# Patient Record
Sex: Male | Born: 1935 | Race: White | Hispanic: No | Smoking: Former smoker
Health system: Southern US, Community
[De-identification: ages and names within clinical notes are randomized; demographics above are authoritative.]

## PROBLEM LIST (undated history)

## (undated) DIAGNOSIS — I2510 Atherosclerotic heart disease of native coronary artery without angina pectoris: Secondary | ICD-10-CM

## (undated) DIAGNOSIS — M1712 Unilateral primary osteoarthritis, left knee: Secondary | ICD-10-CM

## (undated) DIAGNOSIS — R279 Unspecified lack of coordination: Secondary | ICD-10-CM

## (undated) DIAGNOSIS — I739 Peripheral vascular disease, unspecified: Secondary | ICD-10-CM

## (undated) DIAGNOSIS — Z87442 Personal history of urinary calculi: Secondary | ICD-10-CM

## (undated) DIAGNOSIS — K5900 Constipation, unspecified: Secondary | ICD-10-CM

## (undated) DIAGNOSIS — C44211 Basal cell carcinoma of skin of unspecified ear and external auricular canal: Secondary | ICD-10-CM

## (undated) DIAGNOSIS — Z96659 Presence of unspecified artificial knee joint: Principal | ICD-10-CM

## (undated) DIAGNOSIS — R200 Anesthesia of skin: Secondary | ICD-10-CM

## (undated) DIAGNOSIS — E039 Hypothyroidism, unspecified: Secondary | ICD-10-CM

## (undated) DIAGNOSIS — F4323 Adjustment disorder with mixed anxiety and depressed mood: Secondary | ICD-10-CM

## (undated) DIAGNOSIS — R413 Other amnesia: Secondary | ICD-10-CM

## (undated) DIAGNOSIS — R269 Unspecified abnormalities of gait and mobility: Secondary | ICD-10-CM

## (undated) DIAGNOSIS — F4321 Adjustment disorder with depressed mood: Secondary | ICD-10-CM

## (undated) DIAGNOSIS — D62 Acute posthemorrhagic anemia: Secondary | ICD-10-CM

## (undated) DIAGNOSIS — E7800 Pure hypercholesterolemia, unspecified: Secondary | ICD-10-CM

## (undated) DIAGNOSIS — R531 Weakness: Secondary | ICD-10-CM

## (undated) DIAGNOSIS — R42 Dizziness and giddiness: Secondary | ICD-10-CM

## (undated) DIAGNOSIS — I69319 Unspecified symptoms and signs involving cognitive functions following cerebral infarction: Secondary | ICD-10-CM

## (undated) DIAGNOSIS — E785 Hyperlipidemia, unspecified: Secondary | ICD-10-CM

## (undated) DIAGNOSIS — R5383 Other fatigue: Secondary | ICD-10-CM

## (undated) DIAGNOSIS — I10 Essential (primary) hypertension: Secondary | ICD-10-CM

## (undated) HISTORY — DX: Peripheral vascular disease, unspecified: I73.9

## (undated) HISTORY — DX: Essential (primary) hypertension: I10

## (undated) HISTORY — DX: Dizziness and giddiness: R42

## (undated) HISTORY — DX: Weakness: R53.1

## (undated) HISTORY — DX: Atherosclerotic heart disease of native coronary artery without angina pectoris: I25.10

## (undated) HISTORY — DX: Other amnesia: R41.3

## (undated) HISTORY — DX: Unspecified abnormalities of gait and mobility: R26.9

## (undated) HISTORY — DX: Unspecified symptoms and signs involving cognitive functions following cerebral infarction: I69.319

## (undated) HISTORY — DX: Hypothyroidism, unspecified: E03.9

## (undated) HISTORY — DX: Adjustment disorder with depressed mood: F43.21

## (undated) HISTORY — PX: CARPAL TUNNEL RELEASE: SHX101

## (undated) HISTORY — PX: CYSTOSCOPY: SHX5120

## (undated) HISTORY — DX: Pure hypercholesterolemia, unspecified: E78.00

## (undated) HISTORY — DX: Acute posthemorrhagic anemia: D62

## (undated) HISTORY — DX: Other fatigue: R53.83

## (undated) HISTORY — DX: Unilateral primary osteoarthritis, left knee: M17.12

## (undated) HISTORY — DX: Hyperlipidemia, unspecified: E78.5

## (undated) HISTORY — PX: TONSILLECTOMY: SHX5217

## (undated) HISTORY — DX: Basal cell carcinoma of skin of unspecified ear and external auricular canal: C44.211

## (undated) HISTORY — PX: TONSILLECTOMY: SUR1361

## (undated) HISTORY — DX: Anesthesia of skin: R20.0

## (undated) HISTORY — DX: Constipation, unspecified: K59.00

## (undated) HISTORY — DX: Unspecified lack of coordination: R27.9

## (undated) HISTORY — DX: Adjustment disorder with mixed anxiety and depressed mood: F43.23

## (undated) HISTORY — DX: Presence of unspecified artificial knee joint: Z96.659

---

## 1998-09-07 ENCOUNTER — Encounter: Payer: Self-pay | Admitting: Urology

## 1998-09-08 ENCOUNTER — Observation Stay (HOSPITAL_COMMUNITY): Admission: RE | Admit: 1998-09-08 | Discharge: 1998-09-09 | Payer: Self-pay | Admitting: Urology

## 1999-12-07 ENCOUNTER — Encounter: Admission: RE | Admit: 1999-12-07 | Discharge: 2000-03-06 | Payer: Self-pay | Admitting: Radiation Oncology

## 2000-02-28 ENCOUNTER — Ambulatory Visit (HOSPITAL_COMMUNITY): Admission: RE | Admit: 2000-02-28 | Discharge: 2000-02-28 | Payer: Self-pay | Admitting: Radiation Oncology

## 2000-03-07 ENCOUNTER — Encounter: Admission: RE | Admit: 2000-03-07 | Discharge: 2000-06-05 | Payer: Self-pay | Admitting: Radiation Oncology

## 2000-10-02 HISTORY — PX: CHOLECYSTECTOMY: SHX55

## 2001-05-14 ENCOUNTER — Emergency Department (HOSPITAL_COMMUNITY): Admission: EM | Admit: 2001-05-14 | Discharge: 2001-05-14 | Payer: Self-pay | Admitting: Emergency Medicine

## 2001-05-14 ENCOUNTER — Encounter: Payer: Self-pay | Admitting: Emergency Medicine

## 2001-05-17 ENCOUNTER — Encounter (INDEPENDENT_AMBULATORY_CARE_PROVIDER_SITE_OTHER): Payer: Self-pay | Admitting: *Deleted

## 2001-05-18 ENCOUNTER — Inpatient Hospital Stay (HOSPITAL_COMMUNITY): Admission: RE | Admit: 2001-05-18 | Discharge: 2001-05-19 | Payer: Self-pay | Admitting: *Deleted

## 2001-10-02 HISTORY — PX: CORONARY ARTERY BYPASS GRAFT: SHX141

## 2002-06-09 ENCOUNTER — Encounter (HOSPITAL_COMMUNITY): Admission: RE | Admit: 2002-06-09 | Discharge: 2002-09-07 | Payer: Self-pay | Admitting: Cardiovascular Disease

## 2007-09-19 ENCOUNTER — Ambulatory Visit (HOSPITAL_COMMUNITY): Admission: RE | Admit: 2007-09-19 | Discharge: 2007-09-19 | Payer: Self-pay | Admitting: General Surgery

## 2007-09-19 ENCOUNTER — Encounter (INDEPENDENT_AMBULATORY_CARE_PROVIDER_SITE_OTHER): Payer: Self-pay | Admitting: General Surgery

## 2007-09-19 HISTORY — PX: INGUINAL HERNIA REPAIR: SUR1180

## 2009-12-13 ENCOUNTER — Ambulatory Visit (HOSPITAL_BASED_OUTPATIENT_CLINIC_OR_DEPARTMENT_OTHER): Admission: RE | Admit: 2009-12-13 | Discharge: 2009-12-13 | Payer: Self-pay | Admitting: Otolaryngology

## 2009-12-14 HISTORY — PX: EAR CYST EXCISION: SHX22

## 2010-01-14 ENCOUNTER — Ambulatory Visit (HOSPITAL_COMMUNITY): Admission: RE | Admit: 2010-01-14 | Discharge: 2010-01-14 | Payer: Self-pay | Admitting: Otolaryngology

## 2010-01-14 HISTORY — PX: BASAL CELL CARCINOMA EXCISION: SHX1214

## 2010-06-08 ENCOUNTER — Ambulatory Visit: Payer: Self-pay | Admitting: Cardiovascular Disease

## 2010-12-13 ENCOUNTER — Ambulatory Visit (INDEPENDENT_AMBULATORY_CARE_PROVIDER_SITE_OTHER): Payer: Medicare Other | Admitting: Cardiovascular Disease

## 2010-12-13 DIAGNOSIS — I10 Essential (primary) hypertension: Secondary | ICD-10-CM

## 2010-12-13 DIAGNOSIS — Z951 Presence of aortocoronary bypass graft: Secondary | ICD-10-CM

## 2010-12-13 DIAGNOSIS — E7800 Pure hypercholesterolemia, unspecified: Secondary | ICD-10-CM

## 2010-12-13 DIAGNOSIS — I2510 Atherosclerotic heart disease of native coronary artery without angina pectoris: Secondary | ICD-10-CM

## 2010-12-21 LAB — BASIC METABOLIC PANEL
BUN: 21 mg/dL (ref 6–23)
CO2: 26 mEq/L (ref 19–32)
Calcium: 9.2 mg/dL (ref 8.4–10.5)
Chloride: 107 mEq/L (ref 96–112)
Creatinine, Ser: 0.89 mg/dL (ref 0.4–1.5)
GFR calc Af Amer: >60 mL/min (ref >60)
GFR calc non Af Amer: >60 mL/min (ref >60)
Glucose, Bld: 114 mg/dL — ABNORMAL HIGH (ref 70–99)
Potassium: 4.4 mEq/L (ref 3.5–5.1)
Sodium: 139 mEq/L (ref 135–145)

## 2010-12-21 LAB — CBC
HCT: 40.7 % (ref 39.0–52.0)
Hemoglobin: 14.1 g/dL (ref 13.0–17.0)
MCHC: 34.7 g/dL (ref 30.0–36.0)
MCV: 95.8 fL (ref 78.0–100.0)
RBC: 4.25 MIL/uL (ref 4.22–5.81)
RDW: 13.1 % (ref 11.5–15.5)

## 2010-12-22 ENCOUNTER — Telehealth: Payer: Self-pay | Admitting: Cardiovascular Disease

## 2010-12-22 NOTE — Telephone Encounter (Signed)
Returned pts call regarding his last glucose and cholesterol results.  RN left this info on his answering machine per his request.

## 2010-12-22 NOTE — Telephone Encounter (Signed)
Patient called and requested last glucose result and last cholesterol result.  OK to leave message on answering machine, patient may be out.

## 2011-01-17 ENCOUNTER — Other Ambulatory Visit: Payer: Self-pay | Admitting: Cardiovascular Disease

## 2011-01-17 DIAGNOSIS — E785 Hyperlipidemia, unspecified: Secondary | ICD-10-CM

## 2011-01-17 MED ORDER — PRAVASTATIN SODIUM 40 MG PO TABS: 40.0000 mg | ORAL_TABLET | Freq: Every day | ORAL | Status: DC

## 2011-01-17 NOTE — Telephone Encounter (Signed)
Spoke with pt, new strength ordered.Patient request refill. Completed. Alfonso Ramus RN

## 2011-01-17 NOTE — Telephone Encounter (Signed)
PT NEEDING TO REFILL A MED BUT HAS QUESTIONS ABOUT THE DOSAGE AND HAVING ENOUGH. CHART PLACED IN BOX.

## 2011-02-14 NOTE — Op Note (Signed)
NAMEJAHRON, HUNSINGER              ACCOUNT NO.:  [id number]   MEDICAL RECORD NO.:  [id number]          PATIENT TYPE:  AMB   LOCATION:  SDS                          FACILITY:  [facility]   PHYSICIAN:  Ollen Gross. Vernell Morgans, M.D. DATE OF BIRTH:  Apr 09, 1921   DATE OF PROCEDURE:  09/19/2007  DATE OF DISCHARGE:                               OPERATIVE REPORT   PREOPERATIVE DIAGNOSES:  Right inguinal hernia.   POSTOPERATIVE DIAGNOSIS:  Right indirect inguinal hernia.   PROCEDURE:  Right inguinal hernia repair with mesh.   SURGEON:  Ollen Gross. Vernell Morgans, M.D.   ANESTHESIA:  General via LMA.   PROCEDURE:  After informed consent was obtained, the patient brought to  the operating room, placed in supine position on operating room table.  After adequate induction of general anesthesia, the patient's abdomen  and right groin were prepped with Betadine and draped in usual sterile  fashion.  The right groin was then infiltrated with 0.25% Marcaine with  epinephrine.  A small incision was made from the edge of pubic tubercle  on the right towards the anterior superior iliac spine.  This incision  was carried down through the skin and subcutaneous tissue sharply with  electrocautery until the fascia of the external oblique was encountered.  Fascia of the external oblique was opened along its fibers towards the  apex of the external ring with a 10 blade knife and Metzenbaum scissors.  A Weitlaner retractor was then deployed.  Blunt dissection was then  carried out of the cord structures until they could be surrounded  between two fingers.  A 1/2-inch Penrose drain was placed around the  cord structures for retraction purposes.  There was did not appear to be  a direct defect in the floor of the canal.  The cord structures were  then gently skeletonized, until a hernia sac was identified.  It was  gently separated from the rest of the cord structures.  The sac was then  opened.  There were no visceral contents  within the sac.  The sac was  ligated at its base with a 2-0 silk suture ligature and the stump of the  sac was allowed to retract back beneath the transversalis.  The patient  also a moderate sized lipoma that was separated from the rest of cord  structures sharply with electrocautery.  Next the ilioinguinal nerve was  also identified and was clamped proximally and distally with hemostats,  divided and ligated with 3-0 silk ties.  Next a 3 x 6 piece of Ultrapro  mesh was chosen and cut to fit.  The mesh was sewed inferiorly to the  shelving edge of inguinal ligament with a running 2-0 Prolene stitch.  The mesh was sewed superiorly to the muscular aponeurotic strength layer  of the transversalis.  Tails were cut in the mesh laterally and tails of  mesh were wrapped around the cord structures and anchored lateral to the  cord, to the shelving edge of inguinal ligament interrupted 2-0 Prolene  stitch.  Once this was accomplished, the mesh was in good position  without any  tension.  The wound was irrigated copious amounts of saline.  The external oblique was then reapproximated a running 2-0 Vicryl  stitch.  The wound was infiltrated with the rest of the 0.25% Marcaine.  The subcutaneous fascia was closed with a running 3-0 Vicryl stitch and  the skin was closed with a running 4-0 Monocryl subcuticular stitch.  A  Dermabond dressing was then applied.  The patient tolerated procedure  well.  At the end the case all needle, sponge, instrument counts  correct.  The patient was awakened and taken recovery in stable  condition.  The patient's testicle was in the scrotum at the end the  case.      Ollen Gross. Vernell Morgans, M.D.  Electronically Signed     PST/MEDQ  D:  09/19/2007  T:  09/19/2007  Job:  [id number]

## 2011-02-17 NOTE — Discharge Summary (Signed)
[place]  Patient:    William Roach, William Roach Visit Number: [id number] MRN: [id number]          Service Type: SUR Location: [id number] Attending Physician:  Vikki Ports. Adm. Date:  [id number] Disc. Date: [id number]                             Discharge Summary  ADMISSION DIAGNOSES:  Acute cholecystitis.  DISCHARGE DIAGNOSIS:  Acute gangrenous cholecystitis.  ADMITTING PHYSICIAN:  Vikki Ports, M.D.  CONDITION ON DISCHARGE:  Good and improved.  DISPOSITION:  Discharge to home.  FOLLOWUP:  Follow up with me in 5 days.  BRIEF HISTORY OF PRESENT ILLNESS: The patient is a 77 year old white male with a 5 day history of worsening right upper quadrant abdominal pain and low-grade fever.  Ultrasound showed a thick walled gallbladder.  He was seen in the office 2 days prior to admission.  The patient was quite tender and admitted for laparoscopic cholecystectomy.  For the remainder of the H&P please see the chart.  HOSPITAL COURSE:  The patient was admitted, taken to the operating room where he underwent laparoscopic cholecystectomy for acute gangrenous cholecystitis and placement of a JP drain.  Postoperatively he spiked a temperature to 102 and was maintained on IV antibiotics for 48 hours.  On postoperative day #2, he had remained afebrile for 36 hours, was tolerating a regular diet, feeling well and anxious to go home.  DISCHARGE MEDICATIONs: 1. Mepergan Fortis 1-2 p.o. q.4-6h. p.r.n. 2. Cipro 500 mg p.o. b.i.d. for 3 days. DD:  05/19/01 TD:  05/20/01 Job: [id number]

## 2011-02-17 NOTE — Op Note (Signed)
[place]. [facility]  Patient:    SUMMER, PARTHASARATHY                               Visit Number: [id number] MRN: [id number]          Service Type: DSU Location: 5700 [id number] Attending:  Vikki Ports Proc. Date: 05/17/01 Adm. Date:  [id number]                             Operative Report  PREOPERATIVE DIAGNOSIS:  Acute cholecystitis.  POSTOPERATIVE DIAGNOSIS:  Acute gangrenous cholecystitis.  PROCEDURE:  Laparoscopic cholecystectomy.  SURGEON:  Vikki Ports, M.D.  ASSISTANT:  Zigmund Daniel, M.D.  ANESTHESIA:  General anesthesia, endotracheal tube.  DESCRIPTION OF PROCEDURE:  Patient was taken to the operating room and placed in a supine position.  After adequate anesthesia was induced using endotracheal tube, the abdomen was prepped and draped in the normal sterile fashion.  Using a transverse infraumbilical incision, I dissected down to the fascia, which was opened vertically.  An 0 Vicryl pursestring suture was placed around the fascial defect, and the Hasson trocar was placed in the abdomen.  Abdomen was insufflated with carbon dioxide to a pressure of 15 mmHg. There was a large number of omental adhesions to the anterior abdominal wall. There was what appeared to be bilious fluid in the right upper quadrant.  The gallbladder was identified, was grossly necrotic at the fundus and gangrenous throughout with marked inflammatory changes.  Under direct visualization, a 10 mm port was placed in the subxiphoid region, two 5 mm ports were placed in the right abdomen.  The gallbladder was retracted cephalad.  The gallbladder, particularly the fundus, shredded while being grasped and maneuvered.  Tedious dissection was taken down near the fatty planes of the neck of the gallbladder, duodenum was freed up.  The cystic duct, which was very thin and friable, was doubly clipped; however, I do not feel the clips were very securely placed.   However, on further attempts to grasp the cystic duct, it continued to be quite friable and I felt it prudent not to continue manipulation.  Cystic artery was identified, dissected free, triply clipped and divided.  It was difficult dissection remove the gallbladder off the gallbladder bed, but it was accomplished with Bovie electrocautery.  There was significant bleeding from the liver bed, which was coagulated, and Surgicel was placed.  The gallbladder was placed in an Endocatch bag and removed to the umbilical port.  A 19 Blake drain was placed down near the cystic duct and brought out through a lateral side port.  Abdomen was allowed to deflate after the right upper quadrant was copiously irrigated.  Fascial defects were closed with 0 Vicryl pursestring suture.  Skin incisions were closed with subcuticular 4-0 Monocryl.  Steri-Strips, sterile dressings were applied.  The patient tolerated the procedure well and went to PACU in good condition. DD:  05/17/01 TD:  05/17/01 Job: [id number]

## 2011-04-12 ENCOUNTER — Other Ambulatory Visit: Payer: Self-pay | Admitting: Family Medicine

## 2011-06-22 ENCOUNTER — Encounter: Payer: Self-pay | Admitting: *Deleted

## 2011-06-28 ENCOUNTER — Ambulatory Visit (INDEPENDENT_AMBULATORY_CARE_PROVIDER_SITE_OTHER): Payer: Medicare Other | Admitting: *Deleted

## 2011-06-28 DIAGNOSIS — I2510 Atherosclerotic heart disease of native coronary artery without angina pectoris: Secondary | ICD-10-CM

## 2011-06-28 DIAGNOSIS — I10 Essential (primary) hypertension: Secondary | ICD-10-CM

## 2011-06-28 DIAGNOSIS — R5383 Other fatigue: Secondary | ICD-10-CM

## 2011-06-28 DIAGNOSIS — E7800 Pure hypercholesterolemia, unspecified: Secondary | ICD-10-CM

## 2011-06-28 LAB — BASIC METABOLIC PANEL
BUN: 23 mg/dL (ref 6–23)
CO2: 29 mEq/L (ref 19–32)
Calcium: 9.2 mg/dL (ref 8.4–10.5)
Creatinine, Ser: 0.9 mg/dL (ref 0.4–1.5)
Glucose, Bld: 98 mg/dL (ref 70–99)
Sodium: 142 mEq/L (ref 135–145)

## 2011-06-28 LAB — HEPATIC FUNCTION PANEL
AST: 27 U/L (ref 0–37)
Bilirubin, Direct: 0.1 mg/dL (ref 0.0–0.3)
Total Bilirubin: 0.9 mg/dL (ref 0.3–1.2)

## 2011-06-28 LAB — LIPID PANEL
HDL: 43.1 mg/dL (ref >39.00)
Total CHOL/HDL Ratio: 3

## 2011-07-04 ENCOUNTER — Encounter: Payer: Self-pay | Admitting: Cardiovascular Disease

## 2011-07-04 ENCOUNTER — Ambulatory Visit (INDEPENDENT_AMBULATORY_CARE_PROVIDER_SITE_OTHER): Payer: Medicare Other | Admitting: Cardiovascular Disease

## 2011-07-04 DIAGNOSIS — E785 Hyperlipidemia, unspecified: Secondary | ICD-10-CM | POA: Insufficient documentation

## 2011-07-04 DIAGNOSIS — I2510 Atherosclerotic heart disease of native coronary artery without angina pectoris: Secondary | ICD-10-CM

## 2011-07-04 DIAGNOSIS — I10 Essential (primary) hypertension: Secondary | ICD-10-CM

## 2011-07-04 HISTORY — DX: Essential (primary) hypertension: I10

## 2011-07-04 HISTORY — DX: Atherosclerotic heart disease of native coronary artery without angina pectoris: I25.10

## 2011-07-04 HISTORY — DX: Hyperlipidemia, unspecified: E78.5

## 2011-07-04 NOTE — Assessment & Plan Note (Signed)
His blood pressure remains very  well controlled. He'll continue to watch it we'll check each week.

## 2011-07-04 NOTE — Progress Notes (Signed)
Karen Kitchens Date of Birth  Aug 13, 1921 [facility] [address]    [address] [place], [place]  [address] [phone]  Fax  [phone]  History of Present Illness:  77 year old gentleman with history history of coronary artery disease-status post coronary artery bypass grafting. He also has a history of hypertension, hyperlipidemia.  Current Outpatient Prescriptions on File Prior to Visit  Medication Sig Dispense Refill  . Calcium Carbonate-Vit D-Min (CALTRATE PLUS PO) Take 1 tablet by mouth daily.        . Glucosamine-Chondroitin (OSTEO BI-FLEX REGULAR STRENGTH PO) Take 1 tablet by mouth 2 (two) times daily.        Marland Kitchen ibuprofen (ADVIL) 200 MG tablet Take 200 mg by mouth as needed.        . multivitamin (THERAGRAN) per tablet Take 1 tablet by mouth daily.        . pravastatin (PRAVACHOL) 40 MG tablet Take 1 tablet (40 mg total) by mouth daily.  90 tablet  3  . vitamin C (ASCORBIC ACID) 500 MG tablet Take 1,000 mg by mouth daily.          No Known Allergies  Past Medical History  Diagnosis Date  . Coronary artery disease     post CABG x4   . Hypertension   . Hypercholesterolemia   . Fatigue   . Dizziness   . Basal cell carcinoma, ear     post -- partial pinnectomy left side with primary closure      Past Surgical History  Procedure Date  . Coronary artery bypass graft 2003    x4 --   . Cholecystectomy 2002  . Inguinal hernia repair 09/19/2007  . Ear cyst excision 12/14/2009    Excision of left auricular cyst with primary closure  . Basal cell carcinoma excision 01/14/2010    Wedge resection, partial pinnectomy left side with primary closure      . Tonsillectomy   . Carpal tunnel release     History  Smoking status  . Former Smoker  . Quit date: 06/21/1981  Smokeless tobacco  . Not on file    History  Alcohol Use No    Family History  Problem Relation Age of Onset  . Parkinsonism Father   . Angina Mother   . Coronary artery disease Brother       with CABG    Reviw of Systems:  Reviewed in the HPI.  All other systems are negative.  Physical Exam: BP 130/80  Pulse 68  Ht 5\' 7"  (1.702 m)  Wt 155 lb 1.9 oz (70.362 kg)  BMI 24.30 kg/m2 The patient is alert and oriented x 3.  The mood and affect are normal.   Skin: warm and dry.  Color is normal.    HEENT:   the sclera are nonicteric.  The mucous membranes are moist.  The carotids are 2+ without bruits.  There is no thyromegaly.  There is no JVD.    Lungs: clear.  The chest wall is non tender.    Heart: regular rate with a normal S1 and S2.  There are no murmurs, gallops, or rubs. The PMI is not displaced.     Abdomen: good bowel sounds.  There is no guarding or rebound.  There is no hepatosplenomegaly or tenderness.  There are no masses.   Extremities:  no clubbing, cyanosis, or edema.  The legs are without rashes.  The distal pulses are intact.   Neuro:  Cranial nerves II - XII  are intact.  Motor and sensory functions are intact.    The gait is normal.  ECG:  Assessment / Plan:

## 2011-07-04 NOTE — Patient Instructions (Signed)
Your physician wants you to follow-up in:6 months You will receive a reminder letter in the mail two months in advance. If you don't receive a letter, please call our office to schedule the follow-up appointment.  Your physician recommends that you continue on your current medications as directed. Please refer to the Current Medication list given to you today.   Your physician recommends that you return for lab work in: 6 months/ fasting labs

## 2011-07-04 NOTE — Assessment & Plan Note (Signed)
He has done very well. He's not had any episodes of chest pain or shortness of breath.

## 2011-07-04 NOTE — Assessment & Plan Note (Signed)
We'll see him again in 6 months for an office visit and fasting lipid profile, H&P, and basic metabolic profile.

## 2011-07-07 LAB — BASIC METABOLIC PANEL
CO2: 31
Chloride: 102
Creatinine, Ser: 1
GFR calc Af Amer: >60
Glucose, Bld: 73
Sodium: 138

## 2011-07-07 LAB — CBC
Hemoglobin: 14.3
MCHC: 34
MCV: 93.2
RBC: 4.53

## 2011-12-09 ENCOUNTER — Other Ambulatory Visit: Payer: Self-pay | Admitting: Cardiovascular Disease

## 2012-01-02 ENCOUNTER — Encounter: Payer: Self-pay | Admitting: Cardiovascular Disease

## 2012-01-02 ENCOUNTER — Ambulatory Visit (INDEPENDENT_AMBULATORY_CARE_PROVIDER_SITE_OTHER): Payer: Medicare Other | Admitting: Cardiovascular Disease

## 2012-01-02 VITALS — BP 125/65 | HR 68 | Ht 68.0 in | Wt 151.8 lb

## 2012-01-02 DIAGNOSIS — I2510 Atherosclerotic heart disease of native coronary artery without angina pectoris: Secondary | ICD-10-CM

## 2012-01-02 DIAGNOSIS — E785 Hyperlipidemia, unspecified: Secondary | ICD-10-CM

## 2012-01-02 DIAGNOSIS — R9431 Abnormal electrocardiogram [ECG] [EKG]: Secondary | ICD-10-CM

## 2012-01-02 DIAGNOSIS — I10 Essential (primary) hypertension: Secondary | ICD-10-CM

## 2012-01-02 NOTE — Assessment & Plan Note (Signed)
William Roach feels fairly well. His EKG today shows T-wave inversions in the inferior and lateral leads. These changes were not present during his previous EKG. Has a history of coronary artery bypass grafting in 2001.  We'll schedule him for a [employer] study. We have not done a Myoview study since his bypass surgery.  I'll see him again in 6 months for a followup visit.

## 2012-01-02 NOTE — Progress Notes (Signed)
William Roach Date of Birth  1921/09/04  [facility] [address]    [address] [place], [place]  [address] [phone]  Fax  [phone]  Problems:  1. Coronary artery disease-status post CABG 2. Hypertension  3. Hypercholesterolemia 4. Fatigue  History of  Present Illness:  78 year old gentleman with history history of coronary artery disease-status post coronary artery bypass grafting. He also has a history of hypertension, hyperlipidemia.  William Roach has not had any significant problems. He notes that his blood pressure is elevated today. He typically is in the normal range.  Current Outpatient Prescriptions on File Prior to Visit  Medication Sig Dispense Refill  . aspirin 81 MG tablet Take 81 mg by mouth daily.        . Calcium Carbonate-Vit D-Min (CALTRATE PLUS PO) Take 1 tablet by mouth daily.        . Glucosamine-Chondroitin (OSTEO BI-FLEX REGULAR STRENGTH PO) Take 1 tablet by mouth 2 (two) times daily.        Marland Kitchen ibuprofen (ADVIL) 200 MG tablet Take 200 mg by mouth as needed.        Marland Kitchen levothyroxine (SYNTHROID, LEVOTHROID) 50 MCG tablet Take 50 mcg by mouth daily.       . multivitamin (THERAGRAN) per tablet Take 1 tablet by mouth daily.        . pravastatin (PRAVACHOL) 40 MG tablet TAKE 1 TABLET DAILY (DOSE INCREASE)  90 tablet  2  . vitamin C (ASCORBIC ACID) 500 MG tablet Take 1,000 mg by mouth daily.          No Known Allergies  Past Medical History  Diagnosis Date  . Coronary artery disease     post CABG x4   . Hypertension   . Hypercholesterolemia   . Fatigue   . Dizziness   . Basal cell carcinoma, ear     post -- partial pinnectomy left side with primary closure      Past Surgical History  Procedure Date  . Coronary artery bypass graft 2003    x4 --   . Cholecystectomy 2002  . Inguinal hernia repair 09/19/2007  . Ear cyst excision 12/14/2009    Excision of left auricular cyst with primary closure  . Basal cell carcinoma excision 01/14/2010    Wedge  resection, partial pinnectomy left side with primary closure      . Tonsillectomy   . Carpal tunnel release     History  Smoking status  . Former Smoker  . Quit date: 06/21/1981  Smokeless tobacco  . Not on file    History  Alcohol Use No    Family History  Problem Relation Age of Onset  . Parkinsonism Father   . Angina Mother   . Coronary artery disease Brother     with CABG    Reviw of Systems:  Reviewed in the HPI.  All other systems are negative.  Physical Exam: BP 149/95  Pulse 68  Ht 5\' 8"  (1.727 m)  Wt 151 lb 12.8 oz (68.856 kg)  BMI 23.08 kg/m2 The patient is alert and oriented x 3.  The mood and affect are normal.   Skin: warm and dry.  Color is normal.    HEENT:   the sclera are nonicteric.  The mucous membranes are moist.  The carotids are 2+ without bruits.  There is no thyromegaly.  There is no JVD.    Lungs: clear.  The chest wall is non tender.    Heart: regular rate with a  normal S1 and S2.  There are no murmurs, gallops, or rubs. The PMI is not displaced.     Abdomen: good bowel sounds.  There is no guarding or rebound.  There is no hepatosplenomegaly or tenderness.  There are no masses.   Extremities:  no clubbing, cyanosis, or edema.  The legs are without rashes.  The distal pulses are intact.   Neuro:  Cranial nerves II - XII are intact.  Motor and sensory functions are intact.    The gait is normal.  ECG: 01/02/2012. Normal sinus rhythm. He has T-wave inversions in the inferior and lateral leads.  The TWI is new from his previous tracing  Assessment / Plan:

## 2012-01-02 NOTE — Patient Instructions (Signed)
Your physician wants you to follow-up in: 6 MONTHS You will receive a reminder letter in the mail two months in advance. If you don't receive a letter, please call our office to schedule the follow-up appointment.   Your physician recommends that you return for a FASTING lipid profile: PLEASE SCHEDULE FOR THE DAY HE COMES FOR LEXISCAN SINCE HE WILL BE FASTING. LAB ORDERS IN SYSTEM FROM PRIOR.   Your physician has requested that you have a lexiscan myoview.  Please follow instruction sheet, as given.

## 2012-01-09 ENCOUNTER — Other Ambulatory Visit (INDEPENDENT_AMBULATORY_CARE_PROVIDER_SITE_OTHER): Payer: Medicare Other

## 2012-01-09 ENCOUNTER — Ambulatory Visit (HOSPITAL_COMMUNITY): Payer: Medicare Other | Attending: Cardiovascular Disease | Admitting: Radiology

## 2012-01-09 VITALS — BP 145/80 | Ht 68.0 in | Wt 152.0 lb

## 2012-01-09 DIAGNOSIS — E785 Hyperlipidemia, unspecified: Secondary | ICD-10-CM | POA: Insufficient documentation

## 2012-01-09 DIAGNOSIS — Z8249 Family history of ischemic heart disease and other diseases of the circulatory system: Secondary | ICD-10-CM | POA: Insufficient documentation

## 2012-01-09 DIAGNOSIS — R9431 Abnormal electrocardiogram [ECG] [EKG]: Secondary | ICD-10-CM | POA: Insufficient documentation

## 2012-01-09 DIAGNOSIS — I4949 Other premature depolarization: Secondary | ICD-10-CM

## 2012-01-09 DIAGNOSIS — Z951 Presence of aortocoronary bypass graft: Secondary | ICD-10-CM | POA: Insufficient documentation

## 2012-01-09 DIAGNOSIS — I2510 Atherosclerotic heart disease of native coronary artery without angina pectoris: Secondary | ICD-10-CM

## 2012-01-09 DIAGNOSIS — Z87891 Personal history of nicotine dependence: Secondary | ICD-10-CM | POA: Insufficient documentation

## 2012-01-09 DIAGNOSIS — I10 Essential (primary) hypertension: Secondary | ICD-10-CM

## 2012-01-09 DIAGNOSIS — R42 Dizziness and giddiness: Secondary | ICD-10-CM | POA: Insufficient documentation

## 2012-01-09 DIAGNOSIS — R0602 Shortness of breath: Secondary | ICD-10-CM

## 2012-01-09 DIAGNOSIS — R5381 Other malaise: Secondary | ICD-10-CM | POA: Insufficient documentation

## 2012-01-09 LAB — HEPATIC FUNCTION PANEL
ALT: 20 U/L (ref 0–53)
AST: 24 U/L (ref 0–37)
Alkaline Phosphatase: 90 U/L (ref 39–117)
Bilirubin, Direct: 0.2 mg/dL (ref 0.0–0.3)
Total Bilirubin: 0.7 mg/dL (ref 0.3–1.2)

## 2012-01-09 LAB — BASIC METABOLIC PANEL
BUN: 26 mg/dL — ABNORMAL HIGH (ref 6–23)
Calcium: 9.4 mg/dL (ref 8.4–10.5)
GFR: 83.43 mL/min (ref >60.00)
Glucose, Bld: 94 mg/dL (ref 70–99)

## 2012-01-09 MED ORDER — TECHNETIUM TC 99M TETROFOSMIN IV KIT
11.0000 | PACK | Freq: Once | INTRAVENOUS | Status: AC | PRN
  Administered 2012-01-09: 11 via INTRAVENOUS

## 2012-01-09 MED ORDER — REGADENOSON 0.4 MG/5ML IV SOLN
0.4000 mg | Freq: Once | INTRAVENOUS | Status: AC
  Administered 2012-01-09: 0.4 mg via INTRAVENOUS

## 2012-01-09 MED ORDER — TECHNETIUM TC 99M TETROFOSMIN IV KIT
33.0000 | PACK | Freq: Once | INTRAVENOUS | Status: AC | PRN
  Administered 2012-01-09: 33 via INTRAVENOUS

## 2012-01-09 NOTE — Progress Notes (Signed)
[facility] SITE 3 NUCLEAR MED [address] [place] [address] [phone]  Cardiology Nuclear Med Study  William Roach is a 78 y.o. male     MRN : [id number]     DOB: 07-22-1921  Procedure Date: 01/09/2012  Nuclear Med Background Indication for Stress Test:  Evaluation for Ischemia, Graft Patency and Abnormal EKG:new T wave inversions inferior/lateral leads History:  '01 CABG x 4([place]) and Heart Catherization(Patient stated sometime after cath in [place]) Cardiac Risk Factors: Family History - CAD, History of Smoking, Hypertension and Lipids  Symptoms:  Dizziness and Fatigue   Nuclear Pre-Procedure Caffeine/Decaff Intake:  None NPO After: 7:00pm   Lungs:  clear O2 Sat: 95% on RA IV 0.9% NS with Angio Cath:  20g  IV Site: R Wrist  IV Started by:  Cathlyn Parsons, RN  Chest Size (in):  36 Cup Size: n/a  Height: 5\' 8"  (1.727 m)  Weight:  152 lb (68.947 kg)  BMI:  Body mass index is 23.11 kg/(m^2). Tech Comments:  n/a    Nuclear Med Study 1 or 2 day study: 1 day  Stress Test Type:  Treadmill/Lexiscan  Reading MD: Charlton Haws, MD  Order Authorizing Provider:  Jannette Spanner  Resting Radionuclide: Technetium 62m Tetrofosmin  Resting Radionuclide Dose: 11.0 mCi   Stress Radionuclide:  Technetium 13m Tetrofosmin  Stress Radionuclide Dose: 33.0 mCi           Stress Protocol Rest HR: 74 Stress HR: 93  Rest BP: 147/75 Stress BP: 132/50  Exercise Time (min): 2:00 METS: 1.6   Predicted Max HR: 132 bpm % Max HR: 70.45 bpm Rate Pressure Product: 78469   Dose of Adenosine (mg):  n/a Dose of Lexiscan: 0.4 mg  Dose of Atropine (mg): n/a Dose of Dobutamine: n/a mcg/kg/min (at max HR)  Stress Test Technologist: Cathlyn Parsons, RN  Nuclear Technologist:  Domenic Polite, CNMT     Rest Procedure:  Myocardial perfusion imaging was performed at rest 45 minutes following the intravenous administration of Technetium 36m Tetrofosmin. Rest ECG: NSR  with frequent PVC's and T wave abnormality  Stress Procedure:  The patient received IV Lexiscan 0.4 mg over 15-seconds with concurrent low level exercise and then Technetium 58m Tetrofosmin was injected at 30-seconds while the patient continued walking one more minute. There were no significant changes with Lexiscan. Patient had frequent PVC's with couplets and bigeminy.Quantitative spect images were obtained after a 45-minute delay. Stress ECG: No significant change from baseline ECG  QPS Raw Data Images:  Patient motion noted. Stress Images:  Decreased inferobasal uptake Rest Images:  Decreased inferobasal uptake Subtraction (SDS):  There is a fixed defect that is most consistent with a previous infarction. Transient Ischemic Dilatation (Normal <1.22):  1.09 Lung/Heart Ratio (Normal <0.45):  0.34  Quantitative Gated Spect Images QGS EDV: NA QGS ESV:  NA  Impression Exercise Capacity:  Lexiscan with low level exercise. BP Response:  Normal blood pressure response. Clinical Symptoms:  No chest pain. ECG Impression:  PVC's Comparison with Prior Nuclear Study: No images to compare  Overall Impression:  Low risk stress nuclear study. Small inferobasal wall infarct with no ischemia  LV Ejection Fraction: Study not gated.  LV Wall Motion:  Not gated due to PVC;s   Charlton Haws

## 2012-03-05 ENCOUNTER — Other Ambulatory Visit (HOSPITAL_COMMUNITY): Payer: Self-pay | Admitting: Chiropractic Medicine

## 2012-03-05 ENCOUNTER — Ambulatory Visit (HOSPITAL_COMMUNITY)
Admission: RE | Admit: 2012-03-05 | Discharge: 2012-03-05 | Disposition: A | Discharge status: Home / Self Care | Payer: Medicare Other | Source: Ambulatory Visit | Attending: Chiropractic Medicine | Admitting: Chiropractic Medicine

## 2012-03-05 DIAGNOSIS — R52 Pain, unspecified: Secondary | ICD-10-CM | POA: Insufficient documentation

## 2012-03-05 DIAGNOSIS — M545 Low back pain, unspecified: Secondary | ICD-10-CM | POA: Insufficient documentation

## 2012-03-05 DIAGNOSIS — M47817 Spondylosis without myelopathy or radiculopathy, lumbosacral region: Secondary | ICD-10-CM | POA: Insufficient documentation

## 2012-08-14 ENCOUNTER — Other Ambulatory Visit: Payer: Self-pay | Admitting: *Deleted

## 2012-08-14 MED ORDER — PRAVASTATIN SODIUM 40 MG PO TABS: 40.0000 mg | ORAL_TABLET | Freq: Every day | ORAL | Status: DC

## 2012-08-14 NOTE — Telephone Encounter (Signed)
Pt needs appointment then refill can be made Fax Received. Refill Completed. Teyton Pattillo Chowoe (R.M.A)   

## 2012-09-02 ENCOUNTER — Telehealth: Payer: Self-pay | Admitting: Cardiovascular Disease

## 2012-09-02 NOTE — Telephone Encounter (Signed)
Pt was made an office visit, refill was completed prior. Pt agreeable to plan.

## 2012-09-02 NOTE — Telephone Encounter (Signed)
Pt calling re pravastatin

## 2012-10-08 ENCOUNTER — Encounter: Payer: Self-pay | Admitting: Cardiovascular Disease

## 2012-10-08 ENCOUNTER — Ambulatory Visit (INDEPENDENT_AMBULATORY_CARE_PROVIDER_SITE_OTHER): Payer: Medicare Other | Admitting: Cardiovascular Disease

## 2012-10-08 VITALS — BP 128/76 | HR 65 | Ht 68.0 in | Wt 157.0 lb

## 2012-10-08 DIAGNOSIS — E785 Hyperlipidemia, unspecified: Secondary | ICD-10-CM

## 2012-10-08 DIAGNOSIS — I10 Essential (primary) hypertension: Secondary | ICD-10-CM

## 2012-10-08 DIAGNOSIS — I2510 Atherosclerotic heart disease of native coronary artery without angina pectoris: Secondary | ICD-10-CM

## 2012-10-08 NOTE — Assessment & Plan Note (Signed)
His blood pressure seems to be well-controlled.

## 2012-10-08 NOTE — Assessment & Plan Note (Signed)
Will check lipids at his next visit.   

## 2012-10-08 NOTE — Progress Notes (Signed)
William Roach Date of Birth  09-24-1921 [facility] [address]    [address] [place], [place]  [address] [phone]  Fax  [phone]  Problems:  1. Coronary artery disease-status post CABG 2. Hypertension  3. Hypercholesterolemia 4. Fatigue  History of  Present Illness:  79 year old gentleman with history history of coronary artery disease-status post coronary artery bypass grafting. He also has a history of hypertension, hyperlipidemia.  William Roach has not had any significant problems. He notes that his blood pressure is elevated today. He typically is in the normal range.  October 08, 2012: He has been having some problems with balance. He denies any chest pain or dyspnea.   He has some orthostasis.    Current Outpatient Prescriptions on File Prior to Visit  Medication Sig Dispense Refill  . aspirin 81 MG tablet Take 81 mg by mouth daily.        . Calcium Carbonate-Vit D-Min (CALTRATE PLUS PO) Take 1 tablet by mouth daily.        . Glucosamine-Chondroitin (OSTEO BI-FLEX REGULAR STRENGTH PO) Take 1 tablet by mouth 2 (two) times daily.        Marland Kitchen ibuprofen (ADVIL) 200 MG tablet Take 200 mg by mouth as needed.        Marland Kitchen levothyroxine (SYNTHROID, LEVOTHROID) 50 MCG tablet Take 50 mcg by mouth daily.       . multivitamin (THERAGRAN) per tablet Take 1 tablet by mouth daily.        . pravastatin (PRAVACHOL) 40 MG tablet Take 1 tablet (40 mg total) by mouth daily.  90 tablet  1  . vitamin C (ASCORBIC ACID) 500 MG tablet Take 1,000 mg by mouth daily.          No Known Allergies  Past Medical History  Diagnosis Date  . Coronary artery disease     post CABG x4   . Hypertension   . Hypercholesterolemia   . Fatigue   . Dizziness   . Basal cell carcinoma, ear     post -- partial pinnectomy left side with primary closure      Past Surgical History  Procedure Date  . Coronary artery bypass graft 2003    x4 --   . Cholecystectomy 2002  . Inguinal hernia repair 09/19/2007    . Ear cyst excision 12/14/2009    Excision of left auricular cyst with primary closure  . Basal cell carcinoma excision 01/14/2010    Wedge resection, partial pinnectomy left side with primary closure      . Tonsillectomy   . Carpal tunnel release     History  Smoking status  . Former Smoker  . Quit date: 06/21/1981  Smokeless tobacco  . Not on file    History  Alcohol Use No    Family History  Problem Relation Age of Onset  . Parkinsonism Father   . Angina Mother   . Coronary artery disease Brother     with CABG    Reviw of Systems:  Reviewed in the HPI.  All other systems are negative.  Physical Exam: BP 128/76  Pulse 65  Ht 5\' 8"  (1.727 m)  Wt 157 lb (71.215 kg)  BMI 23.87 kg/m2 The patient is alert and oriented x 3.  The mood and affect are normal.   Skin: warm and dry.  Color is normal.    HEENT:   the sclera are nonicteric.  The mucous membranes are moist.  The carotids are 2+ without bruits.  There is no thyromegaly.  There is no JVD.    Lungs: clear.  The chest wall is non tender.    Heart: regular rate with a normal S1 and S2.  There are no murmurs, gallops, or rubs. The PMI is not displaced.     Abdomen: good bowel sounds.  There is no guarding or rebound.  There is no hepatosplenomegaly or tenderness.  There are no masses.   Extremities:  no clubbing, cyanosis, or edema.  The legs are without rashes.  The distal pulses are intact.   Neuro:  Cranial nerves II - XII are intact.  Motor and sensory functions are intact.    The gait is normal.  ECG: January 7 ,2014: Normal sinus rhythm at 65 beats a minute. He has T-wave inversions in the inferior and lateral leads. This is not significantly changed from his previous tracing.  Assessment / Plan:

## 2012-10-08 NOTE — Patient Instructions (Addendum)
Try Antivert ( Meclizine 25 mg 3 times as needed) for vertigo symptoms.   Your physician wants you to follow-up in: 6 months  You will receive a reminder letter in the mail two months in advance. If you don't receive a letter, please call our office to schedule the follow-up appointment.   Your physician recommends that you continue on your current medications as directed. Please refer to the Current Medication list given to you today.  Your physician recommends that you return for a FASTING lipid profile: 6 months

## 2012-10-08 NOTE — Assessment & Plan Note (Signed)
Mr. William Roach is doing well. Having episodes of chest pain or shortness of breath. We'll continue with his same medications.

## 2012-11-26 ENCOUNTER — Telehealth: Payer: Self-pay | Admitting: Cardiovascular Disease

## 2012-11-26 NOTE — Telephone Encounter (Signed)
Walk in pt Form " Pt Dropped Off paperwork for RX" sent  To [facility]  11/26/12/KM

## 2012-12-27 ENCOUNTER — Telehealth: Payer: Self-pay | Admitting: Cardiovascular Disease

## 2012-12-27 MED ORDER — PRAVASTATIN SODIUM 40 MG PO TABS: 40.0000 mg | ORAL_TABLET | Freq: Every day | ORAL | Status: DC

## 2012-12-27 NOTE — Telephone Encounter (Signed)
Calling to refill Pravastatin 40mg  to [facility], 3 RF  Micki Riley, CMA

## 2013-04-09 ENCOUNTER — Ambulatory Visit (INDEPENDENT_AMBULATORY_CARE_PROVIDER_SITE_OTHER): Payer: Medicare Other | Admitting: Cardiovascular Disease

## 2013-04-09 ENCOUNTER — Encounter: Payer: Self-pay | Admitting: Cardiovascular Disease

## 2013-04-09 VITALS — BP 151/89 | HR 74 | Ht 68.0 in | Wt 154.8 lb

## 2013-04-09 DIAGNOSIS — E785 Hyperlipidemia, unspecified: Secondary | ICD-10-CM

## 2013-04-09 DIAGNOSIS — I2510 Atherosclerotic heart disease of native coronary artery without angina pectoris: Secondary | ICD-10-CM

## 2013-04-09 DIAGNOSIS — I10 Essential (primary) hypertension: Secondary | ICD-10-CM

## 2013-04-09 LAB — HEPATIC FUNCTION PANEL
ALT: 137 U/L — ABNORMAL HIGH (ref 0–53)
AST: 83 U/L — ABNORMAL HIGH (ref 0–37)
Bilirubin, Direct: 0.1 mg/dL (ref 0.0–0.3)
Total Bilirubin: 1 mg/dL (ref 0.3–1.2)

## 2013-04-09 LAB — BASIC METABOLIC PANEL
BUN: 25 mg/dL — ABNORMAL HIGH (ref 6–23)
Calcium: 9.4 mg/dL (ref 8.4–10.5)
GFR: 77.28 mL/min (ref >60.00)
Glucose, Bld: 123 mg/dL — ABNORMAL HIGH (ref 70–99)

## 2013-04-09 LAB — LIPID PANEL: VLDL: 28.8 mg/dL (ref 0.0–40.0)

## 2013-04-09 NOTE — Patient Instructions (Addendum)
Your physician recommends that you return for lab work in: TODAY, BMET LIPID, LIVER  Your physician wants you to follow-up in: 6 MONTHS  You will receive a reminder letter in the mail two months in advance. If you don't receive a letter, please call our office to schedule the follow-up appointment.   HAPPY BIRTHDAY SOON!! YOUR DOING GREAT

## 2013-04-09 NOTE — Progress Notes (Signed)
William Roach Date of Birth  [birth year] [facility] [address]    [address] [place], [place]  [address] [phone]  Fax  [phone]  Problems:  1. Coronary artery disease-status post CABG 2. Hypertension  3. Hypercholesterolemia 4. Fatigue  History of  Present Illness:  79 year old gentleman with history history of coronary artery disease-status post coronary artery bypass grafting. He also has a history of hypertension, hyperlipidemia.  William Roach has not had any significant problems. He notes that his blood pressure is elevated today. He typically is in the normal range.  October 08, 2012: He has been having some problems with balance. He denies any chest pain or dyspnea.   He has some orthostasis.    April 09, 2013:  William Roach is doing well from a cardiac standpoint.  He has fallen on occasion - has worsening balance problems . Also has bad arthritis in left knee and right hip.  His BP is elevated here but he has his BP checked regularly at [facility] and his readings are ok.   Current Outpatient Prescriptions on File Prior to Visit  Medication Sig Dispense Refill  . aspirin 81 MG tablet Take 81 mg by mouth daily.        . Calcium Carbonate-Vit D-Min (CALTRATE PLUS PO) Take 1 tablet by mouth daily.        . Glucosamine-Chondroitin (OSTEO BI-FLEX REGULAR STRENGTH PO) Take 1 tablet by mouth 2 (two) times daily.        Marland Kitchen ibuprofen (ADVIL) 200 MG tablet Take 200 mg by mouth as needed.        Marland Kitchen levothyroxine (SYNTHROID, LEVOTHROID) 50 MCG tablet Take 50 mcg by mouth daily.       . multivitamin (THERAGRAN) per tablet Take 1 tablet by mouth daily.        . pravastatin (PRAVACHOL) 40 MG tablet Take 1 tablet (40 mg total) by mouth daily.  90 tablet  3  . vitamin C (ASCORBIC ACID) 500 MG tablet Take 1,000 mg by mouth daily.         No current facility-administered medications on file prior to visit.    No Known Allergies  Past Medical History  Diagnosis Date  . Coronary  artery disease     post CABG x4   . Hypertension   . Hypercholesterolemia   . Fatigue   . Dizziness   . Basal cell carcinoma, ear     post -- partial pinnectomy left side with primary closure      Past Surgical History  Procedure Laterality Date  . Coronary artery bypass graft  2003    x4 --   . Cholecystectomy  2002  . Inguinal hernia repair  09/19/2007  . Ear cyst excision  12/14/2009    Excision of left auricular cyst with primary closure  . Basal cell carcinoma excision  01/14/2010    Wedge resection, partial pinnectomy left side with primary closure      . Tonsillectomy    . Carpal tunnel release      History  Smoking status  . Former Smoker  . Quit date: 06/21/1981  Smokeless tobacco  . Not on file    History  Alcohol Use No    Family History  Problem Relation Age of Onset  . Parkinsonism Father   . Angina Mother   . Coronary artery disease Brother     with CABG    Reviw of Systems:  Reviewed in the HPI.  All other  systems are negative.  Physical Exam: BP 151/89  Pulse 74  Ht 5\' 8"  (1.727 m)  Wt 154 lb 12.8 oz (70.217 kg)  BMI 23.54 kg/m2 The patient is alert and oriented x 3.  The mood and affect are normal.   Skin: warm and dry.  Color is normal.    HEENT:   the sclera are nonicteric.  The mucous membranes are moist.  The carotids are 2+ without bruits.  There is no thyromegaly.  There is no JVD.    Lungs: clear.  The chest wall is non tender.    Heart: regular rate with a normal S1 and S2.  There are no murmurs, gallops, or rubs. The PMI is not displaced.     Abdomen: good bowel sounds.  There is no guarding or rebound.  There is no hepatosplenomegaly or tenderness.  There are no masses.   Extremities:  no clubbing, cyanosis, or edema.  The legs are without rashes.  The distal pulses are intact.   Neuro:  Cranial nerves II - XII are intact.  Motor and sensory functions are intact.    The gait is normal.  ECG: January 7 ,2014: Normal  sinus rhythm at 65 beats a minute. He has T-wave inversions in the inferior and lateral leads. This is not significantly changed from his previous tracing.  Assessment / Plan:

## 2013-04-09 NOTE — Assessment & Plan Note (Signed)
Will check lipids, liver enzymes, and bmp today.  i will see him again in 6 months for OV and repeat blood work.

## 2013-04-09 NOTE — Assessment & Plan Note (Signed)
His BP is a bit elevated today but he has it checked regularly at  [facility]

## 2013-04-10 ENCOUNTER — Telehealth: Payer: Self-pay | Admitting: Cardiovascular Disease

## 2013-04-10 NOTE — Telephone Encounter (Signed)
Pt was called and understands to hold med and will call pcp Monday for an app.

## 2013-04-10 NOTE — Telephone Encounter (Signed)
Follow up  Pt is returning your call, he said he did not understand the message.

## 2013-04-10 NOTE — Telephone Encounter (Signed)
Message copied by Antony Odea on Thu Apr 10, 2013  5:28 PM ------      Message from: Vesta Mixer      Created: Thu Apr 10, 2013  2:13 PM       His LFT s are elevated.  He has been on the same dose of Pravachol ( I think).  He should hold his pravachol for 1 month.  He needs to be referred to his medical doctor. - I think these liver elevations are due to something other than his pravachol. ------

## 2013-04-11 NOTE — Telephone Encounter (Signed)
**Note De-identified April Carlyon Obfuscation** Pt advised, he verbalized understanding. 

## 2013-04-11 NOTE — Telephone Encounter (Signed)
New Prob      Pt needs some clarification on his medications. Please call.

## 2013-04-17 ENCOUNTER — Other Ambulatory Visit: Payer: Self-pay | Admitting: *Deleted

## 2013-04-28 ENCOUNTER — Telehealth: Payer: Self-pay | Admitting: Cardiovascular Disease

## 2013-04-28 DIAGNOSIS — R748 Abnormal levels of other serum enzymes: Secondary | ICD-10-CM

## 2013-04-28 NOTE — Telephone Encounter (Signed)
Liver enzymes were elevated last lab draw. Re evaluate next month, date given, pt verbalized understanding.

## 2013-04-28 NOTE — Telephone Encounter (Signed)
Follow Up     Pt following up on test results. Please call.

## 2013-05-14 ENCOUNTER — Other Ambulatory Visit (INDEPENDENT_AMBULATORY_CARE_PROVIDER_SITE_OTHER): Payer: Medicare Other

## 2013-05-14 DIAGNOSIS — R748 Abnormal levels of other serum enzymes: Secondary | ICD-10-CM

## 2013-05-14 LAB — HEPATIC FUNCTION PANEL
Alkaline Phosphatase: 71 U/L (ref 39–117)
Bilirubin, Direct: 0.2 mg/dL (ref 0.0–0.3)
Total Bilirubin: 1.1 mg/dL (ref 0.3–1.2)
Total Protein: 6.3 g/dL (ref 6.0–8.3)

## 2013-05-16 ENCOUNTER — Telehealth: Payer: Self-pay | Admitting: Cardiovascular Disease

## 2013-05-16 NOTE — Telephone Encounter (Signed)
Reviewed labs/ no statins to be taken, will have repeat lab in 4 months

## 2013-05-16 NOTE — Telephone Encounter (Signed)
Follow up  Pt calling regarding his lab work.

## 2013-05-29 ENCOUNTER — Ambulatory Visit: Payer: Medicare Other

## 2013-05-29 ENCOUNTER — Ambulatory Visit (INDEPENDENT_AMBULATORY_CARE_PROVIDER_SITE_OTHER): Payer: Medicare Other | Admitting: Family Medicine

## 2013-05-29 VITALS — BP 120/82 | HR 70 | Temp 98.0°F | Resp 16

## 2013-05-29 DIAGNOSIS — M25569 Pain in unspecified knee: Secondary | ICD-10-CM

## 2013-05-29 DIAGNOSIS — M25562 Pain in left knee: Secondary | ICD-10-CM

## 2013-05-29 DIAGNOSIS — M1710 Unilateral primary osteoarthritis, unspecified knee: Secondary | ICD-10-CM

## 2013-05-29 DIAGNOSIS — M1712 Unilateral primary osteoarthritis, left knee: Secondary | ICD-10-CM

## 2013-05-29 NOTE — Patient Instructions (Signed)
Wear brace, ice on area for up to 20 minutes at a time. Walker if able to put some weight on knee, or wheelchair if needed. We are referring you to [facility] in next few days. Return to the clinic or go to the nearest emergency room if any of your symptoms worsen or new symptoms occur.

## 2013-05-29 NOTE — Progress Notes (Signed)
Subjective:    Patient ID: [name], male    DOB: May 09, 1921, 79 y.o.   MRN: [id number]  HPI William Roach is a 79 y.o. male  Woke up this morning, turning at home, felt pop in outside of L knee.  Unable to walk, difficulty with WB- short steps.  No hip/ankle/foot pain. Hx of L Knee arthritis - appt Sept 9th - for L knee with Dr. Darrelyn Hillock -   Has walker at home that uses at times, but some difficulty with this today.   Tx: heating pad. No other tx. Has pain medicine at home if needed.     Past Medical History  Diagnosis Date  . Coronary artery disease     post CABG x4   . Hypertension   . Hypercholesterolemia   . Fatigue   . Dizziness   . Basal cell carcinoma, ear     post -- partial pinnectomy left side with primary closure     Past Surgical History  Procedure Laterality Date  . Coronary artery bypass graft  2003    x4 --   . Cholecystectomy  2002  . Inguinal hernia repair  09/19/2007  . Ear cyst excision  12/14/2009    Excision of left auricular cyst with primary closure  . Basal cell carcinoma excision  01/14/2010    Wedge resection, partial pinnectomy left side with primary closure      . Tonsillectomy    . Carpal tunnel release     No Known Allergies Prior to Admission medications   Medication Sig Start Date End Date Taking? Authorizing Provider  aspirin 81 MG tablet Take 81 mg by mouth daily.     Yes Historical Provider, MD  Calcium Carbonate-Vit D-Min (CALTRATE PLUS PO) Take 1 tablet by mouth daily.     Yes Historical Provider, MD  diclofenac (VOLTAREN) 75 MG EC tablet Take 75 mg by mouth 2 (two) times daily.   Yes Historical Provider, MD  Glucosamine-Chondroitin (OSTEO BI-FLEX REGULAR STRENGTH PO) Take 1 tablet by mouth 2 (two) times daily.     Yes Historical Provider, MD  ibuprofen (ADVIL) 200 MG tablet Take 200 mg by mouth as needed.     Yes Historical Provider, MD  levothyroxine (SYNTHROID, LEVOTHROID) 50 MCG tablet Take 50 mcg by mouth daily.   12/01/11  Yes Historical Provider, MD  multivitamin [facility]) per tablet Take 1 tablet by mouth daily.     Yes Historical Provider, MD  traMADol (ULTRAM) 50 MG tablet Take 50 mg by mouth every 6 (six) hours as needed for pain.   Yes Historical Provider, MD  vitamin C (ASCORBIC ACID) 500 MG tablet Take 1,000 mg by mouth daily.     Yes Historical Provider, MD   History   Social History  . Marital Status: Widowed    Spouse Name: N/A    Number of Children: N/A  . Years of Education: N/A   Occupational History  . Not on file.   Social History Main Topics  . Smoking status: Former Smoker    Quit date: 06/21/1981  . Smokeless tobacco: Not on file  . Alcohol Use: No  . Drug Use: No  . Sexual Activity:    Other Topics Concern  . Not on file   Social History Narrative  . No narrative on file   SH: [facility] Seen by Nurse today.  Review of Systems  Musculoskeletal: Positive for arthralgias (L knee. not hip/ankle/foot. ).  Skin: Negative for  rash and wound.       Objective:   Physical Exam  Vitals reviewed. Constitutional: He is oriented to person, place, and time. He appears well-developed and well-nourished.  Pulmonary/Chest: Effort normal.  Musculoskeletal:       Left hip: He exhibits no bony tenderness. Decreased range of motion: guarded/ difficulty with terminal rom from pain in knee, but no hip pain with rom, no hip ttp.        Left knee: He exhibits decreased range of motion (flex 90, ext lacks about 10 degrees. ), swelling (lateral knee. ), LCL laxity (guarded exam. ) and bony tenderness (latearl jt line but to fibular head. ). He exhibits no ecchymosis, no erythema and normal patellar mobility. Tenderness found. Lateral joint line and LCL (ttp over LCL, lateral joint line. skin intact. pain with varus testing. ) tenderness noted. No medial joint line, no MCL and no patellar tendon tenderness noted.  Neurological: He is alert and oriented to person, place, and  time.  nvi distally.   Skin: Skin is warm and dry. No rash noted.  Psychiatric: He has a normal mood and affect.    [facility] reading (PRIMARY) by  Dr. Neva Seat: L knee: marked medial greater than lateral DJD, vascular calcifications noted, no apparent fracture.      Assessment & Plan:  [name] is a 79 y.o. male Left knee pain - Plan: DG Knee Complete 4 Views Left, Ambulatory referral to Orthopedic Surgery  Osteoarthritis of left knee - Plan: Ambulatory referral to Orthopedic Surgery    Underlying DJD, MOI and location of pain - possible degenerative meniscal tear vs LCL tear. Hinged brace applied, has walker at home - discussed wbat ok with wlaker or wheelchair until seen by ortho - will refer to be seen in next 4-5 days - ideally tomorrow if still difficulty with WB. Ice to area, and has pain med at home if needed. Discussed having nurse at [facility] call me if needed tomorrow.  Rtc/er precautions.    Diagnosis, plan of care discussed, verified understanding with repeat back of plan and AVS printed.   Patient Instructions  Wear brace, ice on area for up to 20 minutes at a time. Walker if able to put some weight on knee, or wheelchair if needed. We are referring you to [facility] in next few days. Return to the clinic or go to the nearest emergency room if any of your symptoms worsen or new symptoms occur.

## 2013-06-03 ENCOUNTER — Telehealth: Payer: Self-pay

## 2013-06-03 NOTE — Telephone Encounter (Signed)
Pt called states he was seen on 05/29/13 by dr Neva Seat. Pt states he is not feeling better, the knee is hurting badly and he can't get around. Pt is scared to go outside because of fear of falling. Pt would like to talk with dr Neva Seat about what to do

## 2013-06-04 NOTE — Telephone Encounter (Signed)
Noted.  I had initially intended him to be seen 05/30/13, but looks like he was not able to be scheduled until the 9th with DR. Gioffre.  Is he using the walker? Can we try to get him in next 2 days with someone else at [facility]? If worsening - I can arrange an MRI, but as already established at [facility], was trying to have him seen there for next step. Thanks.

## 2013-06-05 NOTE — Telephone Encounter (Signed)
Called him his appt with Dr Darrelyn Hillock is next Tuesday. They can work him in at 3:45 today.

## 2013-06-05 NOTE — Telephone Encounter (Signed)
Called patient to advise of appt time with Dr Darrelyn Hillock, he will go today. To you FYI

## 2013-06-20 ENCOUNTER — Encounter (HOSPITAL_COMMUNITY): Payer: Self-pay | Admitting: Pharmacy Technician

## 2013-06-24 ENCOUNTER — Encounter (HOSPITAL_COMMUNITY): Payer: Self-pay

## 2013-06-24 ENCOUNTER — Ambulatory Visit (HOSPITAL_COMMUNITY)
Admission: RE | Admit: 2013-06-24 | Discharge: 2013-06-24 | Disposition: A | Discharge status: Home / Self Care | Payer: Medicare Other | Source: Ambulatory Visit | Attending: Surgical | Admitting: Surgical

## 2013-06-24 ENCOUNTER — Encounter (HOSPITAL_COMMUNITY)
Admission: RE | Admit: 2013-06-24 | Discharge: 2013-06-24 | Disposition: A | Discharge status: Home / Self Care | Payer: Medicare Other | Source: Ambulatory Visit | Attending: Orthopedic Surgery | Admitting: Orthopedic Surgery

## 2013-06-24 DIAGNOSIS — Z01812 Encounter for preprocedural laboratory examination: Secondary | ICD-10-CM | POA: Insufficient documentation

## 2013-06-24 DIAGNOSIS — M1710 Unilateral primary osteoarthritis, unspecified knee: Secondary | ICD-10-CM | POA: Insufficient documentation

## 2013-06-24 DIAGNOSIS — Z01818 Encounter for other preprocedural examination: Secondary | ICD-10-CM | POA: Insufficient documentation

## 2013-06-24 DIAGNOSIS — Z951 Presence of aortocoronary bypass graft: Secondary | ICD-10-CM | POA: Insufficient documentation

## 2013-06-24 DIAGNOSIS — I10 Essential (primary) hypertension: Secondary | ICD-10-CM | POA: Insufficient documentation

## 2013-06-24 HISTORY — PX: OTHER SURGICAL HISTORY: SHX169

## 2013-06-24 HISTORY — DX: Personal history of urinary calculi: Z87.442

## 2013-06-24 LAB — COMPREHENSIVE METABOLIC PANEL WITH GFR
ALT: 22 U/L (ref 0–53)
AST: 30 U/L (ref 0–37)
Albumin: 3.9 g/dL (ref 3.5–5.2)
Alkaline Phosphatase: 87 U/L (ref 39–117)
BUN: 31 mg/dL — ABNORMAL HIGH (ref 6–23)
CO2: 26 meq/L (ref 19–32)
Calcium: 9.7 mg/dL (ref 8.4–10.5)
Chloride: 101 meq/L (ref 96–112)
Creatinine, Ser: 0.98 mg/dL (ref 0.50–1.35)
GFR calc Af Amer: 81 mL/min — ABNORMAL LOW
GFR calc non Af Amer: 70 mL/min — ABNORMAL LOW
Glucose, Bld: 97 mg/dL (ref 70–99)
Potassium: 4.9 meq/L (ref 3.5–5.1)
Sodium: 136 meq/L (ref 135–145)
Total Bilirubin: 0.6 mg/dL (ref 0.3–1.2)
Total Protein: 6.7 g/dL (ref 6.0–8.3)

## 2013-06-24 LAB — CBC
HCT: 42.2 % (ref 39.0–52.0)
Hemoglobin: 14.3 g/dL (ref 13.0–17.0)
MCH: 31.8 pg (ref 26.0–34.0)
MCHC: 33.9 g/dL (ref 30.0–36.0)
MCV: 93.8 fL (ref 78.0–100.0)
Platelets: 177 K/uL (ref 150–400)
RBC: 4.5 MIL/uL (ref 4.22–5.81)
RDW: 13.4 % (ref 11.5–15.5)
WBC: 7.4 K/uL (ref 4.0–10.5)

## 2013-06-24 LAB — ABO/RH: ABO/RH(D): O POS

## 2013-06-24 LAB — SURGICAL PCR SCREEN
MRSA, PCR: NEGATIVE
Staphylococcus aureus: NEGATIVE

## 2013-06-24 LAB — URINALYSIS, ROUTINE W REFLEX MICROSCOPIC
Bilirubin Urine: NEGATIVE
Glucose, UA: NEGATIVE mg/dL
Hgb urine dipstick: NEGATIVE
Ketones, ur: NEGATIVE mg/dL
Leukocytes, UA: NEGATIVE
Nitrite: NEGATIVE
Protein, ur: NEGATIVE mg/dL
Specific Gravity, Urine: 1.02 (ref 1.005–1.030)
Urobilinogen, UA: 0.2 mg/dL (ref 0.0–1.0)
pH: 6.5 (ref 5.0–8.0)

## 2013-06-24 LAB — APTT: aPTT: 27 s (ref 24–37)

## 2013-06-24 LAB — PROTIME-INR
INR: 0.96 (ref 0.00–1.49)
Prothrombin Time: 12.6 s (ref 11.6–15.2)

## 2013-06-24 NOTE — Patient Instructions (Addendum)
20 ZAC TORTI  06/24/2013   Your procedure is scheduled on:   06-27-2013  Report to [facility] at    1000    AM .  Call this number if you have problems the morning of surgery: [phone]  Or Presurgical Testing [phone](Leshay Desaulniers)   Do not eat food:After Midnight.  Clear liquids:up to 6 Hours before arrival. Nothing after : 0700 AM  Clear liquids include soda, tea, black coffee, apple or grape juice, broth.  Take these medicines the morning of surgery with A SIP OF WATER: Levothyroxine.   Do not wear jewelry, make-up or nail polish.  Do not wear lotions, powders, or perfumes. You may wear deodorant.  Do not shave 12 hours prior to first CHG shower(legs and under arms).(face and neck okay.)  Do not bring valuables to the hospital.  Contacts, dentures or bridgework,body piercing,  may not be worn into surgery.  Leave suitcase in the car. After surgery it may be brought to your room.  For patients admitted to the hospital, checkout time is 11:00 AM the day of discharge.   Patients discharged the day of surgery will not be allowed to drive home. Must have responsible person with you x 24 hours once discharged.  Name and phone number of your driver: son or [facility] to provide.  Special Instructions: CHG(Chlorhedine 4%-"Hibiclens","Betasept","Aplicare") Shower Use Special Wash: see special instructions.(avoid face and genitals)   Please read over the following fact sheets that you were given: MRSA Information, Blood Transfusion fact sheet, Incentive Spirometry Instruction.    Failure to follow these instructions may result in Cancellation of your surgery.   Patient signature_______________________________________________________

## 2013-06-24 NOTE — H&P (Signed)
TOTAL KNEE ADMISSION H&P  Patient is being admitted for left total knee arthroplasty.  Subjective:  Chief Complaint:left knee pain.  HPI: William Roach, 79 y.o. male, has a history of pain and functional disability in the left knee due to arthritis and has failed non-surgical conservative treatments for greater than 12 weeks to includeNSAID's and/or analgesics, corticosteriod injections, use of assistive devices and activity modification.  Onset of symptoms was gradual, starting >10 years ago with gradually worsening course since that time. The patient noted no past surgery on the left knee(s).  Patient currently rates pain in the left knee(s) at 7 out of 10 with activity. Patient has night pain, worsening of pain with activity and weight bearing, pain that interferes with activities of daily living, pain with passive range of motion, crepitus and joint swelling.  Patient has evidence of periarticular osteophytes and joint space narrowing by imaging studies. There is no active infection.  Patient Active Problem List   Diagnosis Date Noted  . CAD (coronary artery disease) 07/04/2011  . HTN (hypertension) 07/04/2011  . Hyperlipidemia 07/04/2011   Past Medical History  Diagnosis Date  . Coronary artery disease     post CABG x4   . Hypertension   . Hypercholesterolemia   . Fatigue   . Dizziness   . Basal cell carcinoma, ear     post -- partial pinnectomy left side with primary closure      Past Surgical History  Procedure Laterality Date  . Coronary artery bypass graft  2003    x4 --   . Cholecystectomy  2002  . Inguinal hernia repair  09/19/2007  . Ear cyst excision  12/14/2009    Excision of left auricular cyst with primary closure  . Basal cell carcinoma excision  01/14/2010    Wedge resection, partial pinnectomy left side with primary closure      . Tonsillectomy    . Carpal tunnel release       Current outpatient prescriptions: aspirin 81 MG tablet, Take 81 mg by mouth  daily.  , Disp: , Rfl: ;   Calcium Carbonate-Vit D-Min (CALTRATE PLUS PO), Take 1 tablet by mouth daily. , Disp: , Rfl: ;   diclofenac (VOLTAREN) 75 MG EC tablet, Take 75 mg by mouth 2 (two) times daily., Disp: , Rfl: ;   levothyroxine (SYNTHROID, LEVOTHROID) 50 MCG tablet, Take 50 mcg by mouth daily before breakfast. , Disp: , Rfl:  Multiple Vitamin (MULTIVITAMIN WITH MINERALS) TABS tablet, Take 1 tablet by mouth daily., Disp: , Rfl: ;   vitamin C (ASCORBIC ACID) 500 MG tablet, Take 500 mg by mouth daily. , Disp: , Rfl:   No Known Allergies  History  Substance Use Topics  . Smoking status: Former Smoker    Quit date: 06/21/1981  . Smokeless tobacco: Not on file  . Alcohol Use: No    Family History  Problem Relation Age of Onset  . Parkinsonism Father   . Angina Mother   . Coronary artery disease Brother     with CABG     Review of Systems  Constitutional: Negative.   HENT: Positive for hearing loss. Negative for ear pain, nosebleeds, congestion, sore throat, neck pain, tinnitus and ear discharge.   Eyes: Negative.   Respiratory: Positive for shortness of breath. Negative for cough, hemoptysis, sputum production, wheezing and stridor.        SOB with exertion  Cardiovascular: Negative.   Gastrointestinal: Negative.   Genitourinary: Positive for frequency. Negative for  dysuria, urgency, hematuria and flank pain.  Musculoskeletal: Positive for back pain and joint pain. Negative for myalgias and falls.  Skin: Negative.   Neurological: Negative.  Negative for headaches.  Endo/Heme/Allergies: Negative.   Psychiatric/Behavioral: Negative.     Objective:  Physical Exam  Constitutional: He is oriented to person, place, and time. He appears well-developed and well-nourished. No distress.  HENT:  Head: Normocephalic and atraumatic.  Right Ear: External ear normal.  Left Ear: External ear normal.  Nose: Nose normal.  Mouth/Throat: Oropharynx is clear and moist.  Eyes:  Conjunctivae and EOM are normal.  Neck: Normal range of motion. Neck supple.  Cardiovascular: Normal rate, normal heart sounds and intact distal pulses.   No murmur heard. Respiratory: Effort normal and breath sounds normal. No respiratory distress. He has no wheezes.  GI: Soft. Bowel sounds are normal. He exhibits no distension. There is no tenderness.  Musculoskeletal:       Right hip: Normal.       Left hip: Normal.       Right knee: He exhibits decreased range of motion. He exhibits no swelling and no effusion. Tenderness found. Medial joint line tenderness noted. No lateral joint line tenderness noted.       Left knee: He exhibits decreased range of motion and swelling. He exhibits no effusion and no erythema. Tenderness found. Medial joint line and lateral joint line tenderness noted.       Right lower leg: He exhibits no tenderness and no swelling.       Left lower leg: He exhibits no tenderness and no swelling.  10-100 degrees motion in left knee  Neurological: He is alert and oriented to person, place, and time. He has normal strength and normal reflexes. No sensory deficit.  Skin: No rash noted. He is not diaphoretic. No erythema.  Psychiatric: He has a normal mood and affect. His behavior is normal.  Vitals Weight: 152 lb Height: 68 in Body Surface Area: 1.82 m Body Mass Index: 23.11 kg/m Pulse: 84 (Regular) BP: 136/86 (Sitting, Left Arm, Standard)    Imaging Review Plain radiographs demonstrate severe degenerative joint disease of the left knee(s). The overall alignment issignificant varus. The bone quality appears to be fair for age and reported activity level.  Assessment/Plan:  End stage arthritis, left knee   The patient history, physical examination, clinical judgment of the provider and imaging studies are consistent with end stage degenerative joint disease of the left knee(s) and total knee arthroplasty is deemed medically necessary. The treatment  options including medical management, injection therapy arthroscopy and arthroplasty were discussed at length. The risks and benefits of total knee arthroplasty were presented and reviewed. The risks due to aseptic loosening, infection, stiffness, patella tracking problems, thromboembolic complications and other imponderables were discussed. The patient acknowledged the explanation, agreed to proceed with the plan and consent was signed. Patient is being admitted for inpatient treatment for surgery, pain control, PT, OT, prophylactic antibiotics, VTE prophylaxis, progressive ambulation and ADL's and discharge planning. The patient is planning to be discharged to skilled nursing facility (back to [facility])   Dimitri Ped, [place]

## 2013-06-24 NOTE — Pre-Procedure Instructions (Addendum)
06-24-13 1415 EKG 1'14 , Stress 4'14 -Epic. 06-25-13 Labs viewable in Epic-note faxed to Dr. Jeannetta Ellis office.W. Kennon Portela

## 2013-06-25 NOTE — Progress Notes (Signed)
06-25-13 Labs viewable in Epic.W. Kennon Portela

## 2013-06-27 ENCOUNTER — Inpatient Hospital Stay (HOSPITAL_COMMUNITY)
Admission: RE | Admit: 2013-06-27 | Discharge: 2013-07-01 | DRG: 470 | Disposition: A | Discharge status: Skilled Nursing Facility | Payer: Medicare Other | Source: Ambulatory Visit | Attending: Orthopedic Surgery | Admitting: Orthopedic Surgery

## 2013-06-27 ENCOUNTER — Encounter (HOSPITAL_COMMUNITY): Payer: Self-pay

## 2013-06-27 ENCOUNTER — Inpatient Hospital Stay (HOSPITAL_COMMUNITY): Payer: Medicare Other | Admitting: Anesthesiology

## 2013-06-27 ENCOUNTER — Inpatient Hospital Stay (HOSPITAL_COMMUNITY): Payer: Medicare Other

## 2013-06-27 ENCOUNTER — Encounter (HOSPITAL_COMMUNITY)
Admission: RE | Disposition: A | Discharge status: Skilled Nursing Facility | Payer: Self-pay | Source: Ambulatory Visit | Attending: Orthopedic Surgery

## 2013-06-27 ENCOUNTER — Encounter (HOSPITAL_COMMUNITY): Payer: Self-pay | Admitting: Anesthesiology

## 2013-06-27 DIAGNOSIS — I10 Essential (primary) hypertension: Secondary | ICD-10-CM | POA: Diagnosis present

## 2013-06-27 DIAGNOSIS — D62 Acute posthemorrhagic anemia: Secondary | ICD-10-CM | POA: Diagnosis not present

## 2013-06-27 DIAGNOSIS — E785 Hyperlipidemia, unspecified: Secondary | ICD-10-CM | POA: Diagnosis present

## 2013-06-27 DIAGNOSIS — Y842 Radiological procedure and radiotherapy as the cause of abnormal reaction of the patient, or of later complication, without mention of misadventure at the time of the procedure: Secondary | ICD-10-CM | POA: Diagnosis present

## 2013-06-27 DIAGNOSIS — M1712 Unilateral primary osteoarthritis, left knee: Secondary | ICD-10-CM

## 2013-06-27 DIAGNOSIS — Z96652 Presence of left artificial knee joint: Secondary | ICD-10-CM

## 2013-06-27 DIAGNOSIS — D500 Iron deficiency anemia secondary to blood loss (chronic): Secondary | ICD-10-CM | POA: Diagnosis not present

## 2013-06-27 DIAGNOSIS — R339 Retention of urine, unspecified: Secondary | ICD-10-CM | POA: Diagnosis not present

## 2013-06-27 DIAGNOSIS — E7800 Pure hypercholesterolemia, unspecified: Secondary | ICD-10-CM | POA: Diagnosis present

## 2013-06-27 DIAGNOSIS — N9989 Other postprocedural complications and disorders of genitourinary system: Secondary | ICD-10-CM | POA: Diagnosis not present

## 2013-06-27 DIAGNOSIS — E039 Hypothyroidism, unspecified: Secondary | ICD-10-CM | POA: Diagnosis present

## 2013-06-27 DIAGNOSIS — IMO0002 Reserved for concepts with insufficient information to code with codable children: Secondary | ICD-10-CM | POA: Diagnosis not present

## 2013-06-27 DIAGNOSIS — Z7982 Long term (current) use of aspirin: Secondary | ICD-10-CM

## 2013-06-27 DIAGNOSIS — Z87891 Personal history of nicotine dependence: Secondary | ICD-10-CM

## 2013-06-27 DIAGNOSIS — N320 Bladder-neck obstruction: Secondary | ICD-10-CM | POA: Diagnosis present

## 2013-06-27 DIAGNOSIS — M1710 Unilateral primary osteoarthritis, unspecified knee: Principal | ICD-10-CM | POA: Diagnosis present

## 2013-06-27 DIAGNOSIS — M24569 Contracture, unspecified knee: Secondary | ICD-10-CM | POA: Diagnosis present

## 2013-06-27 DIAGNOSIS — I2510 Atherosclerotic heart disease of native coronary artery without angina pectoris: Secondary | ICD-10-CM | POA: Diagnosis present

## 2013-06-27 DIAGNOSIS — Z951 Presence of aortocoronary bypass graft: Secondary | ICD-10-CM

## 2013-06-27 DIAGNOSIS — Z79899 Other long term (current) drug therapy: Secondary | ICD-10-CM

## 2013-06-27 HISTORY — PX: TOTAL KNEE ARTHROPLASTY: SHX125

## 2013-06-27 HISTORY — DX: Unilateral primary osteoarthritis, left knee: M17.12

## 2013-06-27 LAB — TYPE AND SCREEN
ABO/RH(D): O POS
ABO/RH(D): O POS
Antibody Screen: NEGATIVE

## 2013-06-27 SURGERY — ARTHROPLASTY, KNEE, TOTAL
Anesthesia: General | Site: Knee | Laterality: Left | Wound class: Clean

## 2013-06-27 MED ORDER — ACETAMINOPHEN 650 MG RE SUPP: 650.0000 mg | Freq: Four times a day (QID) | RECTAL | Status: DC | PRN

## 2013-06-27 MED ORDER — BUPIVACAINE LIPOSOME 1.3 % IJ SUSP
20.0000 mL | Freq: Once | INTRAMUSCULAR | Status: DC
  Filled 2013-06-27: qty 20

## 2013-06-27 MED ORDER — THROMBIN 20000 UNITS EX KIT
PACK | CUTANEOUS | Status: DC | PRN
  Administered 2013-06-27: 15:00:00 via TOPICAL

## 2013-06-27 MED ORDER — HYDROCODONE-ACETAMINOPHEN 5-325 MG PO TABS: 1.0000 | ORAL_TABLET | ORAL | Status: DC | PRN

## 2013-06-27 MED ORDER — METHOCARBAMOL 500 MG PO TABS
500.0000 mg | ORAL_TABLET | Freq: Four times a day (QID) | ORAL | Status: DC | PRN
  Administered 2013-06-29 – 2013-07-01 (×3): 500 mg via ORAL
  Filled 2013-06-27 (×3): qty 1

## 2013-06-27 MED ORDER — THROMBIN 5000 UNITS EX SOLR
CUTANEOUS | Status: AC
  Filled 2013-06-27: qty 10000

## 2013-06-27 MED ORDER — SODIUM CHLORIDE 0.9 % IJ SOLN
INTRAMUSCULAR | Status: AC
  Filled 2013-06-27: qty 50

## 2013-06-27 MED ORDER — ACETAMINOPHEN 325 MG PO TABS
650.0000 mg | ORAL_TABLET | Freq: Four times a day (QID) | ORAL | Status: DC | PRN
  Administered 2013-06-28 – 2013-06-30 (×6): 650 mg via ORAL
  Filled 2013-06-27 (×6): qty 2

## 2013-06-27 MED ORDER — SODIUM CHLORIDE 0.9 % IR SOLN
Status: DC | PRN
  Administered 2013-06-27: 14:00:00

## 2013-06-27 MED ORDER — EPHEDRINE SULFATE 50 MG/ML IJ SOLN
INTRAMUSCULAR | Status: DC | PRN
  Administered 2013-06-27: 10 mg via INTRAVENOUS

## 2013-06-27 MED ORDER — CEFAZOLIN SODIUM-DEXTROSE 2-3 GM-% IV SOLR
2.0000 g | INTRAVENOUS | Status: AC
  Administered 2013-06-27: 2 g via INTRAVENOUS

## 2013-06-27 MED ORDER — PROPOFOL 10 MG/ML IV BOLUS
INTRAVENOUS | Status: DC | PRN
  Administered 2013-06-27: 80 mg via INTRAVENOUS

## 2013-06-27 MED ORDER — RIVAROXABAN 10 MG PO TABS
10.0000 mg | ORAL_TABLET | Freq: Every day | ORAL | Status: DC
  Administered 2013-06-28 – 2013-07-01 (×4): 10 mg via ORAL
  Filled 2013-06-27 (×5): qty 1

## 2013-06-27 MED ORDER — LACTATED RINGERS IV SOLN
INTRAVENOUS | Status: DC
  Administered 2013-06-27 (×2): via INTRAVENOUS
  Administered 2013-06-27: 1000 mL via INTRAVENOUS

## 2013-06-27 MED ORDER — SUCCINYLCHOLINE CHLORIDE 20 MG/ML IJ SOLN
INTRAMUSCULAR | Status: DC | PRN
  Administered 2013-06-27: 100 mg via INTRAVENOUS

## 2013-06-27 MED ORDER — LACTATED RINGERS IV SOLN
INTRAVENOUS | Status: DC
  Administered 2013-06-27: 18:00:00 100 mL/h via INTRAVENOUS
  Administered 2013-06-28 – 2013-06-30 (×5): via INTRAVENOUS

## 2013-06-27 MED ORDER — CHLORHEXIDINE GLUCONATE 4 % EX LIQD
60.0000 mL | Freq: Once | CUTANEOUS | Status: DC
  Filled 2013-06-27: qty 60

## 2013-06-27 MED ORDER — LEVOTHYROXINE SODIUM 50 MCG PO TABS
50.0000 ug | ORAL_TABLET | Freq: Every day | ORAL | Status: DC
  Administered 2013-06-28 – 2013-07-01 (×4): 50 ug via ORAL
  Filled 2013-06-27 (×5): qty 1

## 2013-06-27 MED ORDER — PHENOL 1.4 % MT LIQD: 1.0000 | OROMUCOSAL | Status: DC | PRN

## 2013-06-27 MED ORDER — MENTHOL 3 MG MT LOZG
1.0000 | LOZENGE | OROMUCOSAL | Status: DC | PRN
  Filled 2013-06-27: qty 9

## 2013-06-27 MED ORDER — OXYCODONE-ACETAMINOPHEN 5-325 MG PO TABS
2.0000 | ORAL_TABLET | ORAL | Status: DC | PRN
  Administered 2013-06-27: 1 via ORAL
  Filled 2013-06-27: qty 2

## 2013-06-27 MED ORDER — METHOCARBAMOL 100 MG/ML IJ SOLN
500.0000 mg | Freq: Four times a day (QID) | INTRAVENOUS | Status: DC | PRN
  Administered 2013-06-27: 500 mg via INTRAVENOUS
  Filled 2013-06-27: qty 5

## 2013-06-27 MED ORDER — LACTATED RINGERS IV SOLN: INTRAVENOUS | Status: DC

## 2013-06-27 MED ORDER — ONDANSETRON HCL 4 MG PO TABS: 4.0000 mg | ORAL_TABLET | Freq: Four times a day (QID) | ORAL | Status: DC | PRN

## 2013-06-27 MED ORDER — FENTANYL CITRATE 0.05 MG/ML IJ SOLN
INTRAMUSCULAR | Status: DC | PRN
  Administered 2013-06-27 (×4): 50 ug via INTRAVENOUS

## 2013-06-27 MED ORDER — ONDANSETRON HCL 4 MG/2ML IJ SOLN
INTRAMUSCULAR | Status: DC | PRN
  Administered 2013-06-27: 4 mg via INTRAVENOUS

## 2013-06-27 MED ORDER — CELECOXIB 200 MG PO CAPS
200.0000 mg | ORAL_CAPSULE | Freq: Two times a day (BID) | ORAL | Status: DC
  Administered 2013-06-27 – 2013-07-01 (×7): 200 mg via ORAL
  Filled 2013-06-27 (×9): qty 1

## 2013-06-27 MED ORDER — POLYETHYLENE GLYCOL 3350 17 G PO PACK
17.0000 g | PACK | Freq: Every day | ORAL | Status: DC | PRN
  Administered 2013-06-27 – 2013-06-29 (×2): 17 g via ORAL

## 2013-06-27 MED ORDER — FLEET ENEMA 7-19 GM/118ML RE ENEM: 1.0000 | ENEMA | Freq: Once | RECTAL | Status: AC | PRN

## 2013-06-27 MED ORDER — CEFAZOLIN SODIUM-DEXTROSE 2-3 GM-% IV SOLR
INTRAVENOUS | Status: AC
  Filled 2013-06-27: qty 50

## 2013-06-27 MED ORDER — BISACODYL 10 MG RE SUPP: 10.0000 mg | Freq: Every day | RECTAL | Status: DC | PRN

## 2013-06-27 MED ORDER — HYDROMORPHONE HCL PF 1 MG/ML IJ SOLN
0.2500 mg | INTRAMUSCULAR | Status: DC | PRN
  Administered 2013-06-27 (×2): 0.5 mg via INTRAVENOUS

## 2013-06-27 MED ORDER — SODIUM CHLORIDE 0.9 % IR SOLN
Status: DC | PRN
  Administered 2013-06-27: 1000 mL

## 2013-06-27 MED ORDER — THROMBIN 5000 UNITS EX SOLR
CUTANEOUS | Status: AC
  Filled 2013-06-27: qty 5000

## 2013-06-27 MED ORDER — BUPIVACAINE LIPOSOME 1.3 % IJ SUSP
INTRAMUSCULAR | Status: DC | PRN
  Administered 2013-06-27: 20 mL

## 2013-06-27 MED ORDER — HYDROMORPHONE HCL PF 1 MG/ML IJ SOLN
INTRAMUSCULAR | Status: AC
  Filled 2013-06-27: qty 1

## 2013-06-27 MED ORDER — PROMETHAZINE HCL 25 MG/ML IJ SOLN: 6.2500 mg | INTRAMUSCULAR | Status: DC | PRN

## 2013-06-27 MED ORDER — LIDOCAINE HCL (CARDIAC) 20 MG/ML IV SOLN
INTRAVENOUS | Status: DC | PRN
  Administered 2013-06-27: 80 mg via INTRAVENOUS

## 2013-06-27 MED ORDER — HYDROMORPHONE HCL PF 1 MG/ML IJ SOLN: 1.0000 mg | INTRAMUSCULAR | Status: DC | PRN

## 2013-06-27 MED ORDER — ALUM & MAG HYDROXIDE-SIMETH 200-200-20 MG/5ML PO SUSP: 30.0000 mL | ORAL | Status: DC | PRN

## 2013-06-27 MED ORDER — FERROUS SULFATE 325 (65 FE) MG PO TABS
325.0000 mg | ORAL_TABLET | Freq: Three times a day (TID) | ORAL | Status: DC
  Administered 2013-06-28 – 2013-07-01 (×10): 325 mg via ORAL
  Filled 2013-06-27 (×14): qty 1

## 2013-06-27 MED ORDER — ONDANSETRON HCL 4 MG/2ML IJ SOLN: 4.0000 mg | Freq: Four times a day (QID) | INTRAMUSCULAR | Status: DC | PRN

## 2013-06-27 MED ORDER — CEFAZOLIN SODIUM 1-5 GM-% IV SOLN
1.0000 g | Freq: Four times a day (QID) | INTRAVENOUS | Status: AC
  Administered 2013-06-27 – 2013-06-28 (×2): 1 g via INTRAVENOUS
  Filled 2013-06-27 (×2): qty 50

## 2013-06-27 SURGICAL SUPPLY — 69 items
BAG ZIPLOCK 12X15 (MISCELLANEOUS) ×2 IMPLANT
BANDAGE ELASTIC 4 VELCRO ST LF (GAUZE/BANDAGES/DRESSINGS) ×2 IMPLANT
BANDAGE ELASTIC 6 VELCRO ST LF (GAUZE/BANDAGES/DRESSINGS) ×2 IMPLANT
BANDAGE ESMARK 6X9 LF (GAUZE/BANDAGES/DRESSINGS) ×1 IMPLANT
BLADE SAG 18X100X1.27 (BLADE) ×2 IMPLANT
BLADE SAW SGTL 11.0X1.19X90.0M (BLADE) ×2 IMPLANT
BNDG COHESIVE 4X5 TAN NS LF (GAUZE/BANDAGES/DRESSINGS) ×2 IMPLANT
BNDG ESMARK 6X9 LF (GAUZE/BANDAGES/DRESSINGS) ×2
BONE CEMENT GENTAMICIN (Cement) ×4 IMPLANT
CAPT RP KNEE ×2 IMPLANT
CEMENT BONE GENTAMICIN 40 (Cement) ×2 IMPLANT
CLOTH BEACON ORANGE TIMEOUT ST (SAFETY) ×2 IMPLANT
CUFF TOURN SGL QUICK 34 (TOURNIQUET CUFF) ×1
CUFF TRNQT CYL 34X4X40X1 (TOURNIQUET CUFF) ×1 IMPLANT
DERMABOND ADVANCED (GAUZE/BANDAGES/DRESSINGS) ×1
DERMABOND ADVANCED .7 DNX12 (GAUZE/BANDAGES/DRESSINGS) ×1 IMPLANT
DRAPE EXTREMITY T 121X128X90 (DRAPE) ×2 IMPLANT
DRAPE INCISE IOBAN 66X45 STRL (DRAPES) ×2 IMPLANT
DRAPE LG THREE QUARTER DISP (DRAPES) ×2 IMPLANT
DRAPE POUCH INSTRU U-SHP 10X18 (DRAPES) ×2 IMPLANT
DRAPE U-SHAPE 47X51 STRL (DRAPES) ×2 IMPLANT
DRSG AQUACEL AG ADV 3.5X10 (GAUZE/BANDAGES/DRESSINGS) ×2 IMPLANT
DRSG PAD ABDOMINAL 8X10 ST (GAUZE/BANDAGES/DRESSINGS) ×6 IMPLANT
DRSG TEGADERM 4X4.75 (GAUZE/BANDAGES/DRESSINGS) ×2 IMPLANT
DURAPREP 26ML APPLICATOR (WOUND CARE) ×2 IMPLANT
ELECT REM PT RETURN 9FT ADLT (ELECTROSURGICAL) ×2
ELECTRODE REM PT RTRN 9FT ADLT (ELECTROSURGICAL) ×1 IMPLANT
EVACUATOR 1/8 PVC DRAIN (DRAIN) ×2 IMPLANT
GAUZE SPONGE 2X2 8PLY STRL LF (GAUZE/BANDAGES/DRESSINGS) ×1 IMPLANT
GLOVE BIO SURGEON STRL SZ8.5 (GLOVE) ×2 IMPLANT
GLOVE BIOGEL PI IND STRL 8 (GLOVE) ×1 IMPLANT
GLOVE BIOGEL PI IND STRL 8.5 (GLOVE) ×1 IMPLANT
GLOVE BIOGEL PI INDICATOR 8 (GLOVE) ×1
GLOVE BIOGEL PI INDICATOR 8.5 (GLOVE) ×1
GLOVE ECLIPSE 8.0 STRL XLNG CF (GLOVE) ×4 IMPLANT
GLOVE SURG SS PI 6.5 STRL IVOR (GLOVE) ×4 IMPLANT
GLOVE SURG SS PI 7.5 STRL IVOR (GLOVE) ×6 IMPLANT
GOWN PREVENTION PLUS LG XLONG (DISPOSABLE) IMPLANT
GOWN STRL NON-REIN LRG LVL3 (GOWN DISPOSABLE) ×2 IMPLANT
GOWN STRL REIN XL XLG (GOWN DISPOSABLE) ×6 IMPLANT
HANDPIECE INTERPULSE COAX TIP (DISPOSABLE) ×1
IMMOBILIZER KNEE 20 (SOFTGOODS) ×2
IMMOBILIZER KNEE 20 THIGH 36 (SOFTGOODS) ×1 IMPLANT
KIT BASIN OR (CUSTOM PROCEDURE TRAY) ×2 IMPLANT
MANIFOLD NEPTUNE II (INSTRUMENTS) ×2 IMPLANT
NEEDLE HYPO 22GX1.5 SAFETY (NEEDLE) ×2 IMPLANT
NS IRRIG 1000ML POUR BTL (IV SOLUTION) ×2 IMPLANT
PACK TOTAL JOINT (CUSTOM PROCEDURE TRAY) ×2 IMPLANT
PADDING CAST COTTON 6X4 STRL (CAST SUPPLIES) ×2 IMPLANT
POSITIONER SURGICAL ARM (MISCELLANEOUS) ×2 IMPLANT
SET HNDPC FAN SPRY TIP SCT (DISPOSABLE) ×1 IMPLANT
SPONGE GAUZE 2X2 STER 10/PKG (GAUZE/BANDAGES/DRESSINGS) ×1
SPONGE LAP 18X18 X RAY DECT (DISPOSABLE) ×2 IMPLANT
SPONGE SURGIFOAM ABS GEL 100 (HEMOSTASIS) ×2 IMPLANT
STAPLER VISISTAT 35W (STAPLE) ×2 IMPLANT
SUT BONE WAX W31G (SUTURE) ×2 IMPLANT
SUT MNCRL AB 4-0 PS2 18 (SUTURE) ×2 IMPLANT
SUT VIC AB 1 CT1 27 (SUTURE) ×2
SUT VIC AB 1 CT1 27XBRD ANTBC (SUTURE) ×2 IMPLANT
SUT VIC AB 2-0 CT1 27 (SUTURE) ×2
SUT VIC AB 2-0 CT1 TAPERPNT 27 (SUTURE) ×2 IMPLANT
SUT VLOC 180 0 24IN GS25 (SUTURE) ×2 IMPLANT
SYR 20CC LL (SYRINGE) ×2 IMPLANT
TOWEL OR 17X26 10 PK STRL BLUE (TOWEL DISPOSABLE) ×4 IMPLANT
TOWER CARTRIDGE SMART MIX (DISPOSABLE) ×2 IMPLANT
TRAY FOLEY CATH 14FRSI W/METER (CATHETERS) IMPLANT
TRAY FOLEY CATH 16FRSI W/METER (SET/KITS/TRAYS/PACK) ×2 IMPLANT
WATER STERILE IRR 1500ML POUR (IV SOLUTION) ×4 IMPLANT
WRAP KNEE MAXI GEL POST OP (GAUZE/BANDAGES/DRESSINGS) ×4 IMPLANT

## 2013-06-27 NOTE — Transfer of Care (Signed)
Immediate Anesthesia Transfer of Care Note  Patient: William Roach  Procedure(s) Performed: Procedure(s): LEFT TOTAL KNEE ARTHROPLASTY (Left)  Patient Location: PACU  Anesthesia Type:General  Level of Consciousness: sedated  Airway & Oxygen Therapy: Patient Spontanous Breathing and Patient connected to face mask oxygen  Post-op Assessment: Report given to PACU RN and Post -op Vital signs reviewed and stable  Post vital signs: Reviewed and stable  Complications: No apparent anesthesia complications

## 2013-06-27 NOTE — Anesthesia Preprocedure Evaluation (Signed)
Anesthesia Evaluation  Patient identified by MRN, date of birth, ID band Patient awake    Reviewed: Allergy & Precautions, H&P , NPO status , Patient's Chart, lab work & pertinent test results  Airway Mallampati: II TM Distance: >3 FB Neck ROM: Full    Dental no notable dental hx.    Pulmonary neg pulmonary ROS,  breath sounds clear to auscultation  Pulmonary exam normal       Cardiovascular hypertension, + CAD and + CABG Rhythm:Regular Rate:Normal     Neuro/Psych negative neurological ROS  negative psych ROS   GI/Hepatic negative GI ROS, Neg liver ROS,   Endo/Other  Hypothyroidism   Renal/GU negative Renal ROS  negative genitourinary   Musculoskeletal negative musculoskeletal ROS (+)   Abdominal   Peds negative pediatric ROS (+)  Hematology negative hematology ROS (+)   Anesthesia Other Findings   Reproductive/Obstetrics negative OB ROS                           Anesthesia Physical Anesthesia Plan  ASA: III  Anesthesia Plan: General   Post-op Pain Management:    Induction: Intravenous  Airway Management Planned: Oral ETT  Additional Equipment:   Intra-op Plan:   Post-operative Plan: Extubation in OR  Informed Consent: I have reviewed the patients History and Physical, chart, labs and discussed the procedure including the risks, benefits and alternatives for the proposed anesthesia with the patient or authorized representative who has indicated his/her understanding and acceptance.   Dental advisory given  Plan Discussed with: CRNA and Surgeon  Anesthesia Plan Comments:         Anesthesia Quick Evaluation

## 2013-06-27 NOTE — Anesthesia Postprocedure Evaluation (Signed)
Anesthesia Post Note  Patient: William Roach  Procedure(s) Performed: Procedure(s) (LRB): LEFT TOTAL KNEE ARTHROPLASTY (Left)  Anesthesia type: General  Patient location: PACU  Post pain: Pain level controlled  Post assessment: Post-op Vital signs reviewed  Last Vitals: BP 118/69  Pulse 67  Temp(Src) 36.5 C (Oral)  Resp 16  Ht 5\' 8"  (1.727 m)  Wt 151 lb (68.493 kg)  BMI 22.96 kg/m2  SpO2 98%  Post vital signs: Reviewed  Level of consciousness: sedated  Complications: No apparent anesthesia complications

## 2013-06-27 NOTE — Interval H&P Note (Signed)
History and Physical Interval Note:  06/27/2013 1:04 PM  Karen Kitchens  has presented today for surgery, with the diagnosis of Osteoarthritis of the Left Knee  The various methods of treatment have been discussed with the patient and family. After consideration of risks, benefits and other options for treatment, the patient has consented to  Procedure(s): LEFT TOTAL KNEE ARTHROPLASTY (Left) as a surgical intervention .  The patient's history has been reviewed, patient examined, no change in status, stable for surgery.  I have reviewed the patient's chart and labs.  Questions were answered to the patient's satisfaction.     Carletha Dawn A

## 2013-06-27 NOTE — Brief Op Note (Signed)
06/27/2013  3:34 PM  PATIENT:  William Roach  79 y.o. 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  SURGEON:  Surgeon(s) and Role:    * Jacki Cones, MD - Primary  PHYSICIAN ASSISTANT:Amber [place] PA   ASSISTANTS: Dimitri Ped PA   ANESTHESIA:   general  EBL:  Total I/O In: 1000 [I.V.:1000] Out: 1000 [Urine:1000]  BLOOD ADMINISTERED:none  DRAINS: (one) Hemovact drain(s) in the Left Knee with  Suction Open   LOCAL MEDICATIONS USED:  BUPIVICAINE 20cc mixed with 40cc of Normal Saline  SPECIMEN:  No Specimen  DISPOSITION OF SPECIMEN:  N/A  COUNTS:  YES  TOURNIQUET:  * Missing tourniquet times found for documented tourniquets in log:  [id number] *  DICTATION: .Other Dictation: Dictation Number [id number]  PLAN OF CARE: Admit to inpatient   PATIENT DISPOSITION:  Stable in OR   Delay start of Pharmacological VTE agent (>24hrs) due to surgical blood loss or risk of bleeding: yes

## 2013-06-28 LAB — BASIC METABOLIC PANEL
BUN: 19 mg/dL (ref 6–23)
Chloride: 103 mEq/L (ref 96–112)
Creatinine, Ser: 0.83 mg/dL (ref 0.50–1.35)
GFR calc non Af Amer: 75 mL/min — ABNORMAL LOW (ref >90)
Glucose, Bld: 115 mg/dL — ABNORMAL HIGH (ref 70–99)
Potassium: 4.1 mEq/L (ref 3.5–5.1)

## 2013-06-28 LAB — CBC
HCT: 32 % — ABNORMAL LOW (ref 39.0–52.0)
Hemoglobin: 11 g/dL — ABNORMAL LOW (ref 13.0–17.0)
MCH: 32.3 pg (ref 26.0–34.0)
MCHC: 34.4 g/dL (ref 30.0–36.0)
MCV: 93.8 fL (ref 78.0–100.0)

## 2013-06-28 MED ORDER — LORAZEPAM 2 MG/ML IJ SOLN
0.5000 mg | Freq: Three times a day (TID) | INTRAMUSCULAR | Status: DC | PRN
  Administered 2013-06-28: 19:00:00 0.5 mg via INTRAVENOUS

## 2013-06-28 MED ORDER — LORAZEPAM 2 MG/ML IJ SOLN
INTRAMUSCULAR | Status: AC
  Filled 2013-06-28: qty 1

## 2013-06-28 MED ORDER — TAMSULOSIN HCL 0.4 MG PO CAPS
0.4000 mg | ORAL_CAPSULE | Freq: Every day | ORAL | Status: DC
  Administered 2013-06-29 – 2013-07-01 (×4): 0.4 mg via ORAL
  Filled 2013-06-28 (×4): qty 1

## 2013-06-28 NOTE — Progress Notes (Signed)
Called Urologist on call Cordella Register who asked that we have Urology cart at bedside and he would be here as soon as possible. Coude cath Fr #[id number] and foley cath bag at bedside.

## 2013-06-28 NOTE — Progress Notes (Signed)
Patient has not voided post foley catheter removal and bladder scan was for >600 cc's. Patient initially states he will not allow me to insert catheter and patients RN has 15 minute discussion with patient and patients son about need for foley catheter to be placed. Patient finally agrees after discussion. Attempted to insert 16 fr coude catheter. Patient confused and tries to grab at RN's hands during insertion. Met great deal of resistance at level of prostate and unable to insert coude cath at this time. Jeannie (6th floor RN) will notify patients urologist for further instructions. I will be available to re-attempt insertion and Beverely Low has my direct number. Ginny Forth

## 2013-06-28 NOTE — Progress Notes (Signed)
Pt unable to urinate. Bladder scan amount >600. Attempted to I&O cath pt per protocol and met resistance so did not continue. Pt's confusion is getting worse throughout this shift, and agitation leading to pt trying to get out of bed. On-call PA paged, orders received.

## 2013-06-28 NOTE — Evaluation (Signed)
Physical Therapy Evaluation Patient Details Name: William Roach MRN: [id number] DOB: 1921-07-17 Today's Date: 06/28/2013 Time: 4098-1191 PT Time Calculation (min): 17 min  PT Assessment / Plan / Recommendation History of Present Illness  79 yo male s/p L TKA 9/26. Hx of CAD, CABG, dizziness.   Clinical Impression  On eval, pt required Min assist for mobility-able to ambulate ~60 feet with RW. Recommend SNF at [facility].     PT Assessment  Patient needs continued PT services    Follow Up Recommendations  SNF    Does the patient have the potential to tolerate intense rehabilitation      Barriers to Discharge        Equipment Recommendations       Recommendations for Other Services OT consult   Frequency 7X/week    Precautions / Restrictions Precautions Precautions: Fall;Knee Required Braces or Orthoses: Knee Immobilizer - Left Knee Immobilizer - Left: Discontinue once straight leg raise with < 10 degree lag Restrictions Weight Bearing Restrictions: No LLE Weight Bearing: Weight bearing as tolerated   Pertinent Vitals/Pain L knee 5/10. Ice applied end of session      Mobility  Bed Mobility Bed Mobility: Supine to Sit Supine to Sit: 4: Min assist;HOB elevated;With rails Details for Bed Mobility Assistance: Assist for L LE off bed. VCS safety, technique, hand placement.  Transfers Transfers: Sit to Stand;Stand to Sit Sit to Stand: 3: Mod assist;From chair/3-in-1 Stand to Sit: 3: Mod assist;To chair/3-in-1 Details for Transfer Assistance: Assist to rise, stabilize, control descent.VCs safety, technique, hand placement Ambulation/Gait Ambulation/Gait Assistance: 4: Min assist Ambulation Distance (Feet): 60 Feet Assistive device: Rolling walker Ambulation/Gait Assistance Details: assist to stabilize throughout ambulation. VCs safety, sequence, distance from RW.  Gait Pattern: Step-through pattern;Step-to pattern;Trunk flexed;Antalgic    Exercises     PT  Diagnosis: Difficulty walking;Abnormality of gait;Acute pain  PT Problem List: Decreased strength;Decreased range of motion;Decreased mobility;Decreased activity tolerance;Pain;Decreased knowledge of use of DME PT Treatment Interventions: DME instruction;Gait training;Functional mobility training;Therapeutic activities;Therapeutic exercise;Patient/family education     PT Goals(Current goals can be found in the care plan section) Acute Rehab PT Goals Patient Stated Goal: To go to SNF at [facility] PT Goal Formulation: With patient Time For Goal Achievement: 07/05/13 Potential to Achieve Goals: Good  Visit Information  Last PT Received On: 06/28/13 Assistance Needed: +1 PT/OT Co-Evaluation/Treatment: Yes History of Present Illness: 79 yo male s/p L TKA 9/26. Hx of CAD, CABG, dizziness.        Prior Functioning  Home Living Family/patient expects to be discharged to:: Skilled nursing facility Home Equipment: Walker - 4 wheels Prior Function Level of Independence: Independent with assistive device(s) Communication Communication: HOH Dominant Hand: Right    Cognition  Cognition Arousal/Alertness: Awake/Roach Behavior During Therapy: WFL for tasks assessed/performed Overall Cognitive Status: Within Functional Limits for tasks assessed    Extremity/Trunk Assessment Upper Extremity Assessment Upper Extremity Assessment: Generalized weakness Lower Extremity Assessment Lower Extremity Assessment: LLE deficits/detail LLE Deficits / Details: hip flex 2/5, moves ankle well Cervical / Trunk Assessment Cervical / Trunk Assessment: Normal   Balance    End of Session PT - End of Session Equipment Utilized During Treatment: Gait belt Activity Tolerance: Patient tolerated treatment well Patient left: in chair;with call bell/phone within reach;with family/visitor present  GP     William Roach, MPT Pager: [phone]

## 2013-06-28 NOTE — Procedures (Signed)
Pre-procedure diagnosis: difficult foley catheterization Post-procedure diagnosis: as above  Procedure performed: placement of complicated foley  Surgeon: Dr. Crist Fat  Findings: fixed/fibrotic prostate  Specimen: none Drains: 49F coude tipped foley  Indications:  Patient unable to void on his own.  Nursing staff have been unsuccessful at placing catheter.  Procedure: Gentials were prepped and draped in the routine sterile fashion.  10cc of 1% viscous lidocaine jelly was then injected into the patient's urethra.  A 49F coude tipped catheter was then gently passed in to the urethral.  Resistance was met at the prostate, but with gentle pressue the catheter slid through the prostate and into the bladder.  Clear yellow urine was returned.  Patient tolerated the procedure well - no immediate issues.  Disposition: Start flomax, repeat voiding trial in 5-7 days.

## 2013-06-28 NOTE — Progress Notes (Signed)
Physical Therapy Treatment Patient Details Name: William Roach MRN: [id number] DOB: 1921/09/18 Today's Date: 06/28/2013 Time: 4098-1191 PT Time Calculation (min): 23 min  PT Assessment / Plan / Recommendation  History of Present Illness 79 yo male s/p L TKA 9/26. Hx of CAD, CABG, dizziness.    PT Comments   Progressing with mobility.   Follow Up Recommendations  SNF     Does the patient have the potential to tolerate intense rehabilitation     Barriers to Discharge        Equipment Recommendations  None recommended by PT    Recommendations for Other Services OT consult  Frequency 7X/week   Progress towards PT Goals Progress towards PT goals: Progressing toward goals  Plan Current plan remains appropriate    Precautions / Restrictions Precautions Precautions: Knee;Fall Required Braces or Orthoses: Knee Immobilizer - Left Knee Immobilizer - Left: Discontinue once straight leg raise with < 10 degree lag Restrictions Weight Bearing Restrictions: No LLE Weight Bearing: Weight bearing as tolerated   Pertinent Vitals/Pain 5/10 L knee. Ice applied end of session    Mobility  Bed Mobility Bed Mobility: Supine to Sit;Sit to Supine Supine to Sit: 4: Min assist Details for Bed Mobility Assistance: Assist for L LE off bed. VCS safety, technique, hand placement.  Transfers Transfers: Sit to Stand;Stand to Sit Sit to Stand: 3: Mod assist;From bed Stand to Sit: 4: Min assist;To bed Details for Transfer Assistance: Assist to rise, stabilize, control descent.VCs safety, technique, hand placement Ambulation/Gait Ambulation/Gait Assistance: 4: Min assist Ambulation Distance (Feet): 100 Feet Assistive device: Rolling walker Ambulation/Gait Assistance Details: assist to stabilize throughout ambulation. VCs safety, sequence, distance from RW.  Gait Pattern: Step-through pattern;Decreased stride length;Trunk flexed;Antalgic    Exercises Total Joint Exercises Ankle Circles/Pumps:  AROM;Both;15 reps;Supine Quad Sets: AROM;Both;15 reps;Supine Heel Slides: AAROM;Left;10 reps;Supine Hip ABduction/ADduction: AROM;Left;10 reps;Supine Straight Leg Raises: AROM;Left;10 reps;Supine Goniometric ROM: 15-45 degrees supine   PT Diagnosis: Difficulty walking;Abnormality of gait;Acute pain  PT Problem List: Decreased strength;Decreased range of motion;Decreased mobility;Decreased activity tolerance;Pain;Decreased knowledge of use of DME PT Treatment Interventions: DME instruction;Gait training;Functional mobility training;Therapeutic activities;Therapeutic exercise;Patient/family education   PT Goals (current goals can now be found in the care plan section) Acute Rehab PT Goals Patient Stated Goal: To go to SNF at [name] PT Goal Formulation: With patient Time For Goal Achievement: 07/05/13 Potential to Achieve Goals: Good  Visit Information  Last PT Received On: 06/28/13 Assistance Needed: +1 PT/OT Co-Evaluation/Treatment: Yes History of Present Illness: 79 yo male s/p L TKA 9/26. Hx of CAD, CABG, dizziness.     Subjective Data  Patient Stated Goal: To go to SNF at [name]   Cognition  Cognition Arousal/Alertness: Awake/alert Behavior During Therapy: [name] for tasks assessed/performed Overall Cognitive Status: Within Functional Limits for tasks assessed    Balance     End of Session PT - End of Session Equipment Utilized During Treatment: Gait belt Activity Tolerance: Patient tolerated treatment well Patient left: in bed;with call bell/phone within reach   GP   [name], MPT Pager: [phone]    .

## 2013-06-28 NOTE — Progress Notes (Signed)
   Subjective: 1 Day Post-Op Procedure(s) (LRB): LEFT TOTAL KNEE ARTHROPLASTY (Left)  Pt doing well Minimal to no pain currently in left knee Patient reports pain as mild.  Objective:   VITALS:   Filed Vitals:   06/28/13 0740  BP:   Pulse:   Temp:   Resp: 18    Left knee incision healing well nv intact distally Knee immobilizer and dressing intact Drain pulled  LABS  Recent Labs  06/28/13 0541  HGB 11.0*  HCT 32.0*  WBC 15.2*  PLT 138*     Recent Labs  06/28/13 0541  NA 136  K 4.1  BUN 19  CREATININE 0.83  GLUCOSE 115*     Assessment/Plan: 1 Day Post-Op Procedure(s) (LRB): LEFT TOTAL KNEE ARTHROPLASTY (Left)  PT/OT D/c planning depending on progress Pulmonary toilet dvt prophylaxis   Alphonsa Overall, MPAS, PA-C  06/28/2013, 7:55 AM

## 2013-06-28 NOTE — Op Note (Signed)
NAMERUDI, BUNYARD NO.:  [id number]  MEDICAL RECORD NO.:  [id number]  LOCATION:  1616                         FACILITY:  [facility]  PHYSICIAN:  Georges Lynch. Sidharth Leverette, M.D.DATE OF BIRTH:  1921/06/15  DATE OF PROCEDURE:  06/27/2013 DATE OF DISCHARGE:                              OPERATIVE REPORT   SURGEON:  Georges Lynch. Darrelyn Hillock, MD  ASSISTANT:  Dimitri Ped, PA  PREOPERATIVE DIAGNOSIS: 1. Flexion contracture of the left knee. 2. Severe osteoarthritis left knee with bone on bone.  POSTOPERATIVE DIAGNOSES: 1. Flexion contracture of the left knee. 2. Severe osteoarthritis left knee with bone on bone.  OPERATION:  Left total knee arthroplasty.  I did utilize a [employer].  All 3 components were cemented.  The sizes used a size 5 femur, size 38 patella with 3 pegs.  Tibial tray was a size 4.  The insert was a rotating platform, 10 mm thickness size 5.  The gentamicin was used as a cement.  PROCEDURE:  Under general anesthesia, routine orthopedic prep and draping of the left lower extremity was carried out.  He had 2 g of IV Ancef.  At this time, the appropriate time-out was carried out.  I also marked the appropriate left leg in the holding area.  Following that, the leg was exsanguinated with Esmarch, tourniquet was elevated at 300 mmHg.  The knee was flexed and incision was made over the anterior aspect of the left knee.  Bleeders were identified and cauterized.  Two flaps were created.  I then carried out a median parapatellar incision, reflected the patella laterally, flexed the knee and did medial and lateral meniscectomies.  At this time, I excised the anterior and posterior cruciate ligament as well.  I had to do a nice medial release because of severe contracture.  We had to go back posteriorly and do a release as well.  After this, the initial drill hole was made in the distal femur.  The canal finder was inserted.  I thoroughly irrigated out the  canal.  I then removed 12 mm thickness off the distal femur.  At this time, we then measured the femur to be a size 5.  I made the appropriate anterior, posterior and chamfering cuts for a size 5 left femoral component.  At this time, we then went down and removed all the spurs from the tibia and then measured the tibia tray size to be a size 4.  We then made our initial drill hole in the tibial plateau, and I removed approximately 6-mm thickness off the tibia.  We still had to go back and remove 2 more because the knee was extremely tight, and the medial plateau was severely depressed.  Following that, after making the appropriate cuts we then continued our releases and inserted our spacer blocks to measure our tension gaps.  At this time, I then continued to prepare the tibia and a keel cut was made in the tibial plateau. Following that, we did our notch cut out of the distal femur.  Trial components were cemented.  We were able to get a 10 mm thickness insert for the tibia.  We reduced the knee and then  did our resurfacing procedure on the patella.  Patella was extremely hard bone.  We went through several measurements to make sure we were down about 14 mm thickness block.  I then made 3 drill holes in the patella for a size 38 patella.  Following that, all trial components were removed.  We thoroughly water picked out the knee, cemented all 3 components in simultaneously, and gentamicin was used in the cement.  Once the cement was hardened, we removed all loose pieces of cement.  At this time, I then water picked out the knee posteriorly to make sure there were no other loose pieces of cement or bone fragment.  I then injected a mixture of 20 mL of Exparel with 40 mL of normal saline.  I used part of that mixture to inject the surrounding soft tissue areas.  At this time, we then inserted our permanent 10 mm thickness, a size 5 rotating platform, reduced the knee, had nice function  there at this time and good tension as well.  We then checked for cement again and I did put some thrombin-soaked Gelfoam posteriorly as well.  We then closed the knee in the layers in the usual fashion over a Hemovac drain.          ______________________________ Georges Lynch. Darrelyn Hillock, M.D.     RAG/MEDQ  D:  06/27/2013  T:  06/28/2013  Job:  [id number]

## 2013-06-28 NOTE — Progress Notes (Signed)
Pt has become progressively more confused over the course of this shift. Will leave Foley Cath this am and let am shift decide if needs to be d/c'd. Will continue to assess and make am shift aware.

## 2013-06-28 NOTE — Consult Note (Signed)
H&P  Chief Complaint: urinary retention  History of Present Illness: William Roach is a 79 y.o. year old s/p left total knee replacement POD#1.  Patient had his foley catheter removed this AM and hasn't been able to void since.  Several attempts at clean intermittent catheterization were unsuccessful.  As such I was called for a difficult foley placement.  Bladder scan was ~650cc.  Patient denies any pain/urge to void.  Patient has a history of prostate related issues.   He underwent a transurethral resection of his prostate back in 1999.  He was incidentally noted to have some adenocarcinoma of the prostate.  He had about 15% of the chips involved with a Gleason 7 cancer.  We decided active surveillance would be in his best interest.  His PSA did start to increase and therefore he was treated with external beam radiation therapy which finished in 2001.  His PSA has essentially come all the way down to 0.  He has done quite well with regard to his voiding and has been very pleased.  Nocturia x1 with a good stream.  No other difficulties or problems and he really has no other urologic complaints.    Past Medical History  Diagnosis Date  . Coronary artery disease     post CABG x4   . Hypertension   . Hypercholesterolemia   . Fatigue   . Dizziness   . Basal cell carcinoma, ear     post -- partial pinnectomy left side with primary closure    . Hypothyroidism   . History of kidney stones     x1    Past Surgical History  Procedure Laterality Date  . Coronary artery bypass graft  2003    x4 --   . Cholecystectomy  2002  . Inguinal hernia repair  09/19/2007  . Ear cyst excision  12/14/2009    Excision of left auricular cyst with primary closure  . Basal cell carcinoma excision  01/14/2010    Wedge resection, partial pinnectomy left side with primary closure      . Tonsillectomy    . Carpal tunnel release    . Cataract surgery Bilateral 06-24-13  . Tonsillectomy    . Cystoscopy       Home Medications:    Medication List    ASK your doctor about these medications       acetaminophen 500 MG tablet  Commonly known as:  TYLENOL  Take 1,000 mg by mouth daily.     aspirin 81 MG tablet  Take 81 mg by mouth daily.     CALTRATE PLUS PO  Take 1 tablet by mouth daily.     diclofenac 75 MG EC tablet  Commonly known as:  VOLTAREN  Take 75 mg by mouth 2 (two) times daily.     levothyroxine 50 MCG tablet  Commonly known as:  SYNTHROID, LEVOTHROID  Take 50 mcg by mouth daily before breakfast.     multivitamin with minerals Tabs tablet  Take 1 tablet by mouth daily.     OSTEO BI-FLEX TRIPLE STRENGTH PO  Take 1 capsule by mouth 2 (two) times daily.     vitamin C 500 MG tablet  Commonly known as:  ASCORBIC ACID  Take 500 mg by mouth daily.        Allergies: No Known Allergies  Family History  Problem Relation Age of Onset  . Parkinsonism Father   . Angina Mother   . Coronary artery disease Brother  with CABG    Social History:  reports that he quit smoking about 32 years ago. He does not have any smokeless tobacco history on file. He reports that he does not drink alcohol or use illicit drugs.  ROS: A complete review of systems was performed.  All systems are negative except for pertinent findings as noted.  Physical Exam:  Vital signs in last 24 hours: Temp:  [97.7 F (36.5 C)-98.3 F (36.8 C)] 98.2 F (36.8 C) (09/27 1415) Pulse Rate:  [63-76] 76 (09/27 1415) Resp:  [16-18] 16 (09/27 1508) BP: (110-129)/(56-71) 110/56 mmHg (09/27 1415) SpO2:  [96 %-100 %] 96 % (09/27 1415) Constitutional:  Alert and oriented, No acute distress Cardiovascular: Regular rate, No JVD Respiratory: Normal respiratory effort, Lungs clear bilaterally GI: Abdomen is soft, nontender, nondistended, no abdominal masses GU: circ., orthotopic meatus, no penile/scrotal lesions Neurologic: Grossly intact, no focal deficits Psychiatric: Normal mood and  affect   Laboratory Data:   Recent Labs  06/28/13 0541  WBC 15.2*  HGB 11.0*  HCT 32.0*    Recent Labs  06/28/13 0541  NA 136  K 4.1  CL 103  CO2 26  GLUCOSE 115*  BUN 19  CREATININE 0.83  CALCIUM 8.6   No results found for this basename: LABPT, INR,  in the last 72 hours No results found for this basename: LABURIN,  in the last 72 hours Results for orders placed during the hospital encounter of 06/24/13  SURGICAL PCR SCREEN     Status: None   Collection Time    06/24/13  2:14 PM      Result Value Range Status   MRSA, PCR NEGATIVE  NEGATIVE Final   Staphylococcus aureus NEGATIVE  NEGATIVE Final   Comment:            The Xpert SA Assay (FDA     approved for NASAL specimens     in patients over 36 years of age),     is one component of     a comprehensive surveillance     program.  Test performance has     been validated by [employer] for patients greater     than or equal to 3 year old.     It is not intended     to diagnose infection nor to     guide or monitor treatment.     Radiologic Imaging: Dg Knee Left Port  06/27/2013   *RADIOLOGY REPORT*  Clinical Data: Status post left total knee replacement.  PORTABLE LEFT KNEE - 1-2 VIEW  Comparison: 05/29/2013.  Findings: The patient is status post left total knee arthroplasty. Subcutaneous and joint air and fluid are noted.  Surgical drain is seen along the lateral aspect of the knee.  Vascular calcifications are incidentally noted.  IMPRESSION: Interval left knee arthroplasty with expected postoperative findings.   Original Report Authenticated By: Leanna Battles, M.D.    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  Plan:  Foley catheter placed.  Flomax started.  Would recommend leaving the catheter until  patient is more mobile patient and is further out from procedure.  Will set up a voiding trial in our office in 5-7 days.    06/28/2013, 9:50 PM  Crist Fat,  MD

## 2013-06-28 NOTE — Progress Notes (Signed)
Called Urologist on call [phone]), as pt is unable to void post F/C removal 1340, and inability to I/O cath with straight Foley.

## 2013-06-28 NOTE — Evaluation (Signed)
Occupational Therapy Evaluation and Discharge Patient Details Name: William Roach MRN: [id number] DOB: 09/19/1921 Today's Date: 06/28/2013 Time: 4098-1191 OT Time Calculation (min): 13 min  OT Assessment / Plan / Recommendation History of present illness LTKA   Clinical Impression   This 79 yo male presents to acute OT with problems below. Will benefit from continued OT at SNF to get back to his Mod I level. Acute OT will sign off and defer remainder of OT to SNF.    OT Assessment  All further OT needs can be met in the next venue of care    Follow Up Recommendations  Home health OT       Equipment Recommendations   (TBD at next venue)          Precautions / Restrictions Precautions Precautions: Knee;Fall Required Braces or Orthoses: Knee Immobilizer - Left Knee Immobilizer - Left: Discontinue once straight leg raise with < 10 degree lag Restrictions Weight Bearing Restrictions: No       ADL  Equipment Used: Gait belt;Rolling walker Transfers/Ambulation Related to ADLs: Min A for all with RW ADL Comments: Setup/S for UBADLs; Max A to total A for LBADLs    OT Diagnosis: Generalized weakness;Acute pain  OT Problem List: Decreased strength;Impaired balance (sitting and/or standing);Decreased knowledge of use of DME or AE;Impaired sensation    Acute Rehab OT Goals Patient Stated Goal: To go to SNF at [facility]  Visit Information  Last OT Received On: 06/28/13 Assistance Needed: +1 PT/OT Co-Evaluation/Treatment: Yes (partial) History of Present Illness: LTKA       Prior Functioning     Home Living Family/patient expects to be discharged to:: Skilled nursing facility Home Equipment: Walker - 4 wheels Prior Function Level of Independence: Independent with assistive device(s) Communication Communication: HOH Dominant Hand: Right         Vision/Perception Vision - History Baseline Vision: Wears glasses for distance only Patient Visual Report: No  change from baseline   Cognition  Cognition Arousal/Alertness: Awake/alert Behavior During Therapy: WFL for tasks assessed/performed Overall Cognitive Status: Within Functional Limits for tasks assessed    Extremity/Trunk Assessment Upper Extremity Assessment Upper Extremity Assessment: Generalized weakness     Mobility Transfers Transfers: Sit to Stand;Stand to Sit Sit to Stand: 4: Min assist;With upper extremity assist;With armrests;From chair/3-in-1 Stand to Sit: 4: Min assist;With upper extremity assist;With armrests;To chair/3-in-1 Details for Transfer Assistance: VCs for LLE and hand placement           End of Session OT - End of Session Equipment Utilized During Treatment: Gait belt;Rolling walker Activity Tolerance: Patient tolerated treatment well Patient left: in chair;with call bell/phone within reach;with family/visitor present Nurse Communication: Mobility status       Evette Georges [phone] 06/28/2013, 10:48 AM

## 2013-06-29 LAB — BASIC METABOLIC PANEL
BUN: 22 mg/dL (ref 6–23)
CO2: 24 mEq/L (ref 19–32)
Calcium: 8.5 mg/dL (ref 8.4–10.5)
Chloride: 102 mEq/L (ref 96–112)
Creatinine, Ser: 0.81 mg/dL (ref 0.50–1.35)
Glucose, Bld: 121 mg/dL — ABNORMAL HIGH (ref 70–99)

## 2013-06-29 LAB — CBC
HCT: 28.2 % — ABNORMAL LOW (ref 39.0–52.0)
Hemoglobin: 9.6 g/dL — ABNORMAL LOW (ref 13.0–17.0)
MCH: 31.7 pg (ref 26.0–34.0)
MCHC: 34 g/dL (ref 30.0–36.0)
MCV: 93.1 fL (ref 78.0–100.0)
Platelets: 113 10*3/uL — ABNORMAL LOW (ref 150–400)
RBC: 3.03 MIL/uL — ABNORMAL LOW (ref 4.22–5.81)
WBC: 12.9 10*3/uL — ABNORMAL HIGH (ref 4.0–10.5)

## 2013-06-29 NOTE — Progress Notes (Signed)
Physical Therapy Treatment Patient Details Name: William Roach MRN: [id number] DOB: July 14, 1921 Today's Date: 06/29/2013 Time: 1200-1217 PT Time Calculation (min): 17 min  PT Assessment / Plan / Recommendation  History of Present Illness 79 yo male s/p L TKA 9/26. Hx of CAD, CABG, dizziness.    PT Comments   Progressing slowly with mobility. Pt with increased difficulty with ambulation this session.   Follow Up Recommendations  SNF     Does the patient have the potential to tolerate intense rehabilitation     Barriers to Discharge        Equipment Recommendations  None recommended by PT    Recommendations for Other Services OT consult  Frequency 7X/week   Progress towards PT Goals Progress towards PT goals: Progressing toward goals (slowly)  Plan Current plan remains appropriate    Precautions / Restrictions Precautions Precautions: Knee;Fall Required Braces or Orthoses: Knee Immobilizer - Left Knee Immobilizer - Left: Discontinue once straight leg raise with < 10 degree lag Restrictions Weight Bearing Restrictions: No LLE Weight Bearing: Weight bearing as tolerated   Pertinent Vitals/Pain L knee with activity-unrated. Ice applied end of session    Mobility  Bed Mobility Bed Mobility: Supine to Sit Supine to Sit: 4: Min assist Details for Bed Mobility Assistance: Assist for L LE off bed. VCS safety, technique, hand placement.  Transfers Transfers: Sit to Stand;Stand to Sit Sit to Stand: 3: Mod assist;From bed Stand to Sit: 4: Min assist;To bed Details for Transfer Assistance: Assist to rise, stabilize, control descent.VCs safety, technique, hand placement Ambulation/Gait Ambulation/Gait Assistance: 4: Min assist Ambulation Distance (Feet): 60 Feet Assistive device: Rolling walker Ambulation/Gait Assistance Details: Increased difficulty with ambulation this session. Noted some buckling of L LE even with KI in place. Assist to stabilize throughout ambulation. 2-3  brief standing rest breaks. VCS safety, posture, sequence.  Gait Pattern: Decreased stride length;Decreased step length - right;Decreased step length - left;Step-to pattern;Step-through pattern;Trunk flexed;Antalgic    Exercises     PT Diagnosis:    PT Problem List:   PT Treatment Interventions:     PT Goals (current goals can now be found in the care plan section)    Visit Information  Last PT Received On: 06/29/13 Assistance Needed: +1 History of Present Illness: 79 yo male s/p L TKA 9/26. Hx of CAD, CABG, dizziness.     Subjective Data      Cognition  Cognition Arousal/Alertness: Awake/alert Behavior During Therapy: WFL for tasks assessed/performed Overall Cognitive Status: History of cognitive impairments - at baseline    Balance     End of Session PT - End of Session Equipment Utilized During Treatment: Gait belt Activity Tolerance: Patient limited by fatigue;Patient limited by pain Patient left: in chair;with call bell/phone within reach;with family/visitor present   GP     [name], MPT Pager: [phone]

## 2013-06-29 NOTE — Progress Notes (Signed)
Updated Pt's son, with Pt's permission, about SNF procedure.  Son stated that they signed paperwork with Wille Celeste at [facility] on Thursday.  CSW thanked Pt's son for his time.  Weekday CSW to follow.  Providence Crosby, LCSWA Clinical Social Work [phone]

## 2013-06-29 NOTE — Progress Notes (Signed)
Physical Therapy Treatment Patient Details Name: William Roach MRN: [id number] DOB: 04-20-1921 Today's Date: 06/29/2013 Time: 1440-1506 PT Time Calculation (min): 26 min  PT Assessment / Plan / Recommendation  History of Present Illness 79 yo male s/p L TKA 9/26. Hx of CAD, CABG, dizziness.    PT Comments     Follow Up Recommendations  SNF     Does the patient have the potential to tolerate intense rehabilitation     Barriers to Discharge        Equipment Recommendations  None recommended by PT    Recommendations for Other Services OT consult  Frequency 7X/week   Progress towards PT Goals Progress towards PT goals: Progressing toward goals  Plan Current plan remains appropriate    Precautions / Restrictions Precautions Precautions: Knee;Fall Required Braces or Orthoses: Knee Immobilizer - Left Knee Immobilizer - Left: Discontinue once straight leg raise with < 10 degree lag Restrictions Weight Bearing Restrictions: No LLE Weight Bearing: Weight bearing as tolerated   Pertinent Vitals/Pain L knee-unrated. Ice applied end of session    Mobility  Bed Mobility Bed Mobility: Sit to Supine Supine to Sit: 4: Min assist Sit to Supine: 3: Mod assist Details for Bed Mobility Assistance: Assist for trunk and L LE. VCs safety, technique, hand placement Transfers Transfers: Sit to Stand;Stand to Sit Sit to Stand: 3: Mod assist Stand to Sit: 4: Min assist Details for Transfer Assistance: Assist to rise, stabilize, control descent. LOB x 1 posteriorly-external assist to prevent fall. VCs safety, technique, hand placement Ambulation/Gait Ambulation/Gait Assistance: 4: Min assist Ambulation Distance (Feet): 60 Feet Assistive device: Rolling walker Ambulation/Gait Assistance Details: Assist to stabilize throughout ambulation. Fatigues easily. Pt tended to maintain flexed posturing (trunk, kness) especially towards end of walk. Dyspnea 2/4.  Gait Pattern: Decreased stride  length;Decreased step length - right;Decreased step length - left;Step-to pattern;Step-through pattern;Trunk flexed;Antalgic;Right flexed knee in stance;Left flexed knee in stance    Exercises Total Joint Exercises Ankle Circles/Pumps: AROM;Both;15 reps;Supine Quad Sets: AROM;Both;15 reps;Supine Heel Slides: AAROM;Left;15 reps;Supine Hip ABduction/ADduction: AAROM;AROM;Left;15 reps;Supine Straight Leg Raises: AAROM;Left;15 reps;Supine   PT Diagnosis:    PT Problem List:   PT Treatment Interventions:     PT Goals (current goals can now be found in the care plan section)    Visit Information  Last PT Received On: 06/29/13 Assistance Needed: +1 History of Present Illness: 79 yo male s/p L TKA 9/26. Hx of CAD, CABG, dizziness.     Subjective Data      Cognition  Cognition Arousal/Alertness: Awake/alert Behavior During Therapy: WFL for tasks assessed/performed Overall Cognitive Status: History of cognitive impairments - at baseline    Balance     End of Session PT - End of Session Equipment Utilized During Treatment: Gait belt Activity Tolerance: Patient limited by fatigue;Patient limited by pain Patient left: in bed;with call bell/phone within reach;with family/visitor present   GP     [name], MPT Pager: [phone]

## 2013-06-29 NOTE — Progress Notes (Addendum)
   Subjective: 2 Days Post-Op Procedure(s) (LRB): LEFT TOTAL KNEE ARTHROPLASTY (Left)   Patient reports pain as mild, pain controlled. Son states that he has been more confused than normal, though he has been declining over the last 6 months while at home.   Objective:   VITALS:   Filed Vitals:   06/29/13 0524  BP: 108/60  Pulse: 80  Temp: 98.2 F (36.8 C)  Resp: 16    Neurovascular intact Dorsiflexion/Plantar flexion intact Incision: dressing C/D/I No cellulitis present Compartment soft  LABS  Recent Labs  06/28/13 0541 06/29/13 0540  HGB 11.0* 9.6*  HCT 32.0* 28.2*  WBC 15.2* 12.9*  PLT 138* 113*     Recent Labs  06/28/13 0541 06/29/13 0540  NA 136 135  K 4.1 3.7  BUN 19 22  CREATININE 0.83 0.81  GLUCOSE 115* 121*     Assessment/Plan: 2 Days Post-Op Procedure(s) (LRB): LEFT TOTAL KNEE ARTHROPLASTY (Left) Up with therapy Discharge to SNF eventually, when ready Dr. Isabel Caprice saw the patient this morning, recommends sending to rehab with foley. They will arrange outpt foley in our office for voiding trial 5-7 days after discharge.  Expected ABLA  Treated with iron and will observe       Anastasio Auerbach. Ryann Pauli   PAC  06/29/2013, 9:24 AM

## 2013-06-29 NOTE — Progress Notes (Signed)
Pt confused and agitated most of pm until Foley Cath inserted by MD. Awakened around 0200 and "wanted to get up". Bathed pt and placed him up in recliner where he slept off and on until 0600. Back to bed. Bed Alarm on while in bed.

## 2013-06-29 NOTE — Progress Notes (Signed)
Patient ID: William Roach, male   DOB: 14-Apr-1921, 79 y.o.   MRN: [id number] 2 Days Post-Op Subjective: Patient has no new complaints/ issues. Tolerating foley well. Urine draining clear.  Objective: Vital signs in last 24 hours: Temp:  [98.2 F (36.8 C)-98.3 F (36.8 C)] 98.2 F (36.8 C) (09/28 0524) Pulse Rate:  [72-93] 80 (09/28 0524) Resp:  [16-18] 16 (09/28 0524) BP: (108-129)/(56-71) 108/60 mmHg (09/28 0524) SpO2:  [93 %-99 %] 96 % (09/28 0524)  Intake/Output from previous day: 09/27 0701 - 09/28 0700 In: 2520 [P.O.:720; I.V.:1800] Out: 2201 [Urine:2200; Stool:1] Intake/Output this shift:    Physical Exam:  Constitutional: Vital signs reviewed. WD WN in NAD   Eyes: PERRL, No scleral icterus.   Cardiovascular: RRR Pulmonary/Chest: Normal effort Abdominal: Soft. Non-tender, non-distended, bowel sounds are normal, no masses, organomegaly, or guarding present.  Genitourinary:Indwelling foley    Lab Results:  Recent Labs  06/28/13 0541 06/29/13 0540  HGB 11.0* 9.6*  HCT 32.0* 28.2*   BMET  Recent Labs  06/28/13 0541 06/29/13 0540  NA 136 135  K 4.1 3.7  CL 103 102  CO2 26 24  GLUCOSE 115* 121*  BUN 19 22  CREATININE 0.83 0.81  CALCIUM 8.6 8.5   No results found for this basename: LABPT, INR,  in the last 72 hours No results found for this basename: LABURIN,  in the last 72 hours Results for orders placed during the hospital encounter of 06/24/13  SURGICAL PCR SCREEN     Status: None   Collection Time    06/24/13  2:14 PM      Result Value Range Status   MRSA, PCR NEGATIVE  NEGATIVE Final   Staphylococcus aureus NEGATIVE  NEGATIVE Final   Comment:            The Xpert SA Assay (FDA     approved for NASAL specimens     in patients over 77 years of age),     is one component of     a comprehensive surveillance     program.  Test performance has     been validated by [employer] for patients greater     than or equal to 81 year old.     It  is not intended     to diagnose infection nor to     guide or monitor treatment.    Studies/Results: Dg Knee Left Port  06/27/2013   *RADIOLOGY REPORT*  Clinical Data: Status post left total knee replacement.  PORTABLE LEFT KNEE - 1-2 VIEW  Comparison: 05/29/2013.  Findings: The patient is status post left total knee arthroplasty. Subcutaneous and joint air and fluid are noted.  Surgical drain is seen along the lateral aspect of the knee.  Vascular calcifications are incidentally noted.  IMPRESSION: Interval left knee arthroplasty with expected postoperative findings.   Original Report Authenticated By: Leanna Battles, M.D.    Assessment/Plan:   Urinary retention s/p knee replacement. Would send to rehab with foley. Will arrange outpt foley in our office for voiding trial 5-7 days after discharge.   LOS: 2 days   Tymara Saur S 06/29/2013, 8:50 AM

## 2013-06-29 NOTE — Progress Notes (Signed)
Clinical Social Work Department BRIEF PSYCHOSOCIAL ASSESSMENT 06/29/2013  Patient:  William Roach, William Roach     Account Number:  [id number]     Admit date:  06/27/2013  Clinical Social Worker:  Doroteo Glassman  Date/Time:  06/29/2013 04:24 PM  Referred by:  Physician  Date Referred:  06/29/2013 Referred for  SNF Placement   Other Referral:   Interview type:  Patient Other interview type:    PSYCHOSOCIAL DATA Living Status:  ALONE Admitted from facility:   Level of care:   Primary support name:  Lesleigh Noe Primary support relationship to patient:  CHILD, ADULT Degree of support available:   strong    CURRENT CONCERNS Current Concerns  Post-Acute Placement   Other Concerns:    SOCIAL WORK ASSESSMENT / PLAN Met with Pt to discuss d/c plans.    Pt is from [facility] facility and would like to go to rehab at [facility].    CSW to facilitate rehab at [facility].    CSW thanked Pt for his time.   Assessment/plan status:  Psychosocial Support/Ongoing Assessment of Needs Other assessment/ plan:   Information/referral to community resources:   SNF list    PATIENT'S/FAMILY'S RESPONSE TO PLAN OF CARE: Pt understands that he needs rehab and is happy to go to [facility] for therapy.    Pt thanked CSW for time and assistance.   Providence Crosby, LCSWA Clinical Social Work [phone]

## 2013-06-29 NOTE — Progress Notes (Signed)
Clinical Social Work Department CLINICAL SOCIAL WORK PLACEMENT NOTE 06/29/2013  Patient:  William Roach, William Roach  Account Number:  [id number] Admit date:  06/27/2013  Clinical Social Worker:  Doroteo Glassman  Date/time:  06/29/2013 04:27 PM  Clinical Social Work is seeking post-discharge placement for this patient at the following level of care:   SKILLED NURSING   (*CSW will update this form in Epic as items are completed)   06/29/2013  Patient/family provided with [facility] Department of Clinical Social Work's list of facilities offering this level of care within the geographic area requested by the patient (or if unable, by the patient's family).  06/29/2013  Patient/family informed of their freedom to choose among providers that offer the needed level of care, that participate in Medicare, Medicaid or managed care program needed by the patient, have an available bed and are willing to accept the patient.  06/29/2013  Patient/family informed of [facility]' ownership interest in [facility], as well as of the fact that they are under no obligation to receive care at this facility.  PASARR submitted to EDS on 06/29/2013 PASARR number received from EDS on 06/29/2013  FL2 transmitted to all facilities in geographic area requested by pt/family on  06/29/2013 FL2 transmitted to all facilities within larger geographic area on   Patient informed that his/her managed care company has contracts with or will negotiate with  certain facilities, including the following:     Patient/family informed of bed offers received:   Patient chooses bed at  Physician recommends and patient chooses bed at    Patient to be transferred to  on   Patient to be transferred to facility by   The following physician request were entered in Epic:   Additional Comments:  Providence Crosby, Theresia Majors Clinical Social Work [phone]

## 2013-06-30 ENCOUNTER — Encounter (HOSPITAL_COMMUNITY): Payer: Self-pay | Admitting: Orthopedic Surgery

## 2013-06-30 DIAGNOSIS — D62 Acute posthemorrhagic anemia: Secondary | ICD-10-CM | POA: Diagnosis not present

## 2013-06-30 DIAGNOSIS — D500 Iron deficiency anemia secondary to blood loss (chronic): Secondary | ICD-10-CM | POA: Diagnosis not present

## 2013-06-30 HISTORY — DX: Acute posthemorrhagic anemia: D62

## 2013-06-30 LAB — CBC
HCT: 24.9 % — ABNORMAL LOW (ref 39.0–52.0)
MCH: 30.9 pg (ref 26.0–34.0)
MCV: 92.6 fL (ref 78.0–100.0)
Platelets: 92 10*3/uL — ABNORMAL LOW (ref 150–400)
RDW: 13.7 % (ref 11.5–15.5)
WBC: 9.7 10*3/uL (ref 4.0–10.5)

## 2013-06-30 LAB — HEMOGLOBIN AND HEMATOCRIT, BLOOD
HCT: 24.9 % — ABNORMAL LOW (ref 39.0–52.0)
Hemoglobin: 8.6 g/dL — ABNORMAL LOW (ref 13.0–17.0)

## 2013-06-30 MED ORDER — OXYCODONE HCL 5 MG PO TABS
5.0000 mg | ORAL_TABLET | ORAL | Status: DC | PRN
  Administered 2013-07-01: 08:00:00 5 mg via ORAL
  Filled 2013-06-30: qty 1

## 2013-06-30 MED ORDER — OXYCODONE HCL 5 MG PO TABS: 5.0000 mg | ORAL_TABLET | ORAL | Status: DC | PRN

## 2013-06-30 MED ORDER — POLYETHYLENE GLYCOL 3350 17 G PO PACK: 17.0000 g | PACK | Freq: Every day | ORAL | Status: AC | PRN

## 2013-06-30 MED ORDER — RIVAROXABAN 10 MG PO TABS: 10.0000 mg | ORAL_TABLET | Freq: Every day | ORAL | Status: DC

## 2013-06-30 MED ORDER — METHOCARBAMOL 500 MG PO TABS: 500.0000 mg | ORAL_TABLET | Freq: Four times a day (QID) | ORAL | Status: DC | PRN

## 2013-06-30 MED ORDER — BISACODYL 10 MG RE SUPP: 10.0000 mg | Freq: Every day | RECTAL | Status: AC | PRN

## 2013-06-30 MED ORDER — TAMSULOSIN HCL 0.4 MG PO CAPS: 0.4000 mg | ORAL_CAPSULE | Freq: Every day | ORAL | Status: AC

## 2013-06-30 MED ORDER — FERROUS SULFATE 325 (65 FE) MG PO TABS: 325.0000 mg | ORAL_TABLET | Freq: Three times a day (TID) | ORAL | Status: DC

## 2013-06-30 NOTE — Progress Notes (Signed)
Subjective: 3 Days Post-Op Procedure(s) (LRB): LEFT TOTAL KNEE ARTHROPLASTY (Left) Patient reports pain as moderate.   Patient seen in rounds without Dr. Darrelyn Hillock. Patient is doing a little better this morning. He continues to have problems with voiding and therefore the foley was mainatined. He says that he did sleep well last night. He is not having much discomfort at rest, but has a lot of pain when weightbearing on the left LE. No SOB or chest pain. He does not feel ready for discharge yet.  Plan is to go Skilled nursing facility after hospital stay.  Objective: Vital signs in last 24 hours: Temp:  [97.4 F (36.3 C)-98.5 F (36.9 C)] 98.2 F (36.8 C) (09/29 0533) Pulse Rate:  [76-85] 80 (09/29 0533) Resp:  [16-20] 16 (09/29 0533) BP: (109-120)/(40-70) 114/70 mmHg (09/29 0533) SpO2:  [94 %-99 %] 95 % (09/29 0533)  Intake/Output from previous day:  Intake/Output Summary (Last 24 hours) at 06/30/13 0902 Last data filed at 06/30/13 0844  Gross per 24 hour  Intake   3880 ml  Output   2625 ml  Net   1255 ml    Intake/Output this shift: Total I/O In: 240 [P.O.:240] Out: 600 [Urine:600]  Labs:  Recent Labs  06/28/13 0541 06/29/13 0540 06/30/13 0425  HGB 11.0* 9.6* 8.3*    Recent Labs  06/29/13 0540 06/30/13 0425  WBC 12.9* 9.7  RBC 3.03* 2.69*  HCT 28.2* 24.9*  PLT 113* 92*    Recent Labs  06/28/13 0541 06/29/13 0540  NA 136 135  K 4.1 3.7  CL 103 102  CO2 26 24  BUN 19 22  CREATININE 0.83 0.81  GLUCOSE 115* 121*  CALCIUM 8.6 8.5    EXAM General - Patient is Alert and Oriented Extremity - Neurologically intact Neurovascular intact Dorsiflexion/Plantar flexion intact No cellulitis present Compartment soft Dressing/Incision - clean, dry, scant frank blood drainage Motor Function - intact, moving foot and toes well on exam.   Past Medical History  Diagnosis Date  . Coronary artery disease     post CABG x4   . Hypertension   .  Hypercholesterolemia   . Fatigue   . Dizziness   . Basal cell carcinoma, ear     post -- partial pinnectomy left side with primary closure    . Hypothyroidism   . History of kidney stones     x1    Assessment/Plan: 3 Days Post-Op Procedure(s) (LRB): LEFT TOTAL KNEE ARTHROPLASTY (Left) Active Problems:   Osteoarthritis of left knee Acute blood loss anemia  Estimated body mass index is 22.96 kg/(m^2) as calculated from the following:   Height as of this encounter: 5\' 8"  (1.727 m).   Weight as of this encounter: 68.493 kg (151 lb). Advance diet Up with therapy D/C IV fluids Continue foley due to acute urinary retention  DVT Prophylaxis - Xarelto Weight-Bearing as tolerated to left leg  William Roach is progressing as expected. He is still having some issues with voiding and thus we will continue the foley upon discharge. Patient does not feel confident with progress with therapy yet and feels quite dependent. He was not confused during our interaction this morning but has had some issues with confusion during his stay. Discussed discharge plan with the patient and his son and it was decided that we will continue therapy here another day and prepare for discharge to SNF tomorrow. Will recheck labs this afternoon to make sure Hgb is holding steady.     William Roach  [name] 06/30/2013, 9:02 AM

## 2013-06-30 NOTE — Progress Notes (Signed)
Subjective: 3 Days Post-Op Procedure(s) (LRB): LEFT TOTAL KNEE ARTHROPLASTY (Left) Patient reports pain as 3 on 0-10 scale. Calf is non-tender and good dorsiflexion of his foot. Will repeat Hbg this afternoon and in A.M.    Objective: Vital signs in last 24 hours: Temp:  [97.4 F (36.3 C)-98.5 F (36.9 C)] 98.2 F (36.8 C) (09/29 0533) Pulse Rate:  [76-85] 80 (09/29 0533) Resp:  [16-20] 18 (09/29 0800) BP: (109-120)/(40-70) 114/70 mmHg (09/29 0533) SpO2:  [94 %-99 %] 95 % (09/29 0533)  Intake/Output from previous day: 09/28 0701 - 09/29 0700 In: 3760 [P.O.:360; I.V.:3400] Out: 2025 [Urine:2025] Intake/Output this shift: Total I/O In: 860 [P.O.:480; I.V.:380] Out: 600 [Urine:600]   Recent Labs  06/28/13 0541 06/29/13 0540 06/30/13 0425  HGB 11.0* 9.6* 8.3*    Recent Labs  06/29/13 0540 06/30/13 0425  WBC 12.9* 9.7  RBC 3.03* 2.69*  HCT 28.2* 24.9*  PLT 113* 92*    Recent Labs  06/28/13 0541 06/29/13 0540  NA 136 135  K 4.1 3.7  CL 103 102  CO2 26 24  BUN 19 22  CREATININE 0.83 0.81  GLUCOSE 115* 121*  CALCIUM 8.6 8.5   No results found for this basename: LABPT, INR,  in the last 72 hours  Dorsiflexion/Plantar flexion intact Compartment soft  Assessment/Plan: 3 Days Post-Op Procedure(s) (LRB): LEFT TOTAL KNEE ARTHROPLASTY (Left) Up with therapy discontinued to SNF in the morning.  Sadiel Mota A 06/30/2013, 1:15 PM

## 2013-06-30 NOTE — Progress Notes (Signed)
Physical Therapy Treatment Patient Details Name: William Roach MRN: [id number] DOB: 05-Jul-1921 Today's Date: 06/30/2013 Time: 4098-1191 PT Time Calculation (min): 40 min  PT Assessment / Plan / Recommendation  History of Present Illness 79 yo male s/p L TKA 9/26. Hx of CAD, CABG, dizziness.    PT Comments   POD # 3 L TKR am session.  Applied KI and instructed pt/son on use for amb.  Assisted pt OOB to amb in hallway.  Pt required increased VC's for direction and increased time to process.  Performed TKR TE's then applied ice.  Pt plans to D/C to [facility] for ST rehab.   Follow Up Recommendations  SNF ([facility])     Does the patient have the potential to tolerate intense rehabilitation     Barriers to Discharge        Equipment Recommendations  None recommended by PT    Recommendations for Other Services    Frequency 7X/week   Progress towards PT Goals Progress towards PT goals: Progressing toward goals  Plan Current plan remains appropriate    Precautions / Restrictions Precautions Precautions: Knee;Fall Precaution Comments: instructed pt on KI use for amb Required Braces or Orthoses: Knee Immobilizer - Left Knee Immobilizer - Left: Discontinue once straight leg raise with < 10 degree lag Restrictions Weight Bearing Restrictions: No LLE Weight Bearing: Weight bearing as tolerated    Pertinent Vitals/Pain C/o "right much" Muscle Relax requested ICE applied    Mobility  Bed Mobility Bed Mobility: Supine to Sit Supine to Sit: 4: Min assist Details for Bed Mobility Assistance: Assist for trunk and L LE. VCs safety, technique, hand placement plus increased time Transfers Transfers: Sit to Stand;Stand to Sit Sit to Stand: 3: Mod assist;From bed Stand to Sit: 3: Mod assist Details for Transfer Assistance: 75% Vc's on proper tech and hand placement plus increased time Ambulation/Gait Ambulation/Gait Assistance: 4: Min assist Ambulation Distance (Feet): 65  Feet Assistive device: Rolling walker Ambulation/Gait Assistance Details: 50% VC's on upright posture and to increase heel strike L LE.  Poor posture of flex hips and knees.   Gait Pattern: Decreased stride length;Decreased step length - right;Decreased step length - left;Step-to pattern;Step-through pattern;Trunk flexed;Antalgic;Right flexed knee in stance;Left flexed knee in stance Gait velocity: decreased    Exercises   Total Knee Replacement TE's 10 reps B LE ankle pumps 10 reps knee presses 10 reps heel slides  10 reps SAQ's 10 reps SLR's 10 reps ABD Followed by ICE    PT Goals (current goals can now be found in the care plan section)    Visit Information  Last PT Received On: 06/30/13 Assistance Needed: +1 History of Present Illness: 79 yo male s/p L TKA 9/26. Hx of CAD, CABG, dizziness.     Subjective Data      Cognition       Balance     End of Session PT - End of Session Equipment Utilized During Treatment: Gait belt;Left knee immobilizer Activity Tolerance: Patient limited by fatigue;Patient limited by pain Patient left: in chair;with call bell/phone within reach;with family/visitor present   Felecia Shelling  PTA [facility]  Acute  Rehab Pager      [phone]

## 2013-06-30 NOTE — Clinical Documentation Improvement (Signed)
THIS DOCUMENT IS NOT A PERMANENT PART OF THE MEDICAL RECORD  Please update your documentation within the medical record to reflect your response to this query. If you need help knowing how to do this please call [phone].  06/30/13  Dear William Halls, PA-C  [facility]  In a better effort to capture your patient's severity of illness, reflect appropriate length of stay and utilization of resources, a review of the medical record has revealed the following indicators.    Based on your clinical judgment, please clarify and document in a progress note and/or discharge summary the clinical condition associated with the following supporting information:  In responding to this query please exercise your independent judgment.  The fact that a query is asked, does not imply that any particular answer is desired or expected.  Abnormal findings (laboratory, x-ray, pathologic, and other diagnostic results) are not coded and reported unless the physician indicates their clinical significance.   The medical record reflects the following clinical findings, please clarify the diagnostic and/or clinical significance:      Pt w/ post op H/H=8.3/24.9  Clarification Needed    Please clarify the underlying diagnosis responsible for the abnormal post op H/H and document in pn or d/c summary    Possible Clinical Conditions?                                   _____Expected blood loss anemia_____                                        Other Condition___________________                 Cannot Clinically Determine_________     Supporting Information: OA LEFT TOTAL KNEE ARTHROPLASTY (Left Contracture  Diagnostics: Component     Latest Ref Rng 06/28/2013 06/29/2013 06/30/2013  Hemoglobin     13.0 - 17.0 g/dL 09.8 (L) 9.6 (L) 8.3 (L)  HCT     39.0 - 52.0 % 32.0 (L) 28.2 (L) 24.9 (L)    Treatment:  Monitoring ferrous sulfate 325 (65 FE) MG tablet   Reviewed: additional documentation in  the medical record   Thank You,  Enis Slipper  RN, BSN, MSN/Inf, CCDS Clinical Documentation Specialist [facility] Dept Pager: [phone] / E-mail: [email or web address] : [phone] Health Information Management [place]

## 2013-06-30 NOTE — Progress Notes (Signed)
Pt has ST SNF bed at [facility] when stable for d/c. FL2 has been signed. CSW will assist with d/c planning to SNF when stable.   Cori Razor LCSW [phone]

## 2013-06-30 NOTE — Care Management Note (Signed)
    Page 1 of 1   06/30/2013     1:40:20 PM   CARE MANAGEMENT NOTE 06/30/2013  Patient:  William Roach, William Roach   Account Number:  [id number]  Date Initiated:  06/28/2013  Documentation initiated by:  [facility]  Subjective/Objective Assessment:   79 year old male admitted s/p LTKA.     Action/Plan:   SNF at d/c.   Anticipated DC Date:  07/01/2013   Anticipated DC Plan:  SKILLED NURSING FACILITY  In-house referral  Clinical Social Worker      DC Planning Services  CM consult      Choice offered to / List presented to:             Status of service:  Completed, signed off Medicare Important Message given?  NA - LOS <3 / Initial given by admissions (If response is "NO", the following Medicare IM given date fields will be blank) Date Medicare IM given:   Date Additional Medicare IM given:    Discharge Disposition:    Per UR Regulation:  Reviewed for med. necessity/level of care/duration of stay  If discussed at Long Length of Stay Meetings, dates discussed:    Comments:

## 2013-06-30 NOTE — Progress Notes (Signed)
Physical Therapy Treatment Patient Details Name: William Roach MRN: [id number] DOB: 05-Jun-1921 Today's Date: 06/30/2013 Time: 9147-8295 PT Time Calculation (min): 48 min  PT Assessment / Plan / Recommendation  History of Present Illness 79 yo male s/p L TKA 9/26. Hx of CAD, CABG, dizziness.    PT Comments   POD # 3 pm session.  Assisted pt out of recliner to amb in hallway.  Assisted to BR for a BM.  Assisted with hygiene.  Amb to recliner to perform TKR TE's.  Assisted back to bed per pt request. Pt positioned with pillows to increase L knee in extension and instructed on benefits.    Follow Up Recommendations  SNF ([facility])     Does the patient have the potential to tolerate intense rehabilitation     Barriers to Discharge        Equipment Recommendations  None recommended by PT    Recommendations for Other Services    Frequency 7X/week   Progress towards PT Goals Progress towards PT goals: Progressing toward goals  Plan      Precautions / Restrictions Precautions Precautions: Knee;Fall Precaution Comments: instructed pt on KI use for amb Required Braces or Orthoses: Knee Immobilizer - Left Restrictions Weight Bearing Restrictions: No LLE Weight Bearing: Weight bearing as tolerated    Pertinent Vitals/Pain C/o "some" ICE applied    Mobility  Bed Mobility Bed Mobility: Sit to Supine Sit to Supine: 3: Mod assist Details for Bed Mobility Assistance: assisted back to bed with increased time and 50% VC's for proper tech Transfers Transfers: Sit to Stand;Stand to Sit Sit to Stand: 3: Mod assist;From chair/3-in-1;From toilet Stand to Sit: 3: Mod assist;To chair/3-in-1;To toilet;To bed Details for Transfer Assistance: 75% Vc's on proper tech and hand placement plus increased time Ambulation/Gait Ambulation/Gait Assistance: 4: Min assist Ambulation Distance (Feet): 75 Feet Assistive device: Rolling walker Ambulation/Gait Assistance Details: increased time and  50% VC's for direction and 75% VC's on proper walker to self distance.  Gait Pattern: Decreased stride length;Decreased step length - right;Decreased step length - left;Step-to pattern;Step-through pattern;Trunk flexed;Antalgic;Right flexed knee in stance;Left flexed knee in stance Gait velocity: decreased    Exercises   Total Knee Replacement TE's 10 reps B LE ankle pumps 10 reps knee presses 10 reps heel slides  10 reps SAQ's 10 reps SLR's 10 reps ABD Followed by ICE   PT Goals (current goals can now be found in the care plan section)    Visit Information  Last PT Received On: 06/30/13 Assistance Needed: +1 History of Present Illness: 79 yo male s/p L TKA 9/26. Hx of CAD, CABG, dizziness.     Subjective Data      Cognition       Balance     End of Session PT - End of Session Equipment Utilized During Treatment: Gait belt;Left knee immobilizer Activity Tolerance: Patient limited by fatigue;Patient limited by pain Patient left: in bed;with call bell/phone within reach;with family/visitor present   Felecia Shelling  PTA [facility]  Acute  Rehab Pager      [phone]

## 2013-07-01 ENCOUNTER — Encounter: Payer: Self-pay | Admitting: Nurse Practitioner

## 2013-07-01 ENCOUNTER — Non-Acute Institutional Stay (SKILLED_NURSING_FACILITY): Payer: Medicare Other | Admitting: Nurse Practitioner

## 2013-07-01 DIAGNOSIS — Z96652 Presence of left artificial knee joint: Secondary | ICD-10-CM

## 2013-07-01 DIAGNOSIS — Z96659 Presence of unspecified artificial knee joint: Secondary | ICD-10-CM | POA: Insufficient documentation

## 2013-07-01 DIAGNOSIS — E039 Hypothyroidism, unspecified: Secondary | ICD-10-CM

## 2013-07-01 DIAGNOSIS — K5900 Constipation, unspecified: Secondary | ICD-10-CM

## 2013-07-01 DIAGNOSIS — M1712 Unilateral primary osteoarthritis, left knee: Secondary | ICD-10-CM

## 2013-07-01 DIAGNOSIS — D62 Acute posthemorrhagic anemia: Secondary | ICD-10-CM

## 2013-07-01 DIAGNOSIS — N400 Enlarged prostate without lower urinary tract symptoms: Secondary | ICD-10-CM | POA: Insufficient documentation

## 2013-07-01 DIAGNOSIS — M1710 Unilateral primary osteoarthritis, unspecified knee: Secondary | ICD-10-CM

## 2013-07-01 DIAGNOSIS — I2510 Atherosclerotic heart disease of native coronary artery without angina pectoris: Secondary | ICD-10-CM

## 2013-07-01 DIAGNOSIS — I10 Essential (primary) hypertension: Secondary | ICD-10-CM

## 2013-07-01 HISTORY — DX: Presence of unspecified artificial knee joint: Z96.659

## 2013-07-01 HISTORY — DX: Hypothyroidism, unspecified: E03.9

## 2013-07-01 HISTORY — DX: Constipation, unspecified: K59.00

## 2013-07-01 LAB — CBC WITH DIFFERENTIAL/PLATELET
HCT: 23.5 % — ABNORMAL LOW (ref 39.0–52.0)
Hemoglobin: 8.1 g/dL — ABNORMAL LOW (ref 13.0–17.0)
Lymphocytes Relative: 8 % — ABNORMAL LOW (ref 12–46)
Lymphs Abs: 0.7 10*3/uL (ref 0.7–4.0)
MCHC: 34.5 g/dL (ref 30.0–36.0)
Monocytes Absolute: 1.2 10*3/uL — ABNORMAL HIGH (ref 0.1–1.0)
Monocytes Relative: 13 % — ABNORMAL HIGH (ref 3–12)
Neutro Abs: 7 10*3/uL (ref 1.7–7.7)
Neutrophils Relative %: 75 % (ref 43–77)
RBC: 2.54 MIL/uL — ABNORMAL LOW (ref 4.22–5.81)
WBC: 9.3 10*3/uL (ref 4.0–10.5)

## 2013-07-01 NOTE — Assessment & Plan Note (Signed)
Takes Levothyroxine , update TSH and CMP

## 2013-07-01 NOTE — Assessment & Plan Note (Signed)
Takes Tamsulosin    

## 2013-07-01 NOTE — Assessment & Plan Note (Signed)
Managed with MiraLax dailly and prn Bisacodyl suppository prn.     

## 2013-07-01 NOTE — Assessment & Plan Note (Signed)
Continue Fe, update CBC next week.

## 2013-07-01 NOTE — Progress Notes (Signed)
Subjective: 4 Days Post-Op Procedure(s) (LRB): LEFT TOTAL KNEE ARTHROPLASTY (Left) Patient reports pain as 2 on 0-10 scale.Dressing changed and wound looks fine. Hbg is stable at 8.1.It has been stable for three consecutive times . He has been stable also. Plan on Dc Today to [facility].   Objective: Vital signs in last 24 hours: Temp:  [97.3 F (36.3 C)-98.4 F (36.9 C)] 98.4 F (36.9 C) (09/30 1610) Pulse Rate:  [73-89] 76 (09/30 0633) Resp:  [16-18] 16 (09/30 9604) BP: (109-121)/(64-71) 109/64 mmHg (09/30 0633) SpO2:  [96 %-98 %] 96 % (09/30 5409)  Intake/Output from previous day: 09/29 0701 - 09/30 0700 In: 1340 [P.O.:960; I.V.:380] Out: 1851 [Urine:1850; Stool:1] Intake/Output this shift: Total I/O In: 240 [P.O.:240] Out: 500 [Urine:500]   Recent Labs  06/29/13 0540 06/30/13 0425 06/30/13 1515 07/01/13 0428  HGB 9.6* 8.3* 8.6* 8.1*    Recent Labs  06/30/13 0425 06/30/13 1515 07/01/13 0428  WBC 9.7  --  9.3  RBC 2.69*  --  2.54*  HCT 24.9* 24.9* 23.5*  PLT 92*  --  117*    Recent Labs  06/29/13 0540  NA 135  K 3.7  CL 102  CO2 24  BUN 22  CREATININE 0.81  GLUCOSE 121*  CALCIUM 8.5   No results found for this basename: LABPT, INR,  in the last 72 hours  Dorsiflexion/Plantar flexion intact No cellulitis present  Assessment/Plan: 4 Days Post-Op Procedure(s) (LRB): LEFT TOTAL KNEE ARTHROPLASTY (Left) Discharge to SNF  Leigha Olberding A 07/01/2013, 6:56 AM

## 2013-07-01 NOTE — Assessment & Plan Note (Signed)
S/p TKR-incision intact with mild erythema and serosanguinous drainage seen on 4x4 gauze--will apply dry dressing daily and prn. WBAT. Oxycodone 5mg 1-2 q3 hrs prn available to him. Here for PT/OT and goal is to return to [place] @ [facility] when able.    

## 2013-07-01 NOTE — Assessment & Plan Note (Signed)
S/p CABG >20 years. No c/o angina or chest pressures.    

## 2013-07-01 NOTE — Progress Notes (Signed)
Patient ID: William Roach, male   DOB: [birth year], 79 y.o.   MRN: [id number]  No Known Allergies  Chief Complaint  Patient presents with  . Medical Managment of Chronic Issues    s/p left TKR  . Hospitalization Follow-up    HPI: Patient is a 79 y.o. male seen in the SNF at [facility] today for evaluation of s/p left TKR  and other chronic medical conditions. Hospitalized 06/27/13-07/01/13 for total left knee arthroplasty. The patient has a history of pain and functional disability in the left knee due to arthritis and has failed non-surgical conservative treatments for greater than 12 weeks to include NSAID's and/or analgesics, corticosteriod injections. Onset of symptoms was gradual, starting >10 years ago with gradually worsening course since that time.   Problem List Items Addressed This Visit   BPH (benign prostatic hyperplasia)     Takes Tamsulosin     CAD (coronary artery disease)     S/p CABG >20 years. No c/o angina or chest pressures.     HTN (hypertension)     Controlled.     Osteoarthritis of left knee     S/p TKR-incision intact with mild erythema and serosanguinous drainage seen on 4x4 gauze--will apply dry dressing daily and prn. WBAT. Oxycodone 5mg  1-2 q3 hrs prn available to him. Here for PT/OT and goal [place] to return [place] @ [facility] when able.     Postoperative anemia due to acute blood loss - Primary     Continue Fe, update CBC next week.     S/P knee replacement     Xarelto post op thromboembolic risk reduction. Here for Rehab-goal [place] to return [place] @ [facility]    Unspecified constipation     Managed with MiraLax dailly and prn Bisacodyl suppository prn.      Unspecified hypothyroidism     Takes Levothyroxine , update TSH and CMP       Review of Systems:  Review of Systems  Constitutional: Positive for malaise/fatigue. Negative for fever, chills, weight loss and diaphoresis.  HENT: Negative for hearing loss, ear pain, nosebleeds, congestion, sore throat,  neck pain, tinnitus and ear discharge.   Eyes: Negative for blurred vision, double vision, photophobia, pain, discharge and redness.  Respiratory: Negative for cough, hemoptysis, sputum production, shortness of breath, wheezing and stridor.   Cardiovascular: Positive for leg swelling. Negative for chest pain, palpitations, orthopnea, claudication and PND.       Mild RLE  Gastrointestinal: Negative for heartburn, nausea, vomiting, abdominal pain, diarrhea, constipation, blood in stool and melena.  Genitourinary: Negative for dysuria, urgency, frequency, hematuria and flank pain.       Foley with clear urine collection-remove in am  Musculoskeletal: Positive for joint pain. Negative for myalgias, back pain and falls.       Left knee  Skin: Negative for itching and rash.       The right knee surgical incision is intact with mild erythema and small amount serosanguinous drainage.   Neurological: Positive for weakness. Negative for dizziness, tingling, tremors, sensory change, speech change, focal weakness, seizures, loss of consciousness and headaches.  Endo/Heme/Allergies: Negative for environmental allergies and polydipsia. Does not bruise/bleed easily.  Psychiatric/Behavioral: Negative for depression, suicidal ideas, hallucinations, memory loss and substance abuse. The patient is not nervous/anxious and does not have insomnia.      Past Medical History  Diagnosis Date  . Coronary artery disease     post CABG x4   . Hypertension   .  Hypercholesterolemia   . Fatigue   . Dizziness   . Basal cell carcinoma, ear     post -- partial pinnectomy left side with primary closure    . Hypothyroidism   . History of kidney stones     x1   Past Surgical History  Procedure Laterality Date  . Coronary artery bypass graft  2003    x4 --   . Cholecystectomy  2002  . Inguinal hernia repair  09/19/2007  . Ear cyst excision  12/14/2009    Excision of left auricular cyst with primary closure  . Basal  cell carcinoma excision  01/14/2010    Wedge resection, partial pinnectomy left side with primary closure      . Tonsillectomy    . Carpal tunnel release    . Cataract surgery Bilateral 06-24-13  . Tonsillectomy    . Cystoscopy    . Total knee arthroplasty Left 06/27/2013    Procedure: LEFT TOTAL KNEE ARTHROPLASTY;  Surgeon: Jacki Cones, MD;  Location: [facility];  Service: Orthopedics;  Laterality: Left;   Social History:   reports that he quit smoking about 32 years ago. He does not have any smokeless tobacco history on file. He reports that he does not drink alcohol or use illicit drugs.  Family History  Problem Relation Age of Onset  . Parkinsonism Father   . Angina Mother   . Coronary artery disease Brother     with CABG    Medications: Patient's Medications  New Prescriptions   No medications on file  Previous Medications   ACETAMINOPHEN (TYLENOL) 500 MG TABLET    Take 1,000 mg by mouth daily.   BISACODYL (DULCOLAX) 10 MG SUPPOSITORY    Place 1 suppository (10 mg total) rectally daily as needed.   FERROUS SULFATE 325 (65 FE) MG TABLET    Take 1 tablet (325 mg total) by mouth 3 (three) times daily after meals.   LEVOTHYROXINE (SYNTHROID, LEVOTHROID) 50 MCG TABLET    Take 50 mcg by mouth daily before breakfast.    METHOCARBAMOL (ROBAXIN) 500 MG TABLET    Take 1 tablet (500 mg total) by mouth every 6 (six) hours as needed.   OXYCODONE (OXY IR/ROXICODONE) 5 MG IMMEDIATE RELEASE TABLET    Take 1-2 tablets (5-10 mg total) by mouth every 3 (three) hours as needed.   POLYETHYLENE GLYCOL (MIRALAX / GLYCOLAX) PACKET    Take 17 g by mouth daily as needed.   RIVAROXABAN (XARELTO) 10 MG TABS TABLET    Take 1 tablet (10 mg total) by mouth daily with breakfast.   TAMSULOSIN (FLOMAX) 0.4 MG CAPS CAPSULE    Take 1 capsule (0.4 mg total) by mouth daily.  Modified Medications   No medications on file  Discontinued Medications   No medications on file     Physical Exam: Physical Exam   Constitutional: He is oriented to person, place, and time. He appears well-developed and well-nourished. No distress.  HENT:  Head: Normocephalic and atraumatic.  Right Ear: External ear normal.  Left Ear: External ear normal.  Nose: Nose normal.  Mouth/Throat: Oropharynx is clear and moist. No oropharyngeal exudate.  Eyes: Conjunctivae and EOM are normal. Pupils are equal, round, and reactive to light. Right eye exhibits no discharge. Left eye exhibits no discharge. No scleral icterus.  Neck: Normal range of motion. Neck supple. No JVD present. No tracheal deviation present. No thyromegaly present.  Cardiovascular: Normal rate, regular rhythm, normal heart sounds and intact distal pulses.  No murmur heard. Pulmonary/Chest: Effort normal and breath sounds normal. No stridor. No respiratory distress. He has no wheezes. He has no rales. He exhibits no tenderness.  Abdominal: Soft. Bowel sounds are normal. He exhibits no distension. There is no tenderness. There is no rebound and no guarding.  Genitourinary: Penis normal.  Musculoskeletal: Normal range of motion. He exhibits edema and tenderness.  The right knee surgical incision intact with mild erythema and small amount of serosanguinous drainage. Pain with limited PROM noted. Able to wiggle his toes. The chronic tingling and numbness in BLE are no change.    Lymphadenopathy:    He has no cervical adenopathy.  Neurological: He is alert and oriented to person, place, and time. He has normal reflexes. He displays normal reflexes. No cranial nerve deficit. He exhibits normal muscle tone. Coordination normal.  Skin: Skin is warm and dry. No rash noted. He is not diaphoretic. There is erythema. No pallor.  The right knee surgical incision is intact  Psychiatric: He has a normal mood and affect. His behavior is normal. Judgment and thought content normal.    Filed Vitals:   07/01/13 1648  BP: 110/63  Pulse: 83  Temp: 98.9 F (37.2 C)   TempSrc: Tympanic  Resp: 20      Labs reviewed: Basic Metabolic Panel:  Recent Labs  16/10/96 1430 06/28/13 0541 06/29/13 0540  NA 136 136 135  K 4.9 4.1 3.7  CL 101 103 102  CO2 26 26 24   GLUCOSE 97 115* 121*  BUN 31* 19 22  CREATININE 0.98 0.83 0.81  CALCIUM 9.7 8.6 8.5   Liver Function Tests:  Recent Labs  04/09/13 1030 05/14/13 0927 06/24/13 1430  AST 83* 33 30  ALT 137* 40 22  ALKPHOS 76 71 87  BILITOT 1.0 1.1 0.6  PROT 6.6 6.3 6.7  ALBUMIN 4.1 3.9 3.9   CBC:  Recent Labs  06/29/13 0540 06/30/13 0425 06/30/13 1515 07/01/13 0428  WBC 12.9* 9.7  --  9.3  NEUTROABS  --   --   --  7.0  HGB 9.6* 8.3* 8.6* 8.1*  HCT 28.2* 24.9* 24.9* 23.5*  MCV 93.1 92.6  --  92.5  PLT 113* 92*  --  117*   Lipid Panel:  Recent Labs  04/09/13 1030  CHOL 138  HDL 40.10  LDLCALC 69  TRIG 144.0  CHOLHDL 3    Past Procedures:  06/24/2013 History of CABG in 2003. Hypertension. EXAM: CHEST 2 VIEW COMPARISON: 01/14/2010 FINDINGS: The patient has had median sternotomy. IMPRESSION: No active cardiopulmonary disease.   06/27/2013 *RADIOLOGY REPORT* Clinical Data: Status post left total knee replacement. PORTABLE LEFT KNEE - 1-2 VIEW Comparison: 05/29/2013. Findings: The patient is status post left total knee arthroplasty. IMPRESSION: Interval left knee arthroplasty with expected postoperative findings.   Assessment/Plan Postoperative anemia due to acute blood loss Continue Fe, update CBC next week.   Osteoarthritis of left knee S/p TKR-incision intact with mild erythema and serosanguinous drainage seen on 4x4 gauze--will apply dry dressing daily and prn. WBAT. Oxycodone 5mg  1-2 q3 hrs prn available to him. Here for PT/OT and goal [place] [facility] when able[place]   HTN (hypertension) Controlled.   CAD (coronary artery disease) S/p CABG >20 years. No c/o angina or chest pressures.   S/P knee replacement Xarelto post op thromboembolic risk reduction. Here for  Rehab-goal [place] to return [place] [facility]  BPH (benign prostatic hyperplasia) Takes Tamsulosin   Unspecified constipation Managed  with MiraLax dailly and prn Bisacodyl suppository prn.    Unspecified hypothyroidism Takes Levothyroxine , update TSH and CMP    Family/ Staff Communication: observe the patient.   Goals of Care: [place] Here for rehab s/p left TKR  Labs/tests ordered: CBC, CMP, TSH

## 2013-07-01 NOTE — Discharge Summary (Signed)
Physician Discharge Summary   Patient ID: William OROURKE MRN: [id number] DOB/AGE: 04/05/1921 79 y.o.  Admit date: 06/27/2013 Discharge date: 07/01/2013  Primary Diagnosis: Osteoarthritis, left knee   Admission Diagnoses:  Past Medical History  Diagnosis Date  . Coronary artery disease     post CABG x4   . Hypertension   . Hypercholesterolemia   . Fatigue   . Dizziness   . Basal cell carcinoma, ear     post -- partial pinnectomy left side with primary closure    . Hypothyroidism   . History of kidney stones     x1   Discharge Diagnoses:   Active Problems:   Osteoarthritis of left knee   Postoperative anemia due to acute blood loss   Expected blood loss anemia   Acute blood loss anemia  Estimated body mass index is 22.96 kg/(m^2) as calculated from the following:   Height as of this encounter: 5\' 8"  (1.727 m).   Weight as of this encounter: 68.493 kg (151 lb).  Procedure:  Procedure(s) (LRB): LEFT TOTAL KNEE ARTHROPLASTY (Left)   Consults: urology  HPI: William Roach, 79 y.o. male, has a history of pain and functional disability in the left knee due to arthritis and has failed non-surgical conservative treatments for greater than 12 weeks to includeNSAID's and/or analgesics, corticosteriod injections, use of assistive devices and activity modification. Onset of symptoms was gradual, starting >10 years ago with gradually worsening course since that time. The patient noted no past surgery on the left knee(s). Patient currently rates pain in the left knee(s) at 7 out of 10 with activity. Patient has night pain, worsening of pain with activity and weight bearing, pain that interferes with activities of daily living, pain with passive range of motion, crepitus and joint swelling. Patient has evidence of periarticular osteophytes and joint space narrowing by imaging studies. There is no active infection.   Laboratory Data: Admission on 06/27/2013  Component Date Value Range  Status  . ABO/RH(D) 06/27/2013 O POS   Final  . Antibody Screen 06/27/2013 NEG   Final  . Sample Expiration 06/27/2013 06/30/2013   Final  . WBC 06/28/2013 15.2* 4.0 - 10.5 K/uL Final  . RBC 06/28/2013 3.41* 4.22 - 5.81 MIL/uL Final  . Hemoglobin 06/28/2013 11.0* 13.0 - 17.0 g/dL Final  . HCT 78/29/5621 32.0* 39.0 - 52.0 % Final  . MCV 06/28/2013 93.8  78.0 - 100.0 fL Final  . MCH 06/28/2013 32.3  26.0 - 34.0 pg Final  . MCHC 06/28/2013 34.4  30.0 - 36.0 g/dL Final  . RDW 30/86/5784 13.3  11.5 - 15.5 % Final  . Platelets 06/28/2013 138* 150 - 400 K/uL Final  . Sodium 06/28/2013 136  135 - 145 mEq/L Final  . Potassium 06/28/2013 4.1  3.5 - 5.1 mEq/L Final  . Chloride 06/28/2013 103  96 - 112 mEq/L Final  . CO2 06/28/2013 26  19 - 32 mEq/L Final  . Glucose, Bld 06/28/2013 115* 70 - 99 mg/dL Final  . BUN 69/62/9528 19  6 - 23 mg/dL Final  . Creatinine, Ser 06/28/2013 0.83  0.50 - 1.35 mg/dL Final  . Calcium 41/32/4401 8.6  8.4 - 10.5 mg/dL Final  . GFR calc non Af Amer 06/28/2013 75* >90 mL/min Final  . GFR calc Af Amer 06/28/2013 87* >90 mL/min Final   Comment: (NOTE)  The eGFR has been calculated using the CKD EPI equation.                          This calculation has not been validated in all clinical situations.                          eGFR's persistently <90 mL/min signify possible Chronic Kidney                          Disease.  . WBC 06/29/2013 12.9* 4.0 - 10.5 K/uL Final  . RBC 06/29/2013 3.03* 4.22 - 5.81 MIL/uL Final  . Hemoglobin 06/29/2013 9.6* 13.0 - 17.0 g/dL Final  . HCT 16/07/9603 28.2* 39.0 - 52.0 % Final  . MCV 06/29/2013 93.1  78.0 - 100.0 fL Final  . MCH 06/29/2013 31.7  26.0 - 34.0 pg Final  . MCHC 06/29/2013 34.0  30.0 - 36.0 g/dL Final  . RDW 54/06/8118 13.4  11.5 - 15.5 % Final  . Platelets 06/29/2013 113* 150 - 400 K/uL Final   Comment: REPEATED TO VERIFY                          SPECIMEN CHECKED FOR CLOTS                           PLATELET COUNT CONFIRMED BY SMEAR  . Sodium 06/29/2013 135  135 - 145 mEq/L Final  . Potassium 06/29/2013 3.7  3.5 - 5.1 mEq/L Final  . Chloride 06/29/2013 102  96 - 112 mEq/L Final  . CO2 06/29/2013 24  19 - 32 mEq/L Final  . Glucose, Bld 06/29/2013 121* 70 - 99 mg/dL Final  . BUN 14/78/2956 22  6 - 23 mg/dL Final  . Creatinine, Ser 06/29/2013 0.81  0.50 - 1.35 mg/dL Final  . Calcium 21/30/8657 8.5  8.4 - 10.5 mg/dL Final  . GFR calc non Af Amer 06/29/2013 76* >90 mL/min Final  . GFR calc Af Amer 06/29/2013 88* >90 mL/min Final   Comment: (NOTE)                          The eGFR has been calculated using the CKD EPI equation.                          This calculation has not been validated in all clinical situations.                          eGFR's persistently <90 mL/min signify possible Chronic Kidney                          Disease.  . WBC 06/30/2013 9.7  4.0 - 10.5 K/uL Final  . RBC 06/30/2013 2.69* 4.22 - 5.81 MIL/uL Final  . Hemoglobin 06/30/2013 8.3* 13.0 - 17.0 g/dL Final  . HCT 84/69/6295 24.9* 39.0 - 52.0 % Final  . MCV 06/30/2013 92.6  78.0 - 100.0 fL Final  . MCH 06/30/2013 30.9  26.0 - 34.0 pg Final  . MCHC 06/30/2013 33.3  30.0 - 36.0 g/dL Final  . RDW 28/41/3244 13.7  11.5 - 15.5 % Final  . Platelets 06/30/2013 92* 150 -  400 K/uL Final   CONSISTENT WITH PREVIOUS RESULT  . Hemoglobin 06/30/2013 8.6* 13.0 - 17.0 g/dL Final  . HCT 19/14/7829 24.9* 39.0 - 52.0 % Final  . WBC 07/01/2013 9.3  4.0 - 10.5 K/uL Final  . RBC 07/01/2013 2.54* 4.22 - 5.81 MIL/uL Final  . Hemoglobin 07/01/2013 8.1* 13.0 - 17.0 g/dL Final  . HCT 56/21/3086 23.5* 39.0 - 52.0 % Final  . MCV 07/01/2013 92.5  78.0 - 100.0 fL Final  . MCH 07/01/2013 31.9  26.0 - 34.0 pg Final  . MCHC 07/01/2013 34.5  30.0 - 36.0 g/dL Final  . RDW 57/84/6962 13.5  11.5 - 15.5 % Final  . Platelets 07/01/2013 117* 150 - 400 K/uL Final   CONSISTENT WITH PREVIOUS RESULT  . Neutrophils Relative % 07/01/2013 75  43 - 77  % Final  . Neutro Abs 07/01/2013 7.0  1.7 - 7.7 K/uL Final  . Lymphocytes Relative 07/01/2013 8* 12 - 46 % Final  . Lymphs Abs 07/01/2013 0.7  0.7 - 4.0 K/uL Final  . Monocytes Relative 07/01/2013 13* 3 - 12 % Final  . Monocytes Absolute 07/01/2013 1.2* 0.1 - 1.0 K/uL Final  . Eosinophils Relative 07/01/2013 4  0 - 5 % Final  . Eosinophils Absolute 07/01/2013 0.3  0.0 - 0.7 K/uL Final  . Basophils Relative 07/01/2013 0  0 - 1 % Final  . Basophils Absolute 07/01/2013 0.0  0.0 - 0.1 K/uL Final  Hospital Outpatient Visit on 06/24/2013  Component Date Value Range Status  . ABO/RH(D) 06/24/2013 O POS   Final  Hospital Outpatient Visit on 06/24/2013  Component Date Value Range Status  . MRSA, PCR 06/24/2013 NEGATIVE  NEGATIVE Final  . Staphylococcus aureus 06/24/2013 NEGATIVE  NEGATIVE Final   Comment:                                 The Xpert SA Assay (FDA                          approved for NASAL specimens                          in patients over 6 years of age),                          is one component of                          a comprehensive surveillance                          program.  Test performance has                          been validated by [employer] for patients greater                          than or equal to 61 year old.  It is not intended                          to diagnose infection nor to                          guide or monitor treatment.  . WBC 06/24/2013 7.4  4.0 - 10.5 K/uL Final  . RBC 06/24/2013 4.50  4.22 - 5.81 MIL/uL Final  . Hemoglobin 06/24/2013 14.3  13.0 - 17.0 g/dL Final  . HCT 14/78/2956 42.2  39.0 - 52.0 % Final  . MCV 06/24/2013 93.8  78.0 - 100.0 fL Final  . MCH 06/24/2013 31.8  26.0 - 34.0 pg Final  . MCHC 06/24/2013 33.9  30.0 - 36.0 g/dL Final  . RDW 21/30/8657 13.4  11.5 - 15.5 % Final  . Platelets 06/24/2013 177  150 - 400 K/uL Final  . aPTT 06/24/2013 27  24 - 37 seconds Final    . Sodium 06/24/2013 136  135 - 145 mEq/L Final  . Potassium 06/24/2013 4.9  3.5 - 5.1 mEq/L Final  . Chloride 06/24/2013 101  96 - 112 mEq/L Final  . CO2 06/24/2013 26  19 - 32 mEq/L Final  . Glucose, Bld 06/24/2013 97  70 - 99 mg/dL Final  . BUN 84/69/6295 31* 6 - 23 mg/dL Final  . Creatinine, Ser 06/24/2013 0.98  0.50 - 1.35 mg/dL Final  . Calcium 28/41/3244 9.7  8.4 - 10.5 mg/dL Final  . Total Protein 06/24/2013 6.7  6.0 - 8.3 g/dL Final  . Albumin 10/04/7251 3.9  3.5 - 5.2 g/dL Final  . AST 66/44/0347 30  0 - 37 U/L Final  . ALT 06/24/2013 22  0 - 53 U/L Final  . Alkaline Phosphatase 06/24/2013 87  39 - 117 U/L Final  . Total Bilirubin 06/24/2013 0.6  0.3 - 1.2 mg/dL Final  . GFR calc non Af Amer 06/24/2013 70* >90 mL/min Final  . GFR calc Af Amer 06/24/2013 81* >90 mL/min Final   Comment: (NOTE)                          The eGFR has been calculated using the CKD EPI equation.                          This calculation has not been validated in all clinical situations.                          eGFR's persistently <90 mL/min signify possible Chronic Kidney                          Disease.  Marland Kitchen Prothrombin Time 06/24/2013 12.6  11.6 - 15.2 seconds Final  . INR 06/24/2013 0.96  0.00 - 1.49 Final  . ABO/RH(D) 06/24/2013 O POS   Final  . Antibody Screen 06/24/2013 NEG   Final  . Sample Expiration 06/24/2013 06/30/2013   Final  . Color, Urine 06/24/2013 YELLOW  YELLOW Final  . APPearance 06/24/2013 CLOUDY* CLEAR Final  . Specific Gravity, Urine 06/24/2013 1.020  1.005 - 1.030 Final  . pH 06/24/2013 6.5  5.0 - 8.0 Final  . Glucose, UA 06/24/2013 NEGATIVE  NEGATIVE mg/dL Final  . Hgb urine dipstick 06/24/2013 NEGATIVE  NEGATIVE Final  . Bilirubin Urine  06/24/2013 NEGATIVE  NEGATIVE Final  . Ketones, ur 06/24/2013 NEGATIVE  NEGATIVE mg/dL Final  . Protein, ur 16/07/9603 NEGATIVE  NEGATIVE mg/dL Final  . Urobilinogen, UA 06/24/2013 0.2  0.0 - 1.0 mg/dL Final  . Nitrite 54/06/8118  NEGATIVE  NEGATIVE Final  . Leukocytes, UA 06/24/2013 NEGATIVE  NEGATIVE Final   MICROSCOPIC NOT DONE ON URINES WITH NEGATIVE PROTEIN, BLOOD, LEUKOCYTES, NITRITE, OR GLUCOSE <1000 mg/dL.  Appointment on 05/14/2013  Component Date Value Range Status  . Total Bilirubin 05/14/2013 1.1  0.3 - 1.2 mg/dL Final  . Bilirubin, Direct 05/14/2013 0.2  0.0 - 0.3 mg/dL Final  . Alkaline Phosphatase 05/14/2013 71  39 - 117 U/L Final  . AST 05/14/2013 33  0 - 37 U/L Final  . ALT 05/14/2013 40  0 - 53 U/L Final  . Total Protein 05/14/2013 6.3  6.0 - 8.3 g/dL Final  . Albumin 14/78/2956 3.9  3.5 - 5.2 g/dL Final     X-Rays:Dg Chest 2 View  06/24/2013   CLINICAL DATA:  Preop. History of CABG in 2003. Hypertension.  EXAM: CHEST  2 VIEW  COMPARISON:  01/14/2010  FINDINGS: The patient has had median sternotomy. Heart size is normal. Lungs are clear. No focal consolidations, pleural effusions, or pulmonary edema. Degenerative changes are seen in the spine. Surgical clips are present in the right upper quadrant of the abdomen.  IMPRESSION: No active cardiopulmonary disease.   Electronically Signed   By: Rosalie Gums M.D.   On: 06/24/2013 15:05   Dg Knee Left Port  06/27/2013   *RADIOLOGY REPORT*  Clinical Data: Status post left total knee replacement.  PORTABLE LEFT KNEE - 1-2 VIEW  Comparison: 05/29/2013.  Findings: The patient is status post left total knee arthroplasty. Subcutaneous and joint air and fluid are noted.  Surgical drain is seen along the lateral aspect of the knee.  Vascular calcifications are incidentally noted.  IMPRESSION: Interval left knee arthroplasty with expected postoperative findings.   Original Report Authenticated By: Leanna Battles, M.D.    EKG: Orders placed in visit on 10/08/12  . EKG 12-LEAD     Hospital Course: William Roach is a 79 y.o. who was admitted to [facility]. They were brought to the operating room on 06/27/2013 and underwent Procedure(s): LEFT TOTAL KNEE  ARTHROPLASTY.  Patient tolerated the procedure well and was later transferred to the recovery room and then to the orthopaedic floor for postoperative care.  They were given PO and IV analgesics for pain control following their surgery.  They were given 24 hours of postoperative antibiotics of  Anti-infectives   Start     Dose/Rate Route Frequency Ordered Stop   06/27/13 2000  ceFAZolin (ANCEF) IVPB 1 g/50 mL premix     1 g 100 mL/hr over 30 Minutes Intravenous Every 6 hours 06/27/13 1722 06/28/13 0214   06/27/13 1414  polymyxin B 500,000 Units, bacitracin 50,000 Units in sodium chloride irrigation 0.9 % 500 mL irrigation  Status:  Discontinued       As needed 06/27/13 1414 06/27/13 1602   06/27/13 1006  ceFAZolin (ANCEF) IVPB 2 g/50 mL premix     2 g 100 mL/hr over 30 Minutes Intravenous On call to O.R. 06/27/13 1006 06/27/13 1329     and started on DVT prophylaxis in the form of Xarelto.   PT and OT were ordered for total joint protocol.  Discharge planning consulted to help with postop disposition and equipment needs.  Patient had a fair  night on the evening of surgery.  They started to get up OOB with therapy on day one. Hemovac drain was pulled without difficulty. Patient developed urinary retention post op. Urology recommended continuing the foley in addition to planning a voiding trial in their office in a week.  Continued to work with therapy into day two. Dressing was changed on day three with scant bloody drainage. No signs of infection. Incision was healing well. Patient was closely monitored day three due to acute blood loss anemia.  Patient was seen in rounds post op day four and was ready to go home as Hgb was holding steady and patient remained asymptomatic.   Discharge Medications: Prior to Admission medications   Medication Sig Start Date End Date Taking? Authorizing Provider  acetaminophen (TYLENOL) 500 MG tablet Take 1,000 mg by mouth daily.   Yes Historical Provider, MD    levothyroxine (SYNTHROID, LEVOTHROID) 50 MCG tablet Take 50 mcg by mouth daily before breakfast.  12/01/11  Yes Historical Provider, MD  bisacodyl (DULCOLAX) 10 MG suppository Place 1 suppository (10 mg total) rectally daily as needed. 06/30/13   Darreon Lutes Tamala Ser, PA-C  ferrous sulfate 325 (65 FE) MG tablet Take 1 tablet (325 mg total) by mouth 3 (three) times daily after meals. 06/30/13   Yoskar Murrillo Tamala Ser, PA-C  methocarbamol (ROBAXIN) 500 MG tablet Take 1 tablet (500 mg total) by mouth every 6 (six) hours as needed. 06/30/13   Taquilla Downum Tamala Ser, PA-C  oxyCODONE (OXY IR/ROXICODONE) 5 MG immediate release tablet Take 1-2 tablets (5-10 mg total) by mouth every 3 (three) hours as needed. 06/30/13   Znya Albino Tamala Ser, PA-C  polyethylene glycol (MIRALAX / GLYCOLAX) packet Take 17 g by mouth daily as needed. 06/30/13   Jaylene Schrom Tamala Ser, PA-C  rivaroxaban (XARELTO) 10 MG TABS tablet Take 1 tablet (10 mg total) by mouth daily with breakfast. 06/30/13   Keevon Henney Tamala Ser, PA-C  tamsulosin (FLOMAX) 0.4 MG CAPS capsule Take 1 capsule (0.4 mg total) by mouth daily. 06/30/13   Dacoda Finlay Tamala Ser, PA-C    Diet: Cardiac diet Activity:WBAT Follow-up:in 2 weeks from surgery date Disposition - Skilled nursing facility Discharged Condition: stable   Discharge Orders   Future Orders Complete By Expires   Call MD / Call 911  As directed    Comments:     If you experience chest pain or shortness of breath, CALL 911 and be transported to the hospital emergency room.  If you develope a fever above 101 F, pus (white drainage) or increased drainage or redness at the wound, or calf pain, call your surgeon's office.   Change dressing  As directed    Comments:     Change dressing daily with sterile 4 x 4 inch gauze dressing   Constipation Prevention  As directed    Comments:     Drink plenty of fluids.  Prune juice may be helpful.  You may use a stool softener, such as Colace (over the  counter) 100 mg twice a day.  Use MiraLax (over the counter) for constipation as needed.   Continue foley catheter  As directed    Comments:     Continue foley catheter upon discharge   Diet - low sodium heart healthy  As directed    Discharge instructions  As directed    Comments:     Walk with your walker. Weight bearing as instructed. Change your dressing daily. Shower only, no tub bath. You may shower starting 07/01/2013. For Tuesday-Thursday,  remove dressing and apply saran wrap over wound before shower. After shower, remove saran wrap and apply new dressing.  Call if any temperatures greater than 101 or any wound complications: [phone] during the day and ask for Dr. Jeannetta Ellis nurse, Mackey Birchwood.   Do not put a pillow under the knee. Place it under the heel.  As directed    Driving restrictions  As directed    Comments:     No driving   Increase activity slowly as tolerated  As directed        Medication List    STOP taking these medications       aspirin 81 MG tablet     CALTRATE PLUS PO     diclofenac 75 MG EC tablet  Commonly known as:  VOLTAREN     multivitamin with minerals Tabs tablet     OSTEO BI-FLEX TRIPLE STRENGTH PO     vitamin C 500 MG tablet  Commonly known as:  ASCORBIC ACID      TAKE these medications       acetaminophen 500 MG tablet  Commonly known as:  TYLENOL  Take 1,000 mg by mouth daily.     bisacodyl 10 MG suppository  Commonly known as:  DULCOLAX  Place 1 suppository (10 mg total) rectally daily as needed.     ferrous sulfate 325 (65 FE) MG tablet  Take 1 tablet (325 mg total) by mouth 3 (three) times daily after meals.     levothyroxine 50 MCG tablet  Commonly known as:  SYNTHROID, LEVOTHROID  Take 50 mcg by mouth daily before breakfast.     methocarbamol 500 MG tablet  Commonly known as:  ROBAXIN  Take 1 tablet (500 mg total) by mouth every 6 (six) hours as needed.     oxyCODONE 5 MG immediate release tablet  Commonly  known as:  Oxy IR/ROXICODONE  Take 1-2 tablets (5-10 mg total) by mouth every 3 (three) hours as needed.     polyethylene glycol packet  Commonly known as:  MIRALAX / GLYCOLAX  Take 17 g by mouth daily as needed.     rivaroxaban 10 MG Tabs tablet  Commonly known as:  XARELTO  Take 1 tablet (10 mg total) by mouth daily with breakfast.     tamsulosin 0.4 MG Caps capsule  Commonly known as:  FLOMAX  Take 1 capsule (0.4 mg total) by mouth daily.           Follow-up Information   Follow up with GIOFFRE,RONALD A, MD. Schedule an appointment as soon as possible for a visit in 2 weeks. (from surgery date)    Specialty:  Orthopedic Surgery   Contact information:   [address] [place] [address] [phone]       Signed: Dimitri Ped [facility] 07/01/2013, 7:06 AM

## 2013-07-01 NOTE — Assessment & Plan Note (Signed)
Controlled.  

## 2013-07-01 NOTE — Progress Notes (Signed)
Clinical Social Work Department CLINICAL SOCIAL WORK PLACEMENT NOTE 07/01/2013  Patient:  William Roach, William Roach  Account Number:  [id number] Admit date:  06/27/2013  Clinical Social Worker:  Doroteo Glassman  Date/time:  06/29/2013 04:27 PM  Clinical Social Work is seeking post-discharge placement for this patient at the following level of care:   SKILLED NURSING   (*CSW will update this form in Epic as items are completed)   06/29/2013  Patient/family provided with [facility] Department of Clinical Social Work's list of facilities offering this level of care within the geographic area requested by the patient (or if unable, by the patient's family).  06/29/2013  Patient/family informed of their freedom to choose among providers that offer the needed level of care, that participate in Medicare, Medicaid or managed care program needed by the patient, have an available bed and are willing to accept the patient.  06/29/2013  Patient/family informed of [facility]' ownership interest in [facility], as well as of the fact that they are under no obligation to receive care at this facility.  PASARR submitted to [employer] on 06/29/2013 PASARR number received from [employer] on 06/29/2013  FL2 transmitted to all facilities in geographic area requested by pt/family on  06/29/2013 FL2 transmitted to all facilities within larger geographic area on   Patient informed that his/her managed care company has contracts with or will negotiate with  certain facilities, including the following:     Patient/family informed of bed offers received:  07/01/2013 Patient chooses bed at [facility] Physician recommends and patient chooses bed at    Patient to be transferred to [facility] on  07/01/2013 Patient to be transferred to facility by [facility]  The following physician request were entered in Epic:   Additional Comments:  Pt / family are aware that insurance may not cover cost of ambulance  transport.  Cori Razor LCSW [phone]

## 2013-07-01 NOTE — Progress Notes (Signed)
Physical Therapy Treatment Patient Details Name: William Roach MRN: [id number] DOB: [birth year] Today's Date: 07/01/2013 Time: 4098-1191 PT Time Calculation (min): 29 min  PT Assessment / Plan / Recommendation  History of Present Illness 79 yo male s/p L TKA 9/26. Hx of CAD, CABG, dizziness.    PT Comments   POD#4 am session.  Assisted pt OOB to amb in hallway then performed TKR TE's followed by ICE.    Follow Up Recommendations  SNF     Does the patient have the potential to tolerate intense rehabilitation     Barriers to Discharge        Equipment Recommendations  None recommended by PT    Recommendations for Other Services    Frequency 7X/week   Progress towards PT Goals Progress towards PT goals: Progressing toward goals  Plan      Precautions / Restrictions Precautions Precautions: Knee;Fall Precaution Comments: instructed pt on KI use for amb Required Braces or Orthoses: Knee Immobilizer - Left Knee Immobilizer - Left: Discontinue once straight leg raise with < 10 degree lag Restrictions Weight Bearing Restrictions: No LLE Weight Bearing: Weight bearing as tolerated   Pertinent Vitals/Pain C/o 4/10 pain    Mobility  Bed Mobility Bed Mobility: Supine to Sit Supine to Sit: 4: Min assist Details for Bed Mobility Assistance: assisted OOB with increased time Transfers Transfers: Sit to Stand;Stand to Sit Sit to Stand: 3: Mod assist;From chair/3-in-1;From toilet Stand to Sit: 3: Mod assist;To chair/3-in-1;To toilet;To bed Details for Transfer Assistance: 50% Vc's on proper tech and hand placement plus increased time Ambulation/Gait Ambulation/Gait Assistance: 4: Min assist Ambulation Distance (Feet): 115 Feet Assistive device: Rolling walker Ambulation/Gait Assistance Details: increased time and 25% VC's on safety with turns Gait Pattern: Decreased stride length;Decreased step length - right;Decreased step length - left;Step-to pattern;Step-through  pattern;Trunk flexed;Antalgic;Right flexed knee in stance;Left flexed knee in stance Gait velocity: decreased     PT Goals (current goals can now be found in the care plan section)    Visit Information  Last PT Received On: 07/01/13 Assistance Needed: +1 History of Present Illness: 79 yo male s/p L TKA 9/26. Hx of CAD, CABG, dizziness.     Subjective Data      Cognition       Balance     End of Session PT - End of Session Equipment Utilized During Treatment: Gait belt;Left knee immobilizer Activity Tolerance: Patient limited by fatigue;Patient limited by pain Patient left: in chair;with call bell/phone within reach;with family/visitor present   Felecia Shelling  PTA [facility]  Acute  Rehab Pager      [phone]

## 2013-07-01 NOTE — Assessment & Plan Note (Addendum)
Xarelto post op thromboembolic risk reduction. Here for Rehab-goal [place] to return [place] @ [facility]

## 2013-07-03 ENCOUNTER — Non-Acute Institutional Stay (SKILLED_NURSING_FACILITY): Payer: Medicare Other | Admitting: Internal Medicine

## 2013-07-03 DIAGNOSIS — K5900 Constipation, unspecified: Secondary | ICD-10-CM

## 2013-07-03 DIAGNOSIS — D62 Acute posthemorrhagic anemia: Secondary | ICD-10-CM

## 2013-07-03 DIAGNOSIS — R531 Weakness: Secondary | ICD-10-CM

## 2013-07-03 DIAGNOSIS — N400 Enlarged prostate without lower urinary tract symptoms: Secondary | ICD-10-CM

## 2013-07-03 DIAGNOSIS — R5381 Other malaise: Secondary | ICD-10-CM

## 2013-07-03 DIAGNOSIS — E785 Hyperlipidemia, unspecified: Secondary | ICD-10-CM

## 2013-07-03 DIAGNOSIS — R269 Unspecified abnormalities of gait and mobility: Secondary | ICD-10-CM

## 2013-07-03 DIAGNOSIS — I10 Essential (primary) hypertension: Secondary | ICD-10-CM

## 2013-07-03 DIAGNOSIS — I2510 Atherosclerotic heart disease of native coronary artery without angina pectoris: Secondary | ICD-10-CM

## 2013-07-03 DIAGNOSIS — Z96659 Presence of unspecified artificial knee joint: Secondary | ICD-10-CM

## 2013-07-04 ENCOUNTER — Encounter: Payer: Self-pay | Admitting: Nurse Practitioner

## 2013-07-04 ENCOUNTER — Non-Acute Institutional Stay (SKILLED_NURSING_FACILITY): Payer: Medicare Other | Admitting: Nurse Practitioner

## 2013-07-04 DIAGNOSIS — D62 Acute posthemorrhagic anemia: Secondary | ICD-10-CM

## 2013-07-04 DIAGNOSIS — F4323 Adjustment disorder with mixed anxiety and depressed mood: Secondary | ICD-10-CM

## 2013-07-04 DIAGNOSIS — K5900 Constipation, unspecified: Secondary | ICD-10-CM

## 2013-07-04 DIAGNOSIS — E039 Hypothyroidism, unspecified: Secondary | ICD-10-CM

## 2013-07-04 DIAGNOSIS — I10 Essential (primary) hypertension: Secondary | ICD-10-CM

## 2013-07-04 DIAGNOSIS — M1712 Unilateral primary osteoarthritis, left knee: Secondary | ICD-10-CM

## 2013-07-04 DIAGNOSIS — M1710 Unilateral primary osteoarthritis, unspecified knee: Secondary | ICD-10-CM

## 2013-07-04 HISTORY — DX: Adjustment disorder with mixed anxiety and depressed mood: F43.23

## 2013-07-04 LAB — CBC AND DIFFERENTIAL
HCT: 24 % — AB (ref 41–53)
Hemoglobin: 7.9 g/dL — AB (ref 13.5–17.5)
Platelets: 274 10*3/uL (ref 150–399)
WBC: 7.9 10^3/mL

## 2013-07-04 NOTE — Assessment & Plan Note (Signed)
S/p TKR-incision intact with mild erythema and serosanguinous drainage seen on 4x4 gauze--will apply dry dressing daily and prn. WBAT. Oxycodone 5mg  1-2 q3 hrs prn available to him. Here for PT/OT and goal is to return [place] @ [facility] when able.

## 2013-07-04 NOTE — Assessment & Plan Note (Signed)
Continue Fe 325mg  tid. Hgb 7.9 07/04/13--was 8.1 07/01/13-bloody/serosanguinous drainage from the left knee surgical incision seen on dry dressing 2-3 times/day until today 07/04/13. Ortho was informed. Will repeat CBC in am.

## 2013-07-04 NOTE — Assessment & Plan Note (Signed)
Controlled.  

## 2013-07-04 NOTE — Assessment & Plan Note (Signed)
Likely related to the recent surgery, relocation, post anesthesia, anemia. Pain--will have prn Lorazepam 0.5mg  q4hr prn available to him.

## 2013-07-04 NOTE — Assessment & Plan Note (Signed)
Takes Levothyroxine , update TSH and CMP pending.

## 2013-07-04 NOTE — Assessment & Plan Note (Signed)
Managed with MiraLax dailly and prn Bisacodyl suppository prn.

## 2013-07-04 NOTE — Progress Notes (Signed)
Patient ID: William Roach, male   DOB: [birth year], 79 y.o.   MRN: [id number]  No Known Allergies  Chief Complaint  Patient presents with  . Medical Managment of Chronic Issues    anxiety, post op anemia    HPI: Patient is a 78 y.o. male seen in the SNF at [facility] today for evaluation of s/p left TKR, post op anemia with Hgb 7.9 07/04/13, anxiety, left knee pain,   and other chronic medical conditions. Hospitalized 06/27/13-07/01/13 for total left knee arthroplasty.   Problem List Items Addressed This Visit   Acute blood loss anemia - Primary     Continue Fe 325mg  tid. Hgb 7.9 07/04/13--was 8.1 07/01/13-bloody/serosanguinous drainage from the left knee surgical incision seen on dry dressing 2-3 times/day until today 07/04/13. Ortho was informed. Will repeat CBC in am.       Adjustment disorder with mixed anxiety and depressed mood     Likely related to the recent surgery, relocation, post anesthesia, anemia. Pain--will have prn Lorazepam 0.5mg  q4hr prn available to him.     HTN (hypertension)     Controlled.       Osteoarthritis of left knee     S/p TKR-incision intact with mild erythema and serosanguinous drainage seen on 4x4 gauze--will apply dry dressing daily and prn. WBAT. Oxycodone 5mg  1-2 q3 hrs prn available to him. Here for PT/OT and goal is to return [place] @ [facility] when able.       Unspecified constipation     Managed with MiraLax dailly and prn Bisacodyl suppository prn.        Unspecified hypothyroidism     Takes Levothyroxine , update TSH and CMP pending.          Review of Systems:  Review of Systems  Constitutional: Positive for malaise/fatigue. Negative for fever, chills, weight loss and diaphoresis.  HENT: Negative for hearing loss, ear pain, nosebleeds, congestion, sore throat, neck pain, tinnitus and ear discharge.   Eyes: Negative for blurred vision, double vision, photophobia, pain, discharge and redness.  Respiratory: Negative for cough,  hemoptysis, sputum production, shortness of breath, wheezing and stridor.   Cardiovascular: Positive for leg swelling. Negative for chest pain, palpitations, orthopnea, claudication and PND.       Mild RLE  Gastrointestinal: Negative for heartburn, nausea, vomiting, abdominal pain, diarrhea, constipation, blood in stool and melena.  Genitourinary: Negative for dysuria, urgency, frequency, hematuria and flank pain.       Foley with clear urine collection-remove in am  Musculoskeletal: Positive for joint pain. Negative for myalgias, back pain and falls.       Left knee  Skin: Negative for itching and rash.       The right knee surgical incision is intact with mild erythema and small amount serosanguinous drainage.   Neurological: Positive for weakness. Negative for dizziness, tingling, tremors, sensory change, speech change, focal weakness, seizures, loss of consciousness and headaches.  Endo/Heme/Allergies: Negative for environmental allergies and polydipsia. Does not bruise/bleed easily.  Psychiatric/Behavioral: Negative for depression, suicidal ideas, hallucinations, memory loss and substance abuse. The patient is nervous/anxious. The patient does not have insomnia.      Past Medical History  Diagnosis Date  . Coronary artery disease     post CABG x4   . Hypertension   . Hypercholesterolemia   . Fatigue   . Dizziness   . Basal cell carcinoma, ear     post -- partial pinnectomy left side with primary closure    .  Hypothyroidism   . History of kidney stones     x1   Past Surgical History  Procedure Laterality Date  . Coronary artery bypass graft  2003    x4 --   . Cholecystectomy  2002  . Inguinal hernia repair  09/19/2007  . Ear cyst excision  12/14/2009    Excision of left auricular cyst with primary closure  . Basal cell carcinoma excision  01/14/2010    Wedge resection, partial pinnectomy left side with primary closure      . Tonsillectomy    . Carpal tunnel release    .  Cataract surgery Bilateral 06-24-13  . Tonsillectomy    . Cystoscopy    . Total knee arthroplasty Left 06/27/2013    Procedure: LEFT TOTAL KNEE ARTHROPLASTY;  Surgeon: Jacki Cones, MD;  Location: [facility];  Service: Orthopedics;  Laterality: Left;   Social History:   reports that he quit smoking about 32 years ago. He does not have any smokeless tobacco history on file. He reports that he does not drink alcohol or use illicit drugs.  Family History  Problem Relation Age of Onset  . Parkinsonism Father   . Angina Mother   . Coronary artery disease Brother     with CABG    Medications: Patient's Medications  New Prescriptions   No medications on file  Previous Medications   ACETAMINOPHEN (TYLENOL) 500 MG TABLET    Take 1,000 mg by mouth daily.   BISACODYL (DULCOLAX) 10 MG SUPPOSITORY    Place 1 suppository (10 mg total) rectally daily as needed.   FERROUS SULFATE 325 (65 FE) MG TABLET    Take 1 tablet (325 mg total) by mouth 3 (three) times daily after meals.   LEVOTHYROXINE (SYNTHROID, LEVOTHROID) 50 MCG TABLET    Take 50 mcg by mouth daily before breakfast.    METHOCARBAMOL (ROBAXIN) 500 MG TABLET    Take 1 tablet (500 mg total) by mouth every 6 (six) hours as needed.   OXYCODONE (OXY IR/ROXICODONE) 5 MG IMMEDIATE RELEASE TABLET    Take 1-2 tablets (5-10 mg total) by mouth every 3 (three) hours as needed.   POLYETHYLENE GLYCOL (MIRALAX / GLYCOLAX) PACKET    Take 17 g by mouth daily as needed.   RIVAROXABAN (XARELTO) 10 MG TABS TABLET    Take 1 tablet (10 mg total) by mouth daily with breakfast.   TAMSULOSIN (FLOMAX) 0.4 MG CAPS CAPSULE    Take 1 capsule (0.4 mg total) by mouth daily.  Modified Medications   No medications on file  Discontinued Medications   No medications on file     Physical Exam: Physical Exam  Constitutional: He is oriented to person, place, and time. He appears well-developed and well-nourished. No distress.  HENT:  Head: Normocephalic and atraumatic.   Right Ear: External ear normal.  Left Ear: External ear normal.  Nose: Nose normal.  Mouth/Throat: Oropharynx is clear and moist. No oropharyngeal exudate.  Eyes: Conjunctivae and EOM are normal. Pupils are equal, round, and reactive to light. Right eye exhibits no discharge. Left eye exhibits no discharge. No scleral icterus.  Neck: Normal range of motion. Neck supple. No JVD present. No tracheal deviation present. No thyromegaly present.  Cardiovascular: Normal rate, regular rhythm, normal heart sounds and intact distal pulses.   No murmur heard. Pulmonary/Chest: Effort normal and breath sounds normal. No stridor. No respiratory distress. He has no wheezes. He has no rales. He exhibits no tenderness.  Abdominal: Soft. Bowel sounds  are normal. He exhibits no distension. There is no tenderness. There is no rebound and no guarding.  Genitourinary: Penis normal.  Musculoskeletal: Normal range of motion. He exhibits edema and tenderness.  The right knee surgical incision intact with mild erythema and small amount of serosanguinous drainage. Pain with limited PROM noted. Able to wiggle his toes. The chronic tingling and numbness in BLE are no change.    Lymphadenopathy:    He has no cervical adenopathy.  Neurological: He is alert and oriented to person, place, and time. He has normal reflexes. No cranial nerve deficit. He exhibits normal muscle tone. Coordination normal.  Skin: Skin is warm and dry. No rash noted. He is not diaphoretic. There is erythema. No pallor.  The left knee surgical incision is intact, bloody/serosnaguinous drainage soaked through dry dressing. Left knee and surrounding area extended to popliteal, lower thigh, upper lower leg fever, redness, and swelling-Ortho informed.   Psychiatric: His behavior is normal. Judgment and thought content normal. His mood appears anxious.    Filed Vitals:   07/04/13 1232  BP: 110/60  Pulse: 82  Temp: 97 F (36.1 C)  TempSrc: Tympanic   Resp: 18      Labs reviewed: Basic Metabolic Panel:  Recent Labs  16/10/96 1430 06/28/13 0541 06/29/13 0540  NA 136 136 135  K 4.9 4.1 3.7  CL 101 103 102  CO2 26 26 24   GLUCOSE 97 115* 121*  BUN 31* 19 22  CREATININE 0.98 0.83 0.81  CALCIUM 9.7 8.6 8.5   Liver Function Tests:  Recent Labs  04/09/13 1030 05/14/13 0927 06/24/13 1430  AST 83* 33 30  ALT 137* 40 22  ALKPHOS 76 71 87  BILITOT 1.0 1.1 0.6  PROT 6.6 6.3 6.7  ALBUMIN 4.1 3.9 3.9   CBC:  Recent Labs  06/29/13 0540 06/30/13 0425 06/30/13 1515 07/01/13 0428 07/04/13  WBC 12.9* 9.7  --  9.3 7.9  NEUTROABS  --   --   --  7.0  --   HGB 9.6* 8.3* 8.6* 8.1* 7.9*  HCT 28.2* 24.9* 24.9* 23.5* 24*  MCV 93.1 92.6  --  92.5  --   PLT 113* 92*  --  117* 274   Lipid Panel:  Recent Labs  04/09/13 1030  CHOL 138  HDL 40.10  LDLCALC 69  TRIG 144.0  CHOLHDL 3    Past Procedures:  06/24/2013 History of CABG in 2003. Hypertension. EXAM: CHEST 2 VIEW COMPARISON: 01/14/2010 FINDINGS: The patient has had median sternotomy. IMPRESSION: No active cardiopulmonary disease.   06/27/2013 *RADIOLOGY REPORT* Clinical Data: Status post left total knee replacement. PORTABLE LEFT KNEE - 1-2 VIEW Comparison: 05/29/2013. Findings: The patient is status post left total knee arthroplasty. IMPRESSION: Interval left knee arthroplasty with expected postoperative findings.   Assessment/Plan Acute blood loss anemia Continue Fe 325mg  tid. Hgb 7.9 07/04/13--was 8.1 07/01/13-bloody/serosanguinous drainage from the left knee surgical incision seen on dry dressing 2-3 times/day until today 07/04/13. Ortho was informed. Will repeat CBC in am.     HTN (hypertension) Controlled.     Unspecified hypothyroidism Takes Levothyroxine , update TSH and CMP pending.     Unspecified constipation Managed with MiraLax dailly and prn Bisacodyl suppository prn.      Osteoarthritis of left knee S/p TKR-incision intact with mild  erythema and serosanguinous drainage seen on 4x4 gauze--will apply dry dressing daily and prn. WBAT. Oxycodone 5mg  1-2 q3 hrs prn available to him. Here for PT/OT and goal is to return  [place] @ [facility] when able.     Adjustment disorder with mixed anxiety and depressed mood Likely related to the recent surgery, relocation, post anesthesia, anemia. Pain--will have prn Lorazepam 0.5mg  q4hr prn available to him.     Family/ Staff Communication: observe the patient.   Goals of Care: [place] Here for rehab s/p left TKR  Labs/tests ordered: CBC, CMP, TSH pending.

## 2013-07-07 LAB — HEPATIC FUNCTION PANEL
ALT: 19 U/L (ref 10–40)
Alkaline Phosphatase: 114 U/L (ref 25–125)
Bilirubin, Total: 1.4 mg/dL

## 2013-07-07 LAB — BASIC METABOLIC PANEL: Sodium: 138 mmol/L (ref 137–147)

## 2013-07-07 LAB — TSH: TSH: 2.35 u[IU]/mL (ref 0.41–5.90)

## 2013-07-07 LAB — CBC AND DIFFERENTIAL
HCT: 26 % — AB (ref 41–53)
Hemoglobin: 8.5 g/dL — AB (ref 13.5–17.5)
Platelets: 409 10*3/uL — AB (ref 150–399)

## 2013-07-29 ENCOUNTER — Non-Acute Institutional Stay (SKILLED_NURSING_FACILITY): Payer: Medicare Other | Admitting: Nurse Practitioner

## 2013-07-29 ENCOUNTER — Encounter: Payer: Self-pay | Admitting: Nurse Practitioner

## 2013-07-29 DIAGNOSIS — R413 Other amnesia: Secondary | ICD-10-CM

## 2013-07-29 DIAGNOSIS — Z96659 Presence of unspecified artificial knee joint: Secondary | ICD-10-CM

## 2013-07-29 DIAGNOSIS — D62 Acute posthemorrhagic anemia: Secondary | ICD-10-CM

## 2013-07-29 DIAGNOSIS — K5900 Constipation, unspecified: Secondary | ICD-10-CM

## 2013-07-29 DIAGNOSIS — L8990 Pressure ulcer of unspecified site, unspecified stage: Secondary | ICD-10-CM

## 2013-07-29 DIAGNOSIS — I739 Peripheral vascular disease, unspecified: Secondary | ICD-10-CM

## 2013-07-29 DIAGNOSIS — L8992 Pressure ulcer of unspecified site, stage 2: Secondary | ICD-10-CM

## 2013-07-29 DIAGNOSIS — N400 Enlarged prostate without lower urinary tract symptoms: Secondary | ICD-10-CM

## 2013-07-29 DIAGNOSIS — E039 Hypothyroidism, unspecified: Secondary | ICD-10-CM

## 2013-07-29 DIAGNOSIS — Z96652 Presence of left artificial knee joint: Secondary | ICD-10-CM

## 2013-07-29 HISTORY — DX: Peripheral vascular disease, unspecified: I73.9

## 2013-07-29 HISTORY — DX: Other amnesia: R41.3

## 2013-07-29 NOTE — Assessment & Plan Note (Signed)
Takes Tamsulosin. No urinary retention.    

## 2013-07-29 NOTE — Assessment & Plan Note (Signed)
Per arterial US 07/04/13 diminished flow especially in the left common femoral artery and deep femoral artery areas. Risk reduction.     

## 2013-07-29 NOTE — Assessment & Plan Note (Addendum)
New, left heel, pressure reduction and protective measures, not infected. Post op pressure, swelling, and PAD of the LLE-all contributed to it.

## 2013-07-29 NOTE — Assessment & Plan Note (Signed)
Takes Levothyroxine 50mcg, TSH 2.35 07/07/13               

## 2013-07-29 NOTE — Assessment & Plan Note (Addendum)
Xarelto post op thromboembolic risk reduction-discontinued 07/14/13. He was place don ASA 325mg  bid subsequently. Left knee: 4 weeks postop. Pain is managed with prn Oxycodone. Swelling and erythema presented. WBAT.

## 2013-07-29 NOTE — Assessment & Plan Note (Signed)
Managed with MiraLax dailly and prn Bisacodyl suppository prn.     

## 2013-07-29 NOTE — Progress Notes (Signed)
Patient ID: William Roach, male   DOB: 03-20-1921, 79 y.o.   MRN: [id number]  No Known Allergies  Chief Complaint  Patient presents with  . Medical Managment of Chronic Issues    memory, left heel pressure ulcer, s/p left TKR  . Acute Visit    HPI: Patient is a 79 y.o. male seen in the SNF at [facility] today for evaluation of s/p left TKR, post op anemia, memory, anxiety, left knee pain,   and other chronic medical conditions.   Problem List Items Addressed This Visit   Acute blood loss anemia - Primary     Continue Fe 325mg  tid. Hgb 7.9 07/04/13--8.5 07/07/13. Update CBC        BPH (benign prostatic hyperplasia)     Takes Tamsulosin. No urinary retention.       Memory deficit     Mild, MMSE 25/30, repeat MMSE in 3 months. May consider memory preserving medication.     PAD (peripheral artery disease)     Per arterial US 07/04/13 diminished flow especially in the left common femoral artery and deep femoral artery areas. Risk reduction.     Pressure ulcer stage II     New, left heel, pressure reduction and protective measures, not infected. Post op pressure, swelling, and PAD of the LLE-all contributed to it.     S/P knee replacement     Xarelto post op thromboembolic risk reduction-discontinued 07/14/13. He was place don ASA 325mg  bid subsequently. Left knee: 4 weeks postop. Pain is managed with prn Oxycodone. Swelling and erythema presented. WBAT.      Unspecified constipation     Managed with MiraLax dailly and prn Bisacodyl suppository prn.          Unspecified hypothyroidism     Takes Levothyroxine , TSH 2.35 07/07/13           Review of Systems:  Review of Systems  Constitutional: Negative for fever, chills, weight loss, malaise/fatigue and diaphoresis.  HENT: Negative for congestion, ear discharge, ear pain, hearing loss, nosebleeds, sore throat and tinnitus.   Eyes: Negative for blurred vision, double vision, photophobia, pain, discharge  and redness.  Respiratory: Negative for cough, hemoptysis, sputum production, shortness of breath, wheezing and stridor.   Cardiovascular: Positive for leg swelling. Negative for chest pain, palpitations, orthopnea, claudication and PND.       Mild LLE  Gastrointestinal: Negative for heartburn, nausea, vomiting, abdominal pain, diarrhea, constipation, blood in stool and melena.  Genitourinary: Negative for dysuria, urgency, frequency, hematuria and flank pain.  Musculoskeletal: Positive for joint pain. Negative for back pain, falls, myalgias and neck pain.       Left knee  Skin: Negative for itching and rash.       The left knee surgical incision is healed.   Neurological: Positive for weakness. Negative for dizziness, tingling, tremors, sensory change, speech change, focal weakness, seizures, loss of consciousness and headaches.  Endo/Heme/Allergies: Negative for environmental allergies and polydipsia. Does not bruise/bleed easily.  Psychiatric/Behavioral: Positive for memory loss. Negative for depression, suicidal ideas, hallucinations and substance abuse. The patient is nervous/anxious. The patient does not have insomnia.      Past Medical History  Diagnosis Date  . Coronary artery disease     post CABG x4   . Hypertension   . Hypercholesterolemia   . Fatigue   . Dizziness   . Basal cell carcinoma, ear     post -- partial pinnectomy left side with primary closure    .  Hypothyroidism   . History of kidney stones     x1   Past Surgical History  Procedure Laterality Date  . Coronary artery bypass graft  2003    x4 --   . Cholecystectomy  2002  . Inguinal hernia repair  09/19/2007  . Ear cyst excision  12/14/2009    Excision of left auricular cyst with primary closure  . Basal cell carcinoma excision  01/14/2010    Wedge resection, partial pinnectomy left side with primary closure      . Tonsillectomy    . Carpal tunnel release    . Cataract surgery Bilateral 06-24-13  .  Tonsillectomy    . Cystoscopy    . Total knee arthroplasty Left 06/27/2013    Procedure: LEFT TOTAL KNEE ARTHROPLASTY;  Surgeon: Jacki Cones, MD;  Location: [facility];  Service: Orthopedics;  Laterality: Left;   Social History:   reports that he quit smoking about 32 years ago. He does not have any smokeless tobacco history on file. He reports that he does not drink alcohol or use illicit drugs.  Family History  Problem Relation Age of Onset  . Parkinsonism Father   . Angina Mother   . Coronary artery disease Brother     with CABG    Medications: Patient's Medications  New Prescriptions   No medications on file  Previous Medications   ACETAMINOPHEN (TYLENOL) 500 MG TABLET    Take 1,000 mg by mouth daily.   BISACODYL (DULCOLAX) 10 MG SUPPOSITORY    Place 1 suppository (10 mg total) rectally daily as needed.   FERROUS SULFATE 325 (65 FE) MG TABLET    Take 1 tablet (325 mg total) by mouth 3 (three) times daily after meals.   LEVOTHYROXINE (SYNTHROID, LEVOTHROID) 50 MCG TABLET    Take 50 mcg by mouth daily before breakfast.    METHOCARBAMOL (ROBAXIN) 500 MG TABLET    Take 1 tablet (500 mg total) by mouth every 6 (six) hours as needed.   OXYCODONE (OXY IR/ROXICODONE) 5 MG IMMEDIATE RELEASE TABLET    Take 1-2 tablets (5-10 mg total) by mouth every 3 (three) hours as needed.   POLYETHYLENE GLYCOL (MIRALAX / GLYCOLAX) PACKET    Take 17 g by mouth daily as needed.   RIVAROXABAN (XARELTO) 10 MG TABS TABLET    Take 1 tablet (10 mg total) by mouth daily with breakfast.   TAMSULOSIN (FLOMAX) 0.4 MG CAPS CAPSULE    Take 1 capsule (0.4 mg total) by mouth daily.  Modified Medications   No medications on file  Discontinued Medications   No medications on file     Physical Exam: Physical Exam  Constitutional: He is oriented to person, place, and time. He appears well-developed and well-nourished. No distress.  HENT:  Head: Normocephalic and atraumatic.  Right Ear: External ear normal.  Left  Ear: External ear normal.  Nose: Nose normal.  Mouth/Throat: Oropharynx is clear and moist. No oropharyngeal exudate.  Eyes: Conjunctivae and EOM are normal. Pupils are equal, round, and reactive to light. Right eye exhibits no discharge. Left eye exhibits no discharge. No scleral icterus.  Neck: Normal range of motion. Neck supple. No JVD present. No tracheal deviation present. No thyromegaly present.  Cardiovascular: Normal rate, regular rhythm, normal heart sounds and intact distal pulses.   No murmur heard. Pulmonary/Chest: Effort normal and breath sounds normal. No stridor. No respiratory distress. He has no wheezes. He has no rales. He exhibits no tenderness.  Abdominal: Soft. Bowel sounds  are normal. He exhibits no distension. There is no tenderness. There is no rebound and no guarding.  Genitourinary: Penis normal.  Musculoskeletal: Normal range of motion. He exhibits edema and tenderness.  The right knee surgical incision healed. Left knee and LLE swelling, warmth, tenderness still present.   Lymphadenopathy:    He has no cervical adenopathy.  Neurological: He is alert and oriented to person, place, and time. He has normal reflexes. No cranial nerve deficit. He exhibits normal muscle tone. Coordination normal.  Skin: Skin is warm and dry. No rash noted. He is not diaphoretic. There is erythema. No pallor.  The left knee surgical incision is healed.   Psychiatric: His behavior is normal. Judgment and thought content normal. His mood appears anxious. Cognition and memory are impaired. He exhibits abnormal recent memory.  Flat affect    Filed Vitals:   07/29/13 1444  BP: 112/65  Pulse: 74  Temp: 97.6 F (36.4 C)  TempSrc: Tympanic  Resp: 18      Labs reviewed: Basic Metabolic Panel:  Recent Labs  16/10/96 1430 06/28/13 0541 06/29/13 0540 07/07/13  NA 136 136 135 138  K 4.9 4.1 3.7 3.8  CL 101 103 102  --   CO2 26 26 24   --   GLUCOSE 97 115* 121*  --   BUN 31* 19 22  20   CREATININE 0.98 0.83 0.81 0.7  CALCIUM 9.7 8.6 8.5  --   TSH  --   --   --  2.35   Liver Function Tests:  Recent Labs  04/09/13 1030 05/14/13 0927 06/24/13 1430 07/07/13  AST 83* 33 30 19  ALT 137* 40 22 19  ALKPHOS 76 71 87 114  BILITOT 1.0 1.1 0.6  --   PROT 6.6 6.3 6.7  --   ALBUMIN 4.1 3.9 3.9  --    CBC:  Recent Labs  06/29/13 0540 06/30/13 0425 06/30/13 1515 07/01/13 0428 07/04/13 07/07/13  WBC 12.9* 9.7  --  9.3 7.9 9.1  NEUTROABS  --   --   --  7.0  --   --   HGB 9.6* 8.3* 8.6* 8.1* 7.9* 8.5*  HCT 28.2* 24.9* 24.9* 23.5* 24* 26*  MCV 93.1 92.6  --  92.5  --   --   PLT 113* 92*  --  117* 274 409*   Lipid Panel:  Recent Labs  04/09/13 1030  CHOL 138  HDL 40.10  LDLCALC 69  TRIG 144.0  CHOLHDL 3    Past Procedures:  06/24/2013 History of CABG in 2003. Hypertension. EXAM: CHEST 2 VIEW COMPARISON: 01/14/2010 FINDINGS: The patient has had median sternotomy. IMPRESSION: No active cardiopulmonary disease.   06/27/2013 *RADIOLOGY REPORT* Clinical Data: Status post left total knee replacement. PORTABLE LEFT KNEE - 1-2 VIEW Comparison: 05/29/2013. Findings: The patient is status post left total knee arthroplasty. IMPRESSION: Interval left knee arthroplasty with expected postoperative findings.   07/04/13 LLE arterial Duplex US(ordered by Ella Bodo NP 07/03/13) PAD diminished flow especially in teh left common femoral artery and deep femoral artery areas.   Assessment/Plan Acute blood loss anemia Continue Fe 325mg  tid. Hgb 7.9 07/04/13--8.5 07/07/13. Update CBC      Unspecified hypothyroidism Takes Levothyroxine , TSH 2.35 07/07/13      S/P knee replacement Xarelto post op thromboembolic risk reduction-discontinued 07/14/13. He was place don ASA 325mg  bid subsequently. Left knee: 4 weeks postop. Pain is managed with prn Oxycodone. Swelling and erythema presented. WBAT.    Pressure ulcer stage  II New, left heel, pressure reduction and  protective measures, not infected. Post op pressure, swelling, and PAD of the LLE-all contributed to it.   Memory deficit Mild, MMSE 25/30, repeat MMSE in 3 months. May consider memory preserving medication.   Unspecified constipation Managed with MiraLax dailly and prn Bisacodyl suppository prn.        BPH (benign prostatic hyperplasia) Takes Tamsulosin. No urinary retention.     PAD (peripheral artery disease) Per arterial US 07/04/13 diminished flow especially in the left common femoral artery and deep femoral artery areas. Risk reduction.     Family/ Staff Communication: observe the patient.   Goals of Care: [place] Here for rehab s/p left TKR  Labs/tests ordered: CBC. MMSE in 3 months.

## 2013-07-29 NOTE — Assessment & Plan Note (Signed)
Mild, MMSE 25/30, repeat MMSE in 3 months. May consider memory preserving medication.

## 2013-07-29 NOTE — Assessment & Plan Note (Signed)
Continue Fe 325mg  tid. Hgb 7.9 07/04/13--8.5 07/07/13. Update CBC

## 2013-08-15 ENCOUNTER — Non-Acute Institutional Stay (SKILLED_NURSING_FACILITY): Payer: Medicare Other | Admitting: Nurse Practitioner

## 2013-08-15 ENCOUNTER — Encounter: Payer: Self-pay | Admitting: Nurse Practitioner

## 2013-08-15 DIAGNOSIS — D62 Acute posthemorrhagic anemia: Secondary | ICD-10-CM

## 2013-08-15 DIAGNOSIS — I10 Essential (primary) hypertension: Secondary | ICD-10-CM

## 2013-08-15 DIAGNOSIS — E039 Hypothyroidism, unspecified: Secondary | ICD-10-CM

## 2013-08-15 DIAGNOSIS — K5900 Constipation, unspecified: Secondary | ICD-10-CM

## 2013-08-15 DIAGNOSIS — N400 Enlarged prostate without lower urinary tract symptoms: Secondary | ICD-10-CM

## 2013-08-15 DIAGNOSIS — R634 Abnormal weight loss: Secondary | ICD-10-CM

## 2013-08-15 NOTE — Assessment & Plan Note (Signed)
Decrease Fe 325mg  bid.  Improved Hgb 7.9 07/04/13--8.5 07/07/13-10.6 07/31/13. F/u CBC in 2 weeks.

## 2013-08-15 NOTE — Assessment & Plan Note (Signed)
Takes Levothyroxine 50mcg, TSH 2.35 07/07/13               

## 2013-08-15 NOTE — Assessment & Plan Note (Signed)
Controlled.  

## 2013-08-15 NOTE — Assessment & Plan Note (Signed)
Adding Senna II qhs along with MiraLax dailly and prn Bisacodyl suppository prn.

## 2013-08-15 NOTE — Assessment & Plan Note (Signed)
Related to the surgery. Will weight weekly x4 and dietary supplement.

## 2013-08-15 NOTE — Progress Notes (Signed)
Patient ID: William Roach, male   DOB: 26-Mar-1921, 79 y.o.   MRN: [id number]  No Known Allergies  Chief Complaint  Patient presents with  . Medical Managment of Chronic Issues    constipation, weight loss, anemia.  . Acute Visit    HPI: Patient is a 79 y.o. male seen in the SNF at [facility] today for evaluation of s/p left TKR, post op anemia, memory, anxiety, left knee pain, weight loss, constipation,  and other chronic medical conditions.   Problem List Items Addressed This Visit   Acute blood loss anemia - Primary     Decrease Fe 325mg  bid.  Improved Hgb 7.9 07/04/13--8.5 07/07/13-10.6 07/31/13. F/u CBC in 2 weeks.           BPH (benign prostatic hyperplasia)     Takes Tamsulosin. No urinary retention.         HTN (hypertension)     Controlled.         Loss of weight     Related to the surgery. Will weight weekly x4 and dietary supplement.     Unspecified constipation     Adding Senna II qhs along with MiraLax dailly and prn Bisacodyl suppository prn.            Unspecified hypothyroidism     Takes Levothyroxine , TSH 2.35 07/07/13           Review of Systems:  Review of Systems  Constitutional: Positive for weight loss. Negative for fever, chills, malaise/fatigue and diaphoresis.  HENT: Negative for congestion, ear discharge, ear pain, hearing loss, nosebleeds, sore throat and tinnitus.   Eyes: Negative for blurred vision, double vision, photophobia, pain, discharge and redness.  Respiratory: Negative for cough, hemoptysis, sputum production, shortness of breath, wheezing and stridor.   Cardiovascular: Positive for leg swelling. Negative for chest pain, palpitations, orthopnea, claudication and PND.       Mild LLE  Gastrointestinal: Positive for constipation. Negative for heartburn, nausea, vomiting, abdominal pain, diarrhea, blood in stool and melena.  Genitourinary: Negative for dysuria, urgency, frequency, hematuria and flank pain.   Musculoskeletal: Positive for joint pain. Negative for back pain, falls, myalgias and neck pain.       Left knee  Skin: Negative for itching and rash.       The left knee surgical incision is healed.   Neurological: Positive for weakness. Negative for dizziness, tingling, tremors, sensory change, speech change, focal weakness, seizures, loss of consciousness and headaches.  Endo/Heme/Allergies: Negative for environmental allergies and polydipsia. Does not bruise/bleed easily.  Psychiatric/Behavioral: Positive for memory loss. Negative for depression, suicidal ideas, hallucinations and substance abuse. The patient is nervous/anxious. The patient does not have insomnia.      Past Medical History  Diagnosis Date  . Coronary artery disease     post CABG x4   . Hypertension   . Hypercholesterolemia   . Fatigue   . Dizziness   . Basal cell carcinoma, ear     post -- partial pinnectomy left side with primary closure    . Hypothyroidism   . History of kidney stones     x1   Past Surgical History  Procedure Laterality Date  . Coronary artery bypass graft  2003    x4 --   . Cholecystectomy  2002  . Inguinal hernia repair  09/19/2007  . Ear cyst excision  12/14/2009    Excision of left auricular cyst with primary closure  . Basal cell carcinoma excision  01/14/2010    Wedge resection, partial pinnectomy left side with primary closure      . Tonsillectomy    . Carpal tunnel release    . Cataract surgery Bilateral 06-24-13  . Tonsillectomy    . Cystoscopy    . Total knee arthroplasty Left 06/27/2013    Procedure: LEFT TOTAL KNEE ARTHROPLASTY;  Surgeon: Jacki Cones, MD;  Location: [facility];  Service: Orthopedics;  Laterality: Left;   Social History:   reports that he quit smoking about 32 years ago. He does not have any smokeless tobacco history on file. He reports that he does not drink alcohol or use illicit drugs.  Family History  Problem Relation Age of Onset  . Parkinsonism  Father   . Angina Mother   . Coronary artery disease Brother     with CABG    Medications: Patient's Medications  New Prescriptions   No medications on file  Previous Medications   ACETAMINOPHEN (TYLENOL) 500 MG TABLET    Take 1,000 mg by mouth daily.   BISACODYL (DULCOLAX) 10 MG SUPPOSITORY    Place 1 suppository (10 mg total) rectally daily as needed.   FERROUS SULFATE 325 (65 FE) MG TABLET    Take 1 tablet (325 mg total) by mouth 3 (three) times daily after meals.   LEVOTHYROXINE (SYNTHROID, LEVOTHROID) 50 MCG TABLET    Take 50 mcg by mouth daily before breakfast.    METHOCARBAMOL (ROBAXIN) 500 MG TABLET    Take 1 tablet (500 mg total) by mouth every 6 (six) hours as needed.   OXYCODONE (OXY IR/ROXICODONE) 5 MG IMMEDIATE RELEASE TABLET    Take 1-2 tablets (5-10 mg total) by mouth every 3 (three) hours as needed.   POLYETHYLENE GLYCOL (MIRALAX / GLYCOLAX) PACKET    Take 17 g by mouth daily as needed.   RIVAROXABAN (XARELTO) 10 MG TABS TABLET    Take 1 tablet (10 mg total) by mouth daily with breakfast.   TAMSULOSIN (FLOMAX) 0.4 MG CAPS CAPSULE    Take 1 capsule (0.4 mg total) by mouth daily.  Modified Medications   No medications on file  Discontinued Medications   No medications on file     Physical Exam: Physical Exam  Constitutional: He is oriented to person, place, and time. He appears well-developed and well-nourished. No distress.  HENT:  Head: Normocephalic and atraumatic.  Right Ear: External ear normal.  Left Ear: External ear normal.  Nose: Nose normal.  Mouth/Throat: Oropharynx is clear and moist. No oropharyngeal exudate.  Eyes: Conjunctivae and EOM are normal. Pupils are equal, round, and reactive to light. Right eye exhibits no discharge. Left eye exhibits no discharge. No scleral icterus.  Neck: Normal range of motion. Neck supple. No JVD present. No tracheal deviation present. No thyromegaly present.  Cardiovascular: Normal rate, regular rhythm, normal heart  sounds and intact distal pulses.   No murmur heard. Pulmonary/Chest: Effort normal and breath sounds normal. No stridor. No respiratory distress. He has no wheezes. He has no rales. He exhibits no tenderness.  Abdominal: Soft. Bowel sounds are normal. He exhibits no distension. There is no tenderness. There is no rebound and no guarding.  Genitourinary: Penis normal.  Musculoskeletal: Normal range of motion. He exhibits edema and tenderness.   Left knee and LLE swelling, warmth, tenderness healing nicely.   Lymphadenopathy:    He has no cervical adenopathy.  Neurological: He is alert and oriented to person, place, and time. He has normal reflexes. No cranial nerve  deficit. He exhibits normal muscle tone. Coordination normal.  Skin: Skin is warm and dry. No rash noted. He is not diaphoretic. There is erythema. No pallor.  The left knee surgical incision is healed.   Psychiatric: His behavior is normal. Judgment and thought content normal. His mood appears anxious. Cognition and memory are impaired. He exhibits abnormal recent memory.  Flat affect    Filed Vitals:   08/15/13 1704  BP: 111/59  Pulse: 75  Temp: 98.1 F (36.7 C)  TempSrc: Tympanic  Resp: 20      Labs reviewed: Basic Metabolic Panel:  Recent Labs  16/10/96 1430 06/28/13 0541 06/29/13 0540 07/07/13  NA 136 136 135 138  K 4.9 4.1 3.7 3.8  CL 101 103 102  --   CO2 26 26 24   --   GLUCOSE 97 115* 121*  --   BUN 31* 19 22 20   CREATININE 0.98 0.83 0.81 0.7  CALCIUM 9.7 8.6 8.5  --   TSH  --   --   --  2.35   Liver Function Tests:  Recent Labs  04/09/13 1030 05/14/13 0927 06/24/13 1430 07/07/13  AST 83* 33 30 19  ALT 137* 40 22 19  ALKPHOS 76 71 87 114  BILITOT 1.0 1.1 0.6  --   PROT 6.6 6.3 6.7  --   ALBUMIN 4.1 3.9 3.9  --    CBC:  Recent Labs  06/29/13 0540 06/30/13 0425 06/30/13 1515 07/01/13 0428 07/04/13 07/07/13 07/31/13  WBC 12.9* 9.7  --  9.3 7.9 9.1 6.7  NEUTROABS  --   --   --  7.0   --   --   --   HGB 9.6* 8.3* 8.6* 8.1* 7.9* 8.5* 10.6*  HCT 28.2* 24.9* 24.9* 23.5* 24* 26* 34*  MCV 93.1 92.6  --  92.5  --   --   --   PLT 113* 92*  --  117* 274 409* 268   Lipid Panel:  Recent Labs  04/09/13 1030  CHOL 138  HDL 40.10  LDLCALC 69  TRIG 144.0  CHOLHDL 3    Past Procedures:  06/24/2013 History of CABG in 2003. Hypertension. EXAM: CHEST 2 VIEW COMPARISON: 01/14/2010 FINDINGS: The patient has had median sternotomy. IMPRESSION: No active cardiopulmonary disease.   06/27/2013 *RADIOLOGY REPORT* Clinical Data: Status post left total knee replacement. PORTABLE LEFT KNEE - 1-2 VIEW Comparison: 05/29/2013. Findings: The patient is status post left total knee arthroplasty. IMPRESSION: Interval left knee arthroplasty with expected postoperative findings.   07/04/13 LLE arterial Duplex US(ordered by Ella Bodo NP 07/03/13) PAD diminished flow especially in teh left common femoral artery and deep femoral artery areas.   Assessment/Plan Acute blood loss anemia Decrease Fe 325mg  bid.  Improved Hgb 7.9 07/04/13--8.5 07/07/13-10.6 07/31/13. F/u CBC in 2 weeks.         Loss of weight Related to the surgery. Will weight weekly x4 and dietary supplement.   Unspecified constipation Adding Senna II qhs along with MiraLax dailly and prn Bisacodyl suppository prn.          Unspecified hypothyroidism Takes Levothyroxine , TSH 2.35 07/07/13      HTN (hypertension) Controlled.       BPH (benign prostatic hyperplasia) Takes Tamsulosin. No urinary retention.         Family/ Staff Communication: observe the patient.   Goals of Care: IL. Here for rehab s/p left TKR  Labs/tests ordered: CBC 2 weeks. MMSE in 3 months.

## 2013-08-15 NOTE — Assessment & Plan Note (Signed)
Takes Tamsulosin. No urinary retention.    

## 2013-08-22 ENCOUNTER — Encounter: Payer: Self-pay | Admitting: Nurse Practitioner

## 2013-08-22 ENCOUNTER — Non-Acute Institutional Stay (SKILLED_NURSING_FACILITY): Payer: Medicare Other | Admitting: Nurse Practitioner

## 2013-08-22 DIAGNOSIS — I10 Essential (primary) hypertension: Secondary | ICD-10-CM

## 2013-08-22 DIAGNOSIS — D62 Acute posthemorrhagic anemia: Secondary | ICD-10-CM

## 2013-08-22 DIAGNOSIS — Z96659 Presence of unspecified artificial knee joint: Secondary | ICD-10-CM

## 2013-08-22 DIAGNOSIS — I739 Peripheral vascular disease, unspecified: Secondary | ICD-10-CM

## 2013-08-22 DIAGNOSIS — F4323 Adjustment disorder with mixed anxiety and depressed mood: Secondary | ICD-10-CM

## 2013-08-22 DIAGNOSIS — E039 Hypothyroidism, unspecified: Secondary | ICD-10-CM

## 2013-08-22 DIAGNOSIS — K5900 Constipation, unspecified: Secondary | ICD-10-CM

## 2013-08-22 DIAGNOSIS — N400 Enlarged prostate without lower urinary tract symptoms: Secondary | ICD-10-CM

## 2013-08-22 DIAGNOSIS — I2510 Atherosclerotic heart disease of native coronary artery without angina pectoris: Secondary | ICD-10-CM

## 2013-08-22 DIAGNOSIS — Z96652 Presence of left artificial knee joint: Secondary | ICD-10-CM

## 2013-08-22 NOTE — Assessment & Plan Note (Signed)
Per arterial US 07/04/13 diminished flow especially in the left common femoral artery and deep femoral artery areas. Risk reduction.     

## 2013-08-22 NOTE — Assessment & Plan Note (Signed)
Likely related to the recent surgery, relocation, post anesthesia, anemia. Pain--prn Lorazepam 0.5mg  q4hr prn available to him. May consider Zoloft is other symptoms develops in addition to sad facial looks.

## 2013-08-22 NOTE — Assessment & Plan Note (Signed)
Takes Fe 325mg  bid.  Improved Hgb 7.9 07/04/13--8.5 07/07/13-10.6 07/31/13. Improved.

## 2013-08-22 NOTE — Assessment & Plan Note (Signed)
Not adequate, increase Senna II bid  along with MiraLax dailly and prn Bisacodyl suppository prn.             

## 2013-08-22 NOTE — Assessment & Plan Note (Signed)
Takes Tamsulosin. No urinary retention.    

## 2013-08-22 NOTE — Assessment & Plan Note (Signed)
Takes Levothyroxine 50mcg, TSH 2.35 07/07/13               

## 2013-08-22 NOTE — Assessment & Plan Note (Signed)
S/p CABG >20 years. No c/o angina or chest pressures.

## 2013-08-22 NOTE — Assessment & Plan Note (Signed)
Controlled.  

## 2013-08-22 NOTE — Progress Notes (Signed)
Patient ID: William Roach, male   DOB: 1921-07-16, 79 y.o.   MRN: [id number]  No Known Allergies  Chief Complaint  Patient presents with  . Medical Managment of Chronic Issues    constipation    HPI: Patient is a 79 y.o. male seen in the SNF at [facility] today for evaluation of s/p left TKR, post op anemia, memory, anxiety/sad facial looks, left knee pain, constipation,  and other chronic medical conditions.   Problem List Items Addressed This Visit   Adjustment disorder with mixed anxiety and depressed mood     Likely related to the recent surgery, relocation, post anesthesia, anemia. Pain--prn Lorazepam 0.5mg  q4hr prn available to him. May consider Zoloft is other symptoms develops in addition to sad facial looks.       BPH (benign prostatic hyperplasia)     Takes Tamsulosin. No urinary retention.           CAD (coronary artery disease)     S/p CABG >20 years. No c/o angina or chest pressures.       HTN (hypertension)     Controlled.           PAD (peripheral artery disease)     Per arterial US 07/04/13 diminished flow especially in the left common femoral artery and deep femoral artery areas. Risk reduction.       Postoperative anemia due to acute blood loss     Takes Fe 325mg  bid.  Improved Hgb 7.9 07/04/13--8.5 07/07/13-10.6 07/31/13. Improved.             S/P knee replacement      ASA 325mg  bid subsequently. Left knee: 4 weeks postop. Pain is managed with prn Oxycodone. Swelling and erythema are resolving. WBAT.        Unspecified constipation     Not adequate, increase Senna II bid  along with MiraLax dailly and prn Bisacodyl suppository prn.              Unspecified hypothyroidism - Primary     Takes Levothyroxine , TSH 2.35 07/07/13             Review of Systems:  Review of Systems  Constitutional: Positive for weight loss. Negative for fever, chills, malaise/fatigue and diaphoresis.  HENT: Negative for  congestion, ear discharge, ear pain, hearing loss, nosebleeds, sore throat and tinnitus.   Eyes: Negative for blurred vision, double vision, photophobia, pain, discharge and redness.  Respiratory: Negative for cough, hemoptysis, sputum production, shortness of breath, wheezing and stridor.   Cardiovascular: Positive for leg swelling. Negative for chest pain, palpitations, orthopnea, claudication and PND.       Mild LLE  Gastrointestinal: Positive for constipation. Negative for heartburn, nausea, vomiting, abdominal pain, diarrhea, blood in stool and melena.  Genitourinary: Negative for dysuria, urgency, frequency, hematuria and flank pain.  Musculoskeletal: Positive for joint pain. Negative for back pain, falls, myalgias and neck pain.       Left knee  Skin: Negative for itching and rash.       The left knee surgical incision is healed.   Neurological: Positive for weakness. Negative for dizziness, tingling, tremors, sensory change, speech change, focal weakness, seizures, loss of consciousness and headaches.  Endo/Heme/Allergies: Negative for environmental allergies and polydipsia. Does not bruise/bleed easily.  Psychiatric/Behavioral: Positive for depression and memory loss. Negative for suicidal ideas, hallucinations and substance abuse. The patient is nervous/anxious. The patient does not have insomnia.  Sad facial looks.      Past Medical History  Diagnosis Date  . Coronary artery disease     post CABG x4   . Hypertension   . Hypercholesterolemia   . Fatigue   . Dizziness   . Basal cell carcinoma, ear     post -- partial pinnectomy left side with primary closure    . Hypothyroidism   . History of kidney stones     x1   Past Surgical History  Procedure Laterality Date  . Coronary artery bypass graft  2003    x4 --   . Cholecystectomy  2002  . Inguinal hernia repair  09/19/2007  . Ear cyst excision  12/14/2009    Excision of left auricular cyst with primary closure  .  Basal cell carcinoma excision  01/14/2010    Wedge resection, partial pinnectomy left side with primary closure      . Tonsillectomy    . Carpal tunnel release    . Cataract surgery Bilateral 06-24-13  . Tonsillectomy    . Cystoscopy    . Total knee arthroplasty Left 06/27/2013    Procedure: LEFT TOTAL KNEE ARTHROPLASTY;  Surgeon: Jacki Cones, MD;  Location: [facility];  Service: Orthopedics;  Laterality: Left;   Social History:   reports that he quit smoking about 32 years ago. He does not have any smokeless tobacco history on file. He reports that he does not drink alcohol or use illicit drugs.  Family History  Problem Relation Age of Onset  . Parkinsonism Father   . Angina Mother   . Coronary artery disease Brother     with CABG    Medications: Patient's Medications  New Prescriptions   No medications on file  Previous Medications   ACETAMINOPHEN (TYLENOL) 500 MG TABLET    Take 1,000 mg by mouth daily.   BISACODYL (DULCOLAX) 10 MG SUPPOSITORY    Place 1 suppository (10 mg total) rectally daily as needed.   FERROUS SULFATE 325 (65 FE) MG TABLET    Take 1 tablet (325 mg total) by mouth 3 (three) times daily after meals.   LEVOTHYROXINE (SYNTHROID, LEVOTHROID) 50 MCG TABLET    Take 50 mcg by mouth daily before breakfast.    METHOCARBAMOL (ROBAXIN) 500 MG TABLET    Take 1 tablet (500 mg total) by mouth every 6 (six) hours as needed.   OXYCODONE (OXY IR/ROXICODONE) 5 MG IMMEDIATE RELEASE TABLET    Take 1-2 tablets (5-10 mg total) by mouth every 3 (three) hours as needed.   POLYETHYLENE GLYCOL (MIRALAX / GLYCOLAX) PACKET    Take 17 g by mouth daily as needed.   SENNA (SENOKOT) 8.6 MG TABLET    Take 2 tablets by mouth.    TAMSULOSIN (FLOMAX) 0.4 MG CAPS CAPSULE    Take 1 capsule (0.4 mg total) by mouth daily.  Modified Medications   No medications on file  Discontinued Medications   RIVAROXABAN (XARELTO) 10 MG TABS TABLET    Take 1 tablet (10 mg total) by mouth daily with breakfast.       Physical Exam: Physical Exam  Constitutional: He is oriented to person, place, and time. He appears well-developed and well-nourished. No distress.  HENT:  Head: Normocephalic and atraumatic.  Right Ear: External ear normal.  Left Ear: External ear normal.  Nose: Nose normal.  Mouth/Throat: Oropharynx is clear and moist. No oropharyngeal exudate.  Eyes: Conjunctivae and EOM are normal. Pupils are equal, round, and reactive to light. Right  eye exhibits no discharge. Left eye exhibits no discharge. No scleral icterus.  Neck: Normal range of motion. Neck supple. No JVD present. No tracheal deviation present. No thyromegaly present.  Cardiovascular: Normal rate, regular rhythm, normal heart sounds and intact distal pulses.   No murmur heard. Pulmonary/Chest: Effort normal and breath sounds normal. No stridor. No respiratory distress. He has no wheezes. He has no rales. He exhibits no tenderness.  Abdominal: Soft. Bowel sounds are normal. He exhibits no distension. There is no tenderness. There is no rebound and no guarding.  Genitourinary: Penis normal.  Musculoskeletal: Normal range of motion. He exhibits edema and tenderness.   Left knee and LLE swelling, warmth, tenderness healing nicely.   Lymphadenopathy:    He has no cervical adenopathy.  Neurological: He is alert and oriented to person, place, and time. He has normal reflexes. No cranial nerve deficit. He exhibits normal muscle tone. Coordination normal.  Skin: Skin is warm and dry. No rash noted. He is not diaphoretic. There is erythema. No pallor.  The left knee surgical incision is healed.   Psychiatric: His behavior is normal. Judgment and thought content normal. His mood appears anxious. Cognition and memory are impaired. He exhibits abnormal recent memory.  Flat affect    Filed Vitals:   08/22/13 2045  BP: 134/70  Pulse: 88  Temp: 98.8 F (37.1 C)  TempSrc: Tympanic  Resp: 18      Labs reviewed: Basic  Metabolic Panel:  Recent Labs  16/10/96 1430 06/28/13 0541 06/29/13 0540 07/07/13  NA 136 136 135 138  K 4.9 4.1 3.7 3.8  CL 101 103 102  --   CO2 26 26 24   --   GLUCOSE 97 115* 121*  --   BUN 31* 19 22 20   CREATININE 0.98 0.83 0.81 0.7  CALCIUM 9.7 8.6 8.5  --   TSH  --   --   --  2.35   Liver Function Tests:  Recent Labs  04/09/13 1030 05/14/13 0927 06/24/13 1430 07/07/13  AST 83* 33 30 19  ALT 137* 40 22 19  ALKPHOS 76 71 87 114  BILITOT 1.0 1.1 0.6  --   PROT 6.6 6.3 6.7  --   ALBUMIN 4.1 3.9 3.9  --    CBC:  Recent Labs  06/29/13 0540 06/30/13 0425 06/30/13 1515 07/01/13 0428 07/04/13 07/07/13 07/31/13  WBC 12.9* 9.7  --  9.3 7.9 9.1 6.7  NEUTROABS  --   --   --  7.0  --   --   --   HGB 9.6* 8.3* 8.6* 8.1* 7.9* 8.5* 10.6*  HCT 28.2* 24.9* 24.9* 23.5* 24* 26* 34*  MCV 93.1 92.6  --  92.5  --   --   --   PLT 113* 92*  --  117* 274 409* 268   Lipid Panel:  Recent Labs  04/09/13 1030  CHOL 138  HDL 40.10  LDLCALC 69  TRIG 144.0  CHOLHDL 3    Past Procedures:  06/24/2013 History of CABG in 2003. Hypertension. EXAM: CHEST 2 VIEW COMPARISON: 01/14/2010 FINDINGS: The patient has had median sternotomy. IMPRESSION: No active cardiopulmonary disease.   06/27/2013 *RADIOLOGY REPORT* Clinical Data: Status post left total knee replacement. PORTABLE LEFT KNEE - 1-2 VIEW Comparison: 05/29/2013. Findings: The patient is status post left total knee arthroplasty. IMPRESSION: Interval left knee arthroplasty with expected postoperative findings.   07/04/13 LLE arterial Duplex US(ordered by Ella Bodo NP 07/03/13) PAD diminished flow especially in teh left  common femoral artery and deep femoral artery areas.   Assessment/Plan Unspecified hypothyroidism Takes Levothyroxine , TSH 2.35 07/07/13        Unspecified constipation Not adequate, increase Senna II bid  along with MiraLax dailly and prn Bisacodyl suppository prn.            S/P knee  replacement  ASA 325mg  bid subsequently. Left knee: 4 weeks postop. Pain is managed with prn Oxycodone. Swelling and erythema are resolving. WBAT.      Postoperative anemia due to acute blood loss Takes Fe 325mg  bid.  Improved Hgb 7.9 07/04/13--8.5 07/07/13-10.6 07/31/13. Improved.           PAD (peripheral artery disease) Per arterial US 07/04/13 diminished flow especially in the left common femoral artery and deep femoral artery areas. Risk reduction.     HTN (hypertension) Controlled.         CAD (coronary artery disease) S/p CABG >20 years. No c/o angina or chest pressures.     BPH (benign prostatic hyperplasia) Takes Tamsulosin. No urinary retention.         Adjustment disorder with mixed anxiety and depressed mood Likely related to the recent surgery, relocation, post anesthesia, anemia. Pain--prn Lorazepam 0.5mg  q4hr prn available to him. May consider Zoloft is other symptoms develops in addition to sad facial looks.       Family/ Staff Communication: observe the patient.   Goals of Care: IL. Here for rehab s/p left TKR  Labs/tests ordered: MMSE in 3 months.

## 2013-08-22 NOTE — Assessment & Plan Note (Signed)
ASA 325mg  bid subsequently. Left knee: 4 weeks postop. Pain is managed with prn Oxycodone. Swelling and erythema are resolving. WBAT.

## 2013-08-29 LAB — CBC AND DIFFERENTIAL
HCT: 45 % (ref 41–53)
Platelets: 192 10*3/uL (ref 150–399)
WBC: 7.3 10^3/mL

## 2013-09-09 ENCOUNTER — Non-Acute Institutional Stay (SKILLED_NURSING_FACILITY): Payer: Medicare Other | Admitting: Nurse Practitioner

## 2013-09-09 DIAGNOSIS — K5900 Constipation, unspecified: Secondary | ICD-10-CM

## 2013-09-09 DIAGNOSIS — E039 Hypothyroidism, unspecified: Secondary | ICD-10-CM

## 2013-09-09 DIAGNOSIS — F4323 Adjustment disorder with mixed anxiety and depressed mood: Secondary | ICD-10-CM

## 2013-09-09 DIAGNOSIS — R634 Abnormal weight loss: Secondary | ICD-10-CM

## 2013-09-09 DIAGNOSIS — D62 Acute posthemorrhagic anemia: Secondary | ICD-10-CM

## 2013-09-09 DIAGNOSIS — N400 Enlarged prostate without lower urinary tract symptoms: Secondary | ICD-10-CM

## 2013-09-09 DIAGNOSIS — I10 Essential (primary) hypertension: Secondary | ICD-10-CM

## 2013-09-10 NOTE — Progress Notes (Signed)
Patient ID: William Roach, male   DOB: September 30, 1921, 79 y.o.   MRN: [id number]   Code Status: DNR  No Known Allergies  Chief Complaint  Patient presents with  . Medical Managment of Chronic Issues    depression.   . Acute Visit    HPI: Patient is a 79 y.o. male seen in the SNF at [facility] today for evaluation of depression and other chronic medical conditions.  Problem List Items Addressed This Visit   HTN (hypertension)     Controlled. Update BMP            Acute blood loss anemia     dc Fe 325mg  bid.  Improved Hgb 7.9 07/04/13--8.5 07/07/13-10.6 07/31/13-14.3 08/29/13. F/u CBC in one month.             BPH (benign prostatic hyperplasia)     Takes Tamsulosin. No urinary retention.             Unspecified constipation     Not adequate, increase Senna II bid  along with MiraLax dailly and prn Bisacodyl suppository prn.                Unspecified hypothyroidism     Takes Levothyroxine , TSH 2.35 07/07/13          Adjustment disorder with mixed anxiety and depressed mood - Primary     Likely related to the recent surgery, relocation, post anesthesia, anemia. Pain--prn Lorazepam 0.5mg  q4hr prn available to him.Sad facial looks and lack of motives for Rehab--will try Mirtazapine 7.5mg  po qhs in setting of #6 Ibs weight lost(#153 Ibs prior to surgery and presently #147Ibs)        Loss of weight     Weight #153 Ibs ac surgery. Weekly weight showed a new plateau 08/11/13 #148.6, 08/17/13 #146.7, 08/24/13 #147, 09/2013 #147. The patient's admission weight was 160s-IVF peri-surgery, swollen leg(extremetly edematous from thigh to toes on admission to SNF), true weight lost about #5-6 Ibs due to the surgery. Will continue to monitor his weight and encourage oral intake.        Review of Systems:  Review of Systems  Constitutional: Negative for fever, chills, weight loss, malaise/fatigue and diaphoresis.  HENT: Negative for  congestion, ear discharge, ear pain, hearing loss, nosebleeds, sore throat and tinnitus.   Eyes: Negative for blurred vision, double vision, photophobia, pain, discharge and redness.  Respiratory: Negative for cough, hemoptysis, sputum production, shortness of breath, wheezing and stridor.   Cardiovascular: Positive for leg swelling. Negative for chest pain, palpitations, orthopnea, claudication and PND.       Mild LLE  Gastrointestinal: Positive for constipation. Negative for heartburn, nausea, vomiting, abdominal pain, diarrhea, blood in stool and melena.  Genitourinary: Negative for dysuria, urgency, frequency, hematuria and flank pain.  Musculoskeletal: Positive for joint pain. Negative for back pain, falls, myalgias and neck pain.       Left knee  Skin: Negative for itching and rash.       The left knee surgical incision is healed. Mild fever and erythema still presents  Neurological: Negative for dizziness, tingling, tremors, sensory change, speech change, focal weakness, seizures, loss of consciousness, weakness and headaches.  Endo/Heme/Allergies: Negative for environmental allergies and polydipsia. Does not bruise/bleed easily.  Psychiatric/Behavioral: Positive for depression and memory loss. Negative for suicidal ideas, hallucinations and substance abuse. The patient is nervous/anxious. The patient does not have insomnia.        Sad facial looks.  Past Medical History  Diagnosis Date  . Coronary artery disease     post CABG x4   . Hypertension   . Hypercholesterolemia   . Fatigue   . Dizziness   . Basal cell carcinoma, ear     post -- partial pinnectomy left side with primary closure    . Hypothyroidism   . History of kidney stones     x1   Past Surgical History  Procedure Laterality Date  . Coronary artery bypass graft  2003    x4 --   . Cholecystectomy  2002  . Inguinal hernia repair  09/19/2007  . Ear cyst excision  12/14/2009    Excision of left auricular cyst  with primary closure  . Basal cell carcinoma excision  01/14/2010    Wedge resection, partial pinnectomy left side with primary closure      . Tonsillectomy    . Carpal tunnel release    . Cataract surgery Bilateral 06-24-13  . Tonsillectomy    . Cystoscopy    . Total knee arthroplasty Left 06/27/2013    Procedure: LEFT TOTAL KNEE ARTHROPLASTY;  Surgeon: Jacki Cones, MD;  Location: [facility];  Service: Orthopedics;  Laterality: Left;   Social History:   reports that he quit smoking about 32 years ago. He does not have any smokeless tobacco history on file. He reports that he does not drink alcohol or use illicit drugs.  Family History  Problem Relation Age of Onset  . Parkinsonism Father   . Angina Mother   . Coronary artery disease Brother     with CABG    Medications: Patient's Medications  New Prescriptions   No medications on file  Previous Medications   ACETAMINOPHEN (TYLENOL) 500 MG TABLET    Take 1,000 mg by mouth daily.   BISACODYL (DULCOLAX) 10 MG SUPPOSITORY    Place 1 suppository (10 mg total) rectally daily as needed.   FERROUS SULFATE 325 (65 FE) MG TABLET    Take 1 tablet (325 mg total) by mouth 3 (three) times daily after meals.   LEVOTHYROXINE (SYNTHROID, LEVOTHROID) 50 MCG TABLET    Take 50 mcg by mouth daily before breakfast.    METHOCARBAMOL (ROBAXIN) 500 MG TABLET    Take 1 tablet (500 mg total) by mouth every 6 (six) hours as needed.   MIRTAZAPINE (REMERON) 7.5 MG TABLET    Take 7.5 mg by mouth at bedtime.   OXYCODONE (OXY IR/ROXICODONE) 5 MG IMMEDIATE RELEASE TABLET    Take 1-2 tablets (5-10 mg total) by mouth every 3 (three) hours as needed.   POLYETHYLENE GLYCOL (MIRALAX / GLYCOLAX) PACKET    Take 17 g by mouth daily as needed.   SENNA (SENOKOT) 8.6 MG TABLET    Take 2 tablets by mouth.    TAMSULOSIN (FLOMAX) 0.4 MG CAPS CAPSULE    Take 1 capsule (0.4 mg total) by mouth daily.  Modified Medications   No medications on file  Discontinued Medications   No  medications on file     Physical Exam: Physical Exam  Constitutional: He is oriented to person, place, and time. He appears well-developed and well-nourished. No distress.  HENT:  Head: Normocephalic and atraumatic.  Right Ear: External ear normal.  Left Ear: External ear normal.  Nose: Nose normal.  Mouth/Throat: Oropharynx is clear and moist. No oropharyngeal exudate.  Eyes: Conjunctivae and EOM are normal. Pupils are equal, round, and reactive to light. Right eye exhibits no discharge. Left eye exhibits no  discharge. No scleral icterus.  Neck: Normal range of motion. Neck supple. No JVD present. No tracheal deviation present. No thyromegaly present.  Cardiovascular: Normal rate, regular rhythm, normal heart sounds and intact distal pulses.   No murmur heard. Pulmonary/Chest: Effort normal. No stridor. No respiratory distress. He has no wheezes. He has rales. He exhibits no tenderness.  Dry rales bases.   Abdominal: Soft. Bowel sounds are normal. He exhibits no distension. There is no tenderness. There is no rebound and no guarding.  Genitourinary: Penis normal.  Musculoskeletal: Normal range of motion. He exhibits edema and tenderness.   Left knee and LLE swelling, warmth, tenderness healing nicely.   Lymphadenopathy:    He has no cervical adenopathy.  Neurological: He is alert and oriented to person, place, and time. He has normal reflexes. No cranial nerve deficit. He exhibits normal muscle tone. Coordination normal.  Skin: Skin is warm and dry. No rash noted. He is not diaphoretic. There is erythema. No pallor.  The left knee surgical incision is healed.   Psychiatric: His behavior is normal. Judgment and thought content normal. His mood appears anxious. Cognition and memory are impaired. He exhibits abnormal recent memory.  Flat affect    Filed Vitals:   09/11/13 2149  BP: 144/72  Pulse: 96  Temp: 97.9 F (36.6 C)  TempSrc: Tympanic  Resp: 18      Labs  reviewed: Basic Metabolic Panel:  Recent Labs  16/10/96 1430 06/28/13 0541 06/29/13 0540 07/07/13  NA 136 136 135 138  K 4.9 4.1 3.7 3.8  CL 101 103 102  --   CO2 26 26 24   --   GLUCOSE 97 115* 121*  --   BUN 31* 19 22 20   CREATININE 0.98 0.83 0.81 0.7  CALCIUM 9.7 8.6 8.5  --   TSH  --   --   --  2.35   Liver Function Tests:  Recent Labs  04/09/13 1030 05/14/13 0927 06/24/13 1430 07/07/13  AST 83* 33 30 19  ALT 137* 40 22 19  ALKPHOS 76 71 87 114  BILITOT 1.0 1.1 0.6  --   PROT 6.6 6.3 6.7  --   ALBUMIN 4.1 3.9 3.9  --    CBC:  Recent Labs  06/29/13 0540 06/30/13 0425 06/30/13 1515 07/01/13 0428  07/07/13 07/31/13 08/29/13  WBC 12.9* 9.7  --  9.3  < > 9.1 6.7 7.3  NEUTROABS  --   --   --  7.0  --   --   --   --   HGB 9.6* 8.3* 8.6* 8.1*  < > 8.5* 10.6* 14.3  HCT 28.2* 24.9* 24.9* 23.5*  < > 26* 34* 45  MCV 93.1 92.6  --  92.5  --   --   --   --   PLT 113* 92*  --  117*  < > 409* 268 192  < > = values in this interval not displayed. Lipid Panel:  Recent Labs  04/09/13 1030  CHOL 138  HDL 40.10  LDLCALC 69  TRIG 144.0  CHOLHDL 3   Assessment/Plan Adjustment disorder with mixed anxiety and depressed mood Likely related to the recent surgery, relocation, post anesthesia, anemia. Pain--prn Lorazepam 0.5mg  q4hr prn available to him.Sad facial looks and lack of motives for Rehab--will try Mirtazapine 7.5mg  po qhs in setting of #6 Ibs weight lost(#153 Ibs prior to surgery and presently #147Ibs)      Loss of weight Weight #153 Ibs ac surgery. Weekly weight  showed a new plateau 08/11/13 #148.6, 08/17/13 #146.7, 08/24/13 #147, 09/2013 #147. The patient's admission weight was 160s-IVF peri-surgery, swollen leg(extremetly edematous from thigh to toes on admission to SNF), true weight lost about #5-6 Ibs due to the surgery. Will continue to monitor his weight and encourage oral intake.   Acute blood loss anemia dc Fe 325mg  bid.  Improved Hgb 7.9 07/04/13--8.5  07/07/13-10.6 07/31/13-14.3 08/29/13. F/u CBC in one month.           HTN (hypertension) Controlled. Update BMP          Unspecified hypothyroidism Takes Levothyroxine , TSH 2.35 07/07/13        BPH (benign prostatic hyperplasia) Takes Tamsulosin. No urinary retention.           Unspecified constipation Not adequate, increase Senna II bid  along with MiraLax dailly and prn Bisacodyl suppository prn.                Family/ Staff Communication: observe the patient. Monitor weights.   Goals of Care: SNF  Labs/tests ordered: CBC and BMP in one month.

## 2013-09-10 NOTE — Assessment & Plan Note (Signed)
Likely related to the recent surgery, relocation, post anesthesia, anemia. Pain--prn Lorazepam 0.5mg  q4hr prn available to him.Sad facial looks and lack of motives for Rehab--will try Mirtazapine 7.5mg  po qhs in setting of #6 Ibs weight lost(#153 Ibs prior to surgery and presently #147Ibs)

## 2013-09-10 NOTE — Assessment & Plan Note (Signed)
Weight #153 Ibs ac surgery. Weekly weight showed a new plateau 08/11/13 #148.6, 08/17/13 #146.7, 08/24/13 #147, 09/2013 #147. The patient's admission weight was 160s-IVF peri-surgery, swollen leg(extremetly edematous from thigh to toes on admission to SNF), true weight lost about #5-6 Ibs due to the surgery. Will continue to monitor his weight and encourage oral intake.

## 2013-09-11 ENCOUNTER — Encounter: Payer: Self-pay | Admitting: Nurse Practitioner

## 2013-09-11 NOTE — Assessment & Plan Note (Signed)
Not adequate, increase Senna II bid  along with MiraLax dailly and prn Bisacodyl suppository prn.

## 2013-09-11 NOTE — Assessment & Plan Note (Signed)
dc Fe 325mg  bid.  Improved Hgb 7.9 07/04/13--8.5 07/07/13-10.6 07/31/13-14.3 08/29/13. F/u CBC in one month.

## 2013-09-11 NOTE — Assessment & Plan Note (Signed)
Takes Levothyroxine 50mcg, TSH 2.35 07/07/13               

## 2013-09-11 NOTE — Assessment & Plan Note (Addendum)
Controlled. Update BMP

## 2013-09-11 NOTE — Assessment & Plan Note (Signed)
Takes Tamsulosin. No urinary retention.    

## 2013-09-12 ENCOUNTER — Other Ambulatory Visit: Payer: Self-pay

## 2013-09-12 MED ORDER — OXYCODONE HCL 5 MG PO TABS: 5.0000 mg | ORAL_TABLET | ORAL | Status: AC | PRN

## 2013-10-07 ENCOUNTER — Non-Acute Institutional Stay (SKILLED_NURSING_FACILITY): Payer: Medicare Other | Admitting: Nurse Practitioner

## 2013-10-07 ENCOUNTER — Encounter: Payer: Self-pay | Admitting: Nurse Practitioner

## 2013-10-07 DIAGNOSIS — R42 Dizziness and giddiness: Secondary | ICD-10-CM

## 2013-10-07 DIAGNOSIS — H6120 Impacted cerumen, unspecified ear: Secondary | ICD-10-CM

## 2013-10-07 DIAGNOSIS — N400 Enlarged prostate without lower urinary tract symptoms: Secondary | ICD-10-CM

## 2013-10-07 DIAGNOSIS — I2510 Atherosclerotic heart disease of native coronary artery without angina pectoris: Secondary | ICD-10-CM

## 2013-10-07 DIAGNOSIS — E039 Hypothyroidism, unspecified: Secondary | ICD-10-CM

## 2013-10-07 DIAGNOSIS — H6123 Impacted cerumen, bilateral: Secondary | ICD-10-CM | POA: Insufficient documentation

## 2013-10-07 DIAGNOSIS — R634 Abnormal weight loss: Secondary | ICD-10-CM

## 2013-10-07 DIAGNOSIS — I10 Essential (primary) hypertension: Secondary | ICD-10-CM

## 2013-10-07 DIAGNOSIS — K5900 Constipation, unspecified: Secondary | ICD-10-CM

## 2013-10-07 HISTORY — DX: Dizziness and giddiness: R42

## 2013-10-07 NOTE — Assessment & Plan Note (Signed)
Weight #153 Ibs ac surgery. Weekly weight showed a new plateau 08/11/13 #148.6, 08/17/13 #146.7, 08/24/13 #147, 09/2013 #147. The patient's admission weight was 160s-IVF peri-surgery, swollen leg(extremetly edematous from thigh to toes on admission to SNF), true weight lost about #5-6 Ibs due to the surgery. Will continue to monitor his weight and encourage oral intake. Stable.

## 2013-10-07 NOTE — Assessment & Plan Note (Signed)
Controlled.  

## 2013-10-07 NOTE — Assessment & Plan Note (Signed)
S/p CABG >20 years. No c/o angina or chest pressures. Takes ASA   

## 2013-10-07 NOTE — Assessment & Plan Note (Signed)
Takes Tamsulosin. No urinary retention.    

## 2013-10-07 NOTE — Progress Notes (Signed)
Patient ID: William Roach, male   DOB: 06-Feb-1921, 80 y.o.   MRN: [id number]   Code Status: DNR  No Known Allergies  Chief Complaint  Patient presents with  . Medical Managment of Chronic Issues    dizziness x1  . Acute Visit    HPI: Patient is a 80 y.o. male seen in the SNF at [facility] today for evaluation of impacted cerumen, dizziness, loose stools, depression, and other chronic medical conditions.  Problem List Items Addressed This Visit   CAD (coronary artery disease)     S/p CABG >20 years. No c/o angina or chest pressures. Takes ASA        HTN (hypertension)     Controlled               BPH (benign prostatic hyperplasia)     Takes Tamsulosin. No urinary retention.               Unspecified constipation     Some loose stools, will decrease Senna II from bid to qd  along with MiraLax dailly and prn Bisacodyl suppository prn.                  Unspecified hypothyroidism     Takes Levothyroxine 77mcg, TSH 2.35 07/07/13            Loss of weight     Weight #153 Ibs ac surgery. Weekly weight showed a new plateau 08/11/13 #148.6, 08/17/13 #146.7, 08/24/13 #147, 09/2013 #147. The patient's admission weight was 160s-IVF peri-surgery, swollen leg(extremetly edematous from thigh to toes on admission to SNF), true weight lost about #5-6 Ibs due to the surgery. Will continue to monitor his weight and encourage oral intake. Stable.       Impacted cerumen of both ears - Primary     Standing order R+L ear lavage to remove ear wax.     Dizziness and giddiness     Episodes x1 this am, no focal neurological deficits noted, will check BP lying and standing to r/o orthostatic hypotension. May consider Neurology consultation if no better.        Review of Systems:  Review of Systems  Constitutional: Negative for fever, chills, weight loss, malaise/fatigue and diaphoresis.  HENT: Negative for congestion, ear discharge, ear pain,  hearing loss, nosebleeds, sore throat and tinnitus.   Eyes: Negative for blurred vision, double vision, photophobia, pain, discharge and redness.  Respiratory: Negative for cough, hemoptysis, sputum production, shortness of breath, wheezing and stridor.   Cardiovascular: Positive for leg swelling. Negative for chest pain, palpitations, orthopnea, claudication and PND.       Mild LLE  Gastrointestinal: Positive for constipation. Negative for heartburn, nausea, vomiting, abdominal pain, diarrhea, blood in stool and melena.       Loose stools  Genitourinary: Negative for dysuria, urgency, frequency, hematuria and flank pain.  Musculoskeletal: Positive for joint pain. Negative for back pain, falls, myalgias and neck pain.       Left knee-much better  Skin: Negative for itching and rash.       The left knee surgical incision is healed. Mild fever and erythema still presents  Neurological: Positive for dizziness. Negative for tingling, tremors, sensory change, speech change, focal weakness, seizures, loss of consciousness, weakness and headaches.  Endo/Heme/Allergies: Negative for environmental allergies and polydipsia. Does not bruise/bleed easily.  Psychiatric/Behavioral: Positive for depression and memory loss. Negative for suicidal ideas, hallucinations and substance abuse. The patient is nervous/anxious. The patient does not have  insomnia.        Sad facial looks.      Past Medical History  Diagnosis Date  . Coronary artery disease     post CABG x4   . Hypertension   . Hypercholesterolemia   . Fatigue   . Dizziness   . Basal cell carcinoma, ear     post -- partial pinnectomy left side with primary closure    . Hypothyroidism   . History of kidney stones     x1   Past Surgical History  Procedure Laterality Date  . Coronary artery bypass graft  2003    x4 --   . Cholecystectomy  2002  . Inguinal hernia repair  09/19/2007  . Ear cyst excision  12/14/2009    Excision of left  auricular cyst with primary closure  . Basal cell carcinoma excision  01/14/2010    Wedge resection, partial pinnectomy left side with primary closure      . Tonsillectomy    . Carpal tunnel release    . Cataract surgery Bilateral 06-24-13  . Tonsillectomy    . Cystoscopy    . Total knee arthroplasty Left 06/27/2013    Procedure: LEFT TOTAL KNEE ARTHROPLASTY;  Surgeon: Tobi Bastos, MD;  Location: [facility];  Service: Orthopedics;  Laterality: Left;   Social History:   reports that he quit smoking about 32 years ago. He does not have any smokeless tobacco history on file. He reports that he does not drink alcohol or use illicit drugs.  Family History  Problem Relation Age of Onset  . Parkinsonism Father   . Angina Mother   . Coronary artery disease Brother     with CABG    Medications: Patient's Medications  New Prescriptions   No medications on file  Previous Medications   BISACODYL (DULCOLAX) 10 MG SUPPOSITORY    Place 1 suppository (10 mg total) rectally daily as needed.   FERROUS SULFATE 325 (65 FE) MG TABLET    Take 1 tablet (325 mg total) by mouth 3 (three) times daily after meals.   LEVOTHYROXINE (SYNTHROID, LEVOTHROID) 50 MCG TABLET    Take 50 mcg by mouth daily before breakfast.    METHOCARBAMOL (ROBAXIN) 500 MG TABLET    Take 1 tablet (500 mg total) by mouth every 6 (six) hours as needed.   MIRTAZAPINE (REMERON) 7.5 MG TABLET    Take 7.5 mg by mouth at bedtime.   OXYCODONE (OXY IR/ROXICODONE) 5 MG IMMEDIATE RELEASE TABLET    Take 1-2 tablets (5-10 mg total) by mouth every 3 (three) hours as needed.   POLYETHYLENE GLYCOL (MIRALAX / GLYCOLAX) PACKET    Take 17 g by mouth daily as needed.   SENNA (SENOKOT) 8.6 MG TABLET    Take 2 tablets by mouth daily.    TAMSULOSIN (FLOMAX) 0.4 MG CAPS CAPSULE    Take 1 capsule (0.4 mg total) by mouth daily.  Modified Medications   No medications on file  Discontinued Medications   ACETAMINOPHEN (TYLENOL) 500 MG TABLET    Take 1,000 mg  by mouth daily.     Physical Exam: Physical Exam  Constitutional: He is oriented to person, place, and time. He appears well-developed and well-nourished. No distress.  HENT:  Head: Normocephalic and atraumatic.  Right Ear: External ear normal.  Left Ear: External ear normal.  Nose: Nose normal.  Mouth/Throat: Oropharynx is clear and moist. No oropharyngeal exudate.  Eyes: Conjunctivae and EOM are normal. Pupils are equal, round, and reactive  to light. Right eye exhibits no discharge. Left eye exhibits no discharge. No scleral icterus.  Neck: Normal range of motion. Neck supple. No JVD present. No tracheal deviation present. No thyromegaly present.  Cardiovascular: Normal rate, regular rhythm, normal heart sounds and intact distal pulses.   No murmur heard. Pulmonary/Chest: Effort normal. No stridor. No respiratory distress. He has no wheezes. He has rales. He exhibits no tenderness.  Dry rales bases.   Abdominal: Soft. Bowel sounds are normal. He exhibits no distension. There is no tenderness. There is no rebound and no guarding.  Genitourinary: Penis normal.  Musculoskeletal: Normal range of motion. He exhibits edema and tenderness.   Left knee mild swelling and warmth  Lymphadenopathy:    He has no cervical adenopathy.  Neurological: He is alert and oriented to person, place, and time. He has normal reflexes. No cranial nerve deficit. He exhibits normal muscle tone. Coordination normal.  Skin: Skin is warm and dry. No rash noted. He is not diaphoretic. There is erythema. No pallor.  The left knee surgical incision is healed. Mild erythema Left knee.   Psychiatric: His behavior is normal. Judgment and thought content normal. His mood appears anxious. Cognition and memory are impaired. He exhibits abnormal recent memory.  Flat affect-improved.     Filed Vitals:   10/07/13 1449  BP: 116/60  Pulse: 69  Temp: 96.3 F (35.7 C)  TempSrc: Tympanic  Resp: 19      Labs  reviewed: Basic Metabolic Panel:  Recent Labs  06/24/13 1430 06/28/13 0541 06/29/13 0540 07/07/13  NA 136 136 135 138  K 4.9 4.1 3.7 3.8  CL 101 103 102  --   CO2 26 26 24   --   GLUCOSE 97 115* 121*  --   BUN 31* 19 22 20   CREATININE 0.98 0.83 0.81 0.7  CALCIUM 9.7 8.6 8.5  --   TSH  --   --   --  2.35   Liver Function Tests:  Recent Labs  04/09/13 1030 05/14/13 0927 06/24/13 1430 07/07/13  AST 83* 33 30 19  ALT 137* 40 22 19  ALKPHOS 76 71 87 114  BILITOT 1.0 1.1 0.6  --   PROT 6.6 6.3 6.7  --   ALBUMIN 4.1 3.9 3.9  --    CBC:  Recent Labs  06/29/13 0540 06/30/13 0425 06/30/13 1515 07/01/13 0428  07/07/13 07/31/13 08/29/13  WBC 12.9* 9.7  --  9.3  < > 9.1 6.7 7.3  NEUTROABS  --   --   --  7.0  --   --   --   --   HGB 9.6* 8.3* 8.6* 8.1*  < > 8.5* 10.6* 14.3  HCT 28.2* 24.9* 24.9* 23.5*  < > 26* 34* 45  MCV 93.1 92.6  --  92.5  --   --   --   --   PLT 113* 92*  --  117*  < > 409* 268 192  < > = values in this interval not displayed. Lipid Panel:  Recent Labs  04/09/13 1030  CHOL 138  HDL 40.10  LDLCALC 69  TRIG 144.0  CHOLHDL 3   Assessment/Plan Impacted cerumen of both ears Standing order R+L ear lavage to remove ear wax.   Dizziness and giddiness Episodes x1 this am, no focal neurological deficits noted, will check BP lying and standing to r/o orthostatic hypotension. May consider Neurology consultation if no better.   CAD (coronary artery disease) S/p CABG >20 years. No  c/o angina or chest pressures. Takes ASA      HTN (hypertension) Controlled             Unspecified constipation Some loose stools, will decrease Senna II from bid to qd  along with MiraLax dailly and prn Bisacodyl suppository prn.                Unspecified hypothyroidism Takes Levothyroxine 39mcg, TSH 2.35 07/07/13          BPH (benign prostatic hyperplasia) Takes Tamsulosin. No urinary retention.             Loss of  weight Weight #153 Ibs ac surgery. Weekly weight showed a new plateau 08/11/13 #148.6, 08/17/13 #146.7, 08/24/13 #147, 09/2013 #147. The patient's admission weight was 160s-IVF peri-surgery, swollen leg(extremetly edematous from thigh to toes on admission to SNF), true weight lost about #5-6 Ibs due to the surgery. Will continue to monitor his weight and encourage oral intake. Stable.       Family/ Staff Communication: observe the patient. Monitor weights.   Goals of Care: SNF  Labs/tests ordered: BMP

## 2013-10-07 NOTE — Assessment & Plan Note (Signed)
Standing order R+L ear lavage to remove ear wax.

## 2013-10-07 NOTE — Assessment & Plan Note (Signed)
Takes Levothyroxine 50mcg, TSH 2.35 07/07/13               

## 2013-10-07 NOTE — Assessment & Plan Note (Signed)
Episodes x1 this am, no focal neurological deficits noted, will check BP lying and standing to r/o orthostatic hypotension. May consider Neurology consultation if no better.

## 2013-10-07 NOTE — Assessment & Plan Note (Signed)
Some loose stools, will decrease Senna II from bid to qd  along with MiraLax dailly and prn Bisacodyl suppository prn.                     

## 2013-10-13 LAB — CBC AND DIFFERENTIAL
HCT: 43 % (ref 41–53)
Hemoglobin: 14.6 g/dL (ref 13.5–17.5)
Platelets: 192 10*3/uL (ref 150–399)
WBC: 8 10*3/mL

## 2013-10-13 LAB — BASIC METABOLIC PANEL
BUN: 26 mg/dL — AB (ref 4–21)
Creatinine: 0.8 mg/dL (ref 0.6–1.3)
GLUCOSE: 89 mg/dL
POTASSIUM: 4.4 mmol/L (ref 3.4–5.3)
Sodium: 142 mmol/L (ref 137–147)

## 2013-10-17 ENCOUNTER — Non-Acute Institutional Stay (SKILLED_NURSING_FACILITY): Payer: Medicare Other | Admitting: Nurse Practitioner

## 2013-10-17 ENCOUNTER — Encounter: Payer: Self-pay | Admitting: Nurse Practitioner

## 2013-10-17 DIAGNOSIS — K5900 Constipation, unspecified: Secondary | ICD-10-CM

## 2013-10-17 DIAGNOSIS — F4321 Adjustment disorder with depressed mood: Secondary | ICD-10-CM | POA: Insufficient documentation

## 2013-10-17 DIAGNOSIS — Z96659 Presence of unspecified artificial knee joint: Secondary | ICD-10-CM

## 2013-10-17 DIAGNOSIS — N400 Enlarged prostate without lower urinary tract symptoms: Secondary | ICD-10-CM

## 2013-10-17 DIAGNOSIS — E039 Hypothyroidism, unspecified: Secondary | ICD-10-CM

## 2013-10-17 DIAGNOSIS — D62 Acute posthemorrhagic anemia: Secondary | ICD-10-CM

## 2013-10-17 DIAGNOSIS — I10 Essential (primary) hypertension: Secondary | ICD-10-CM

## 2013-10-17 DIAGNOSIS — I2510 Atherosclerotic heart disease of native coronary artery without angina pectoris: Secondary | ICD-10-CM

## 2013-10-17 HISTORY — DX: Adjustment disorder with depressed mood: F43.21

## 2013-10-17 NOTE — Assessment & Plan Note (Signed)
Controlled.  

## 2013-10-17 NOTE — Assessment & Plan Note (Signed)
Hx of anxiety and Ativan use. Responded to Mirtazapine 7.5mg well.    

## 2013-10-17 NOTE — Assessment & Plan Note (Addendum)
Left knee and LLE heat and mild erythema have no change since my last visit-OT indicated more heat/swelling in the left knee/calf. Will consult Ortho for recommendation. The patient denied increased pain or limitation of ROM-stated it has no change recently. Ice pk for now.

## 2013-10-17 NOTE — Assessment & Plan Note (Signed)
Resolved, off Iron, Hgb 14.6 10/13/13

## 2013-10-17 NOTE — Assessment & Plan Note (Signed)
Takes Levothyroxine 50mcg, TSH 2.35 07/07/13               

## 2013-10-17 NOTE — Assessment & Plan Note (Signed)
Takes Tamsulosin. No urinary retention.    

## 2013-10-17 NOTE — Progress Notes (Signed)
Patient ID: William Roach, male   DOB: [birth year], 80 y.o.   MRN: [id number]   Code Status: DNR  No Known Allergies  Chief Complaint  Patient presents with  . Medical Managment of Chronic Issues    the left  knee warmth  . Acute Visit    HPI: Patient is a 80 y.o. male seen in the SNF at [facility] today for evaluation of the left knee erythema/mild heat and other chronic medical conditions.  Problem List Items Addressed This Visit   BPH (benign prostatic hyperplasia)     Takes Tamsulosin. No urinary retention.                 CAD (coronary artery disease)     S/p CABG >20 years. No c/o angina or chest pressures. Takes ASA          HTN (hypertension)     Controlled                 Postoperative anemia due to acute blood loss     Resolved, off Iron, Hgb 14.6 10/13/13              S/P knee replacement     Left knee and LLE heat and mild erythema have no change since my last visit-OT indicated more heat/swelling in the left knee/calf. Will consult Ortho for recommendation. The patient denied increased pain or limitation of ROM-stated it has no change recently. Ice pk for now.     Situational depression     Hx of anxiety and Ativan use. Responded to Mirtazapine 7.5mg  well.     Unspecified constipation     Some loose stools, will decrease Senna II from bid to qd  along with MiraLax dailly and prn Bisacodyl suppository prn.                    Unspecified hypothyroidism - Primary     Takes Levothyroxine 67mcg, TSH 2.35 07/07/13                 Review of Systems:  Review of Systems  Constitutional: Negative for fever, chills, weight loss, malaise/fatigue and diaphoresis.  HENT: Negative for congestion, ear discharge, ear pain, hearing loss, nosebleeds, sore throat and tinnitus.   Eyes: Negative for blurred vision, double vision, photophobia, pain, discharge and redness.  Respiratory: Negative for cough,  hemoptysis, sputum production, shortness of breath, wheezing and stridor.   Cardiovascular: Positive for leg swelling. Negative for chest pain, palpitations, orthopnea, claudication and PND.       Mild LLE  Gastrointestinal: Positive for constipation. Negative for heartburn, nausea, vomiting, abdominal pain, diarrhea, blood in stool and melena.       Loose stools  Genitourinary: Negative for dysuria, urgency, frequency, hematuria and flank pain.  Musculoskeletal: Positive for joint pain. Negative for back pain, falls, myalgias and neck pain.       Left knee-much better  Skin: Negative for itching and rash.       The left knee surgical incision is healed. Mild fever and erythema still presents  Neurological: Positive for dizziness. Negative for tingling, tremors, sensory change, speech change, focal weakness, seizures, loss of consciousness, weakness and headaches.  Endo/Heme/Allergies: Negative for environmental allergies and polydipsia. Does not bruise/bleed easily.  Psychiatric/Behavioral: Positive for depression and memory loss. Negative for suicidal ideas, hallucinations and substance abuse. The patient is nervous/anxious. The patient does not have insomnia.        Sad facial looks.  Past Medical History  Diagnosis Date  . Coronary artery disease     post CABG x4   . Hypertension   . Hypercholesterolemia   . Fatigue   . Dizziness   . Basal cell carcinoma, ear     post -- partial pinnectomy left side with primary closure    . Hypothyroidism   . History of kidney stones     x1   Past Surgical History  Procedure Laterality Date  . Coronary artery bypass graft  2003    x4 --   . Cholecystectomy  2002  . Inguinal hernia repair  09/19/2007  . Ear cyst excision  12/14/2009    Excision of left auricular cyst with primary closure  . Basal cell carcinoma excision  01/14/2010    Wedge resection, partial pinnectomy left side with primary closure      . Tonsillectomy    . Carpal  tunnel release    . Cataract surgery Bilateral 06-24-13  . Tonsillectomy    . Cystoscopy    . Total knee arthroplasty Left 06/27/2013    Procedure: LEFT TOTAL KNEE ARTHROPLASTY;  Surgeon: Tobi Bastos, MD;  Location: [facility];  Service: Orthopedics;  Laterality: Left;   Social History:   reports that he quit smoking about 32 years ago. He does not have any smokeless tobacco history on file. He reports that he does not drink alcohol or use illicit drugs.  Family History  Problem Relation Age of Onset  . Parkinsonism Father   . Angina Mother   . Coronary artery disease Brother     with CABG    Medications: Patient's Medications  New Prescriptions   No medications on file  Previous Medications   BISACODYL (DULCOLAX) 10 MG SUPPOSITORY    Place 1 suppository (10 mg total) rectally daily as needed.   FERROUS SULFATE 325 (65 FE) MG TABLET    Take 1 tablet (325 mg total) by mouth 3 (three) times daily after meals.   LEVOTHYROXINE (SYNTHROID, LEVOTHROID) 50 MCG TABLET    Take 50 mcg by mouth daily before breakfast.    METHOCARBAMOL (ROBAXIN) 500 MG TABLET    Take 1 tablet (500 mg total) by mouth every 6 (six) hours as needed.   MIRTAZAPINE (REMERON) 7.5 MG TABLET    Take 7.5 mg by mouth at bedtime.   OXYCODONE (OXY IR/ROXICODONE) 5 MG IMMEDIATE RELEASE TABLET    Take 1-2 tablets (5-10 mg total) by mouth every 3 (three) hours as needed.   POLYETHYLENE GLYCOL (MIRALAX / GLYCOLAX) PACKET    Take 17 g by mouth daily as needed.   SENNA (SENOKOT) 8.6 MG TABLET    Take 2 tablets by mouth daily.    TAMSULOSIN (FLOMAX) 0.4 MG CAPS CAPSULE    Take 1 capsule (0.4 mg total) by mouth daily.  Modified Medications   No medications on file  Discontinued Medications   No medications on file     Physical Exam: Physical Exam  Constitutional: He is oriented to person, place, and time. He appears well-developed and well-nourished. No distress.  HENT:  Head: Normocephalic and atraumatic.  Right Ear:  External ear normal.  Left Ear: External ear normal.  Nose: Nose normal.  Mouth/Throat: Oropharynx is clear and moist. No oropharyngeal exudate.  Eyes: Conjunctivae and EOM are normal. Pupils are equal, round, and reactive to light. Right eye exhibits no discharge. Left eye exhibits no discharge. No scleral icterus.  Neck: Normal range of motion. Neck supple. No JVD present.  No tracheal deviation present. No thyromegaly present.  Cardiovascular: Normal rate, regular rhythm, normal heart sounds and intact distal pulses.   No murmur heard. Pulmonary/Chest: Effort normal. No stridor. No respiratory distress. He has no wheezes. He has rales. He exhibits no tenderness.  Dry rales bases.   Abdominal: Soft. Bowel sounds are normal. He exhibits no distension. There is no tenderness. There is no rebound and no guarding.  Genitourinary: Penis normal.  Musculoskeletal: Normal range of motion. He exhibits edema and tenderness.   Left knee mild swelling and warmth  Lymphadenopathy:    He has no cervical adenopathy.  Neurological: He is alert and oriented to person, place, and time. He has normal reflexes. No cranial nerve deficit. He exhibits normal muscle tone. Coordination normal.  Skin: Skin is warm and dry. No rash noted. He is not diaphoretic. There is erythema. No pallor.  The left knee surgical incision is healed. Mild erythema Left knee.   Psychiatric: His behavior is normal. Judgment and thought content normal. His mood appears anxious. Cognition and memory are impaired. He exhibits abnormal recent memory.  Flat affect-improved.     Filed Vitals:   10/17/13 1641  BP: 129/68  Pulse: 80  Temp: 97.7 F (36.5 C)  TempSrc: Tympanic  Resp: 20      Labs reviewed: Basic Metabolic Panel:  Recent Labs  06/24/13 1430 06/28/13 0541 06/29/13 0540 07/07/13 10/13/13  NA 136 136 135 138 142  K 4.9 4.1 3.7 3.8 4.4  CL 101 103 102  --   --   CO2 26 26 24   --   --   GLUCOSE 97 115* 121*  --    --   BUN 31* 19 22 20  26*  CREATININE 0.98 0.83 0.81 0.7 0.8  CALCIUM 9.7 8.6 8.5  --   --   TSH  --   --   --  2.35  --    Liver Function Tests:  Recent Labs  04/09/13 1030 05/14/13 0927 06/24/13 1430 07/07/13  AST 83* 33 30 19  ALT 137* 40 22 19  ALKPHOS 76 71 87 114  BILITOT 1.0 1.1 0.6  --   PROT 6.6 6.3 6.7  --   ALBUMIN 4.1 3.9 3.9  --    CBC:  Recent Labs  06/29/13 0540 06/30/13 0425 06/30/13 1515 07/01/13 0428  07/31/13 08/29/13 10/13/13  WBC 12.9* 9.7  --  9.3  < > 6.7 7.3 8.0  NEUTROABS  --   --   --  7.0  --   --   --   --   HGB 9.6* 8.3* 8.6* 8.1*  < > 10.6* 14.3 14.6  HCT 28.2* 24.9* 24.9* 23.5*  < > 34* 45 43  MCV 93.1 92.6  --  92.5  --   --   --   --   PLT 113* 92*  --  117*  < > 268 192 192  < > = values in this interval not displayed. Lipid Panel:  Recent Labs  04/09/13 1030  CHOL 138  HDL 40.10  LDLCALC 69  TRIG 144.0  CHOLHDL 3   Assessment/Plan Unspecified hypothyroidism Takes Levothyroxine 44mcg, TSH 2.35 07/07/13            Unspecified constipation Some loose stools, will decrease Senna II from bid to qd  along with MiraLax dailly and prn Bisacodyl suppository prn.                  S/P knee replacement  Left knee and LLE heat and mild erythema have no change since my last visit-OT indicated more heat/swelling in the left knee/calf. Will consult Ortho for recommendation. The patient denied increased pain or limitation of ROM-stated it has no change recently. Ice pk for now.   Postoperative anemia due to acute blood loss Resolved, off Iron, Hgb 14.6 10/13/13            HTN (hypertension) Controlled               CAD (coronary artery disease) S/p CABG >20 years. No c/o angina or chest pressures. Takes ASA        BPH (benign prostatic hyperplasia) Takes Tamsulosin. No urinary retention.               Situational depression Hx of anxiety and Ativan use. Responded to  Mirtazapine 7.5mg  well.     Family/ Staff Communication: observe the patient. Monitor weights.   Goals of Care: SNF  Labs/tests ordered: none

## 2013-10-17 NOTE — Assessment & Plan Note (Signed)
S/p CABG >20 years. No c/o angina or chest pressures. Takes ASA   

## 2013-10-17 NOTE — Assessment & Plan Note (Signed)
Some loose stools, will decrease Senna II from bid to qd  along with MiraLax dailly and prn Bisacodyl suppository prn.                     

## 2013-10-28 ENCOUNTER — Encounter: Payer: Self-pay | Admitting: Nurse Practitioner

## 2013-10-28 ENCOUNTER — Non-Acute Institutional Stay (SKILLED_NURSING_FACILITY): Payer: Medicare Other | Admitting: Nurse Practitioner

## 2013-10-28 DIAGNOSIS — K5900 Constipation, unspecified: Secondary | ICD-10-CM

## 2013-10-28 DIAGNOSIS — I10 Essential (primary) hypertension: Secondary | ICD-10-CM

## 2013-10-28 DIAGNOSIS — R509 Fever, unspecified: Secondary | ICD-10-CM

## 2013-10-28 DIAGNOSIS — F4321 Adjustment disorder with depressed mood: Secondary | ICD-10-CM

## 2013-10-28 DIAGNOSIS — E039 Hypothyroidism, unspecified: Secondary | ICD-10-CM

## 2013-10-28 DIAGNOSIS — N400 Enlarged prostate without lower urinary tract symptoms: Secondary | ICD-10-CM

## 2013-10-28 NOTE — Assessment & Plan Note (Signed)
Takes Levothyroxine 50mcg, TSH 2.35 07/07/13               

## 2013-10-28 NOTE — Assessment & Plan Note (Signed)
Some loose stools, will decrease Senna II from bid to qd  along with MiraLax dailly and prn Bisacodyl suppository prn.                     

## 2013-10-28 NOTE — Assessment & Plan Note (Signed)
Fever, malaise, slow mentation for 2 days-UA C/S pending, will obtain CBC, CMP, CXR to evaluate further.

## 2013-10-28 NOTE — Assessment & Plan Note (Signed)
Hx of anxiety and Ativan use. Responded to Mirtazapine 7.5mg  well.

## 2013-10-28 NOTE — Assessment & Plan Note (Signed)
Takes Tamsulosin. No urinary retention.    

## 2013-10-28 NOTE — Progress Notes (Signed)
Patient ID: William Roach, male   DOB: 18-Mar-1921, 80 y.o.   MRN: [id number]   Code Status: DNR  No Known Allergies  Chief Complaint  Patient presents with  . Medical Managment of Chronic Issues    fever 100.1 and generalized weakness and slow mentation.   . Acute Visit    HPI: Patient is a 80 y.o. male seen in the SNF at [facility] today for evaluation of low grade T, slow mentation, malaise, the left knee erythema/mild heat and other chronic medical conditions.  Problem List Items Addressed This Visit   BPH (benign prostatic hyperplasia)     Takes Tamsulosin. No urinary retention.                   Fever, unspecified - Primary     Fever, malaise, slow mentation for 2 days-UA C/S pending, will obtain CBC, CMP, CXR to evaluate further.     HTN (hypertension)     Controlled                   Situational depression     Hx of anxiety and Ativan use. Responded to Mirtazapine 7.5mg  well.       Unspecified constipation     Some loose stools, will decrease Senna II from bid to qd  along with MiraLax dailly and prn Bisacodyl suppository prn.                      Unspecified hypothyroidism     Takes Levothyroxine 26mcg, TSH 2.35 07/07/13                   Review of Systems:  Review of Systems  Constitutional: Positive for fever and malaise/fatigue. Negative for chills and weight loss.       Generalized weakness and slow mentation.   HENT: Negative for congestion, ear discharge, ear pain, hearing loss, nosebleeds, sore throat and tinnitus.   Eyes: Negative for blurred vision, double vision, photophobia, pain, discharge and redness.  Respiratory: Negative for cough, hemoptysis, sputum production, shortness of breath, wheezing and stridor.   Cardiovascular: Positive for leg swelling. Negative for chest pain, palpitations, orthopnea, claudication and PND.       Mild LLE  Gastrointestinal: Positive for constipation.  Negative for heartburn, nausea, vomiting, abdominal pain, diarrhea, blood in stool and melena.       Loose stools  Genitourinary: Negative for dysuria, urgency, frequency, hematuria and flank pain.  Musculoskeletal: Positive for joint pain. Negative for back pain, falls, myalgias and neck pain.       Left knee-much better  Skin: Negative for itching and rash.       The left knee surgical incision is healed. Mild fever and erythema still presents  Neurological: Positive for dizziness and weakness. Negative for tingling, tremors, sensory change, speech change, focal weakness, seizures, loss of consciousness and headaches.  Endo/Heme/Allergies: Negative for environmental allergies and polydipsia. Does not bruise/bleed easily.  Psychiatric/Behavioral: Positive for depression and memory loss. Negative for suicidal ideas, hallucinations and substance abuse. The patient is nervous/anxious. The patient does not have insomnia.        Sad facial looks.      Past Medical History  Diagnosis Date  . Coronary artery disease     post CABG x4   . Hypertension   . Hypercholesterolemia   . Fatigue   . Dizziness   . Basal cell carcinoma, ear     post -- partial  pinnectomy left side with primary closure    . Hypothyroidism   . History of kidney stones     x1   Past Surgical History  Procedure Laterality Date  . Coronary artery bypass graft  2003    x4 --   . Cholecystectomy  2002  . Inguinal hernia repair  09/19/2007  . Ear cyst excision  12/14/2009    Excision of left auricular cyst with primary closure  . Basal cell carcinoma excision  01/14/2010    Wedge resection, partial pinnectomy left side with primary closure      . Tonsillectomy    . Carpal tunnel release    . Cataract surgery Bilateral 06-24-13  . Tonsillectomy    . Cystoscopy    . Total knee arthroplasty Left 06/27/2013    Procedure: LEFT TOTAL KNEE ARTHROPLASTY;  Surgeon: Tobi Bastos, MD;  Location: [facility];  Service:  Orthopedics;  Laterality: Left;   Social History:   reports that he quit smoking about 32 years ago. He does not have any smokeless tobacco history on file. He reports that he does not drink alcohol or use illicit drugs.  Family History  Problem Relation Age of Onset  . Parkinsonism Father   . Angina Mother   . Coronary artery disease Brother     with CABG    Medications: Patient's Medications  New Prescriptions   No medications on file  Previous Medications   BISACODYL (DULCOLAX) 10 MG SUPPOSITORY    Place 1 suppository (10 mg total) rectally daily as needed.   FERROUS SULFATE 325 (65 FE) MG TABLET    Take 1 tablet (325 mg total) by mouth 3 (three) times daily after meals.   LEVOTHYROXINE (SYNTHROID, LEVOTHROID) 50 MCG TABLET    Take 50 mcg by mouth daily before breakfast.    METHOCARBAMOL (ROBAXIN) 500 MG TABLET    Take 1 tablet (500 mg total) by mouth every 6 (six) hours as needed.   MIRTAZAPINE (REMERON) 7.5 MG TABLET    Take 7.5 mg by mouth at bedtime.   OXYCODONE (OXY IR/ROXICODONE) 5 MG IMMEDIATE RELEASE TABLET    Take 1-2 tablets (5-10 mg total) by mouth every 3 (three) hours as needed.   POLYETHYLENE GLYCOL (MIRALAX / GLYCOLAX) PACKET    Take 17 g by mouth daily as needed.   SENNA (SENOKOT) 8.6 MG TABLET    Take 2 tablets by mouth daily.    TAMSULOSIN (FLOMAX) 0.4 MG CAPS CAPSULE    Take 1 capsule (0.4 mg total) by mouth daily.  Modified Medications   No medications on file  Discontinued Medications   No medications on file     Physical Exam: Physical Exam  Constitutional: He is oriented to person, place, and time. He appears well-developed and well-nourished. No distress.  Low grade T, slow mentation, generalized weakness.   HENT:  Head: Normocephalic and atraumatic.  Right Ear: External ear normal.  Left Ear: External ear normal.  Nose: Nose normal.  Mouth/Throat: Oropharynx is clear and moist. No oropharyngeal exudate.  Eyes: Conjunctivae and EOM are normal.  Pupils are equal, round, and reactive to light. Right eye exhibits no discharge. Left eye exhibits no discharge. No scleral icterus.  Neck: Normal range of motion. Neck supple. No JVD present. No tracheal deviation present. No thyromegaly present.  Cardiovascular: Normal rate, regular rhythm, normal heart sounds and intact distal pulses.   No murmur heard. Pulmonary/Chest: Effort normal. No stridor. No respiratory distress. He has no wheezes. He has  rales. He exhibits no tenderness.  Dry rales bases.   Abdominal: Soft. Bowel sounds are normal. He exhibits no distension. There is no tenderness. There is no rebound and no guarding.  Genitourinary: Penis normal.  Musculoskeletal: Normal range of motion. He exhibits edema and tenderness.   Left knee mild swelling and warmth  Lymphadenopathy:    He has no cervical adenopathy.  Neurological: He is alert and oriented to person, place, and time. He has normal reflexes. No cranial nerve deficit. He exhibits normal muscle tone. Coordination normal.  Skin: Skin is warm and dry. No rash noted. He is not diaphoretic. There is erythema. No pallor.  The left knee surgical incision is healed. Mild erythema Left knee.   Psychiatric: His behavior is normal. Judgment and thought content normal. His mood appears anxious. Cognition and memory are impaired. He exhibits abnormal recent memory.  Flat affect-improved.     Filed Vitals:   10/28/13 1012  BP: 120/60  Pulse: 84  Temp: 100.1 F (37.8 C)  TempSrc: Tympanic  Resp: 18      Labs reviewed: Basic Metabolic Panel:  Recent Labs  06/24/13 1430 06/28/13 0541 06/29/13 0540 07/07/13 10/13/13  NA 136 136 135 138 142  K 4.9 4.1 3.7 3.8 4.4  CL 101 103 102  --   --   CO2 26 26 24   --   --   GLUCOSE 97 115* 121*  --   --   BUN 31* 19 22 20  26*  CREATININE 0.98 0.83 0.81 0.7 0.8  CALCIUM 9.7 8.6 8.5  --   --   TSH  --   --   --  2.35  --    Liver Function Tests:  Recent Labs  04/09/13 1030  05/14/13 0927 06/24/13 1430 07/07/13  AST 83* 33 30 19  ALT 137* 40 22 19  ALKPHOS 76 71 87 114  BILITOT 1.0 1.1 0.6  --   PROT 6.6 6.3 6.7  --   ALBUMIN 4.1 3.9 3.9  --    CBC:  Recent Labs  06/29/13 0540 06/30/13 0425 06/30/13 1515 07/01/13 0428  07/31/13 08/29/13 10/13/13  WBC 12.9* 9.7  --  9.3  < > 6.7 7.3 8.0  NEUTROABS  --   --   --  7.0  --   --   --   --   HGB 9.6* 8.3* 8.6* 8.1*  < > 10.6* 14.3 14.6  HCT 28.2* 24.9* 24.9* 23.5*  < > 34* 45 43  MCV 93.1 92.6  --  92.5  --   --   --   --   PLT 113* 92*  --  117*  < > 268 192 192  < > = values in this interval not displayed. Lipid Panel:  Recent Labs  04/09/13 1030  CHOL 138  HDL 40.10  LDLCALC 69  TRIG 144.0  CHOLHDL 3   Assessment/Plan Fever, unspecified Fever, malaise, slow mentation for 2 days-UA C/S pending, will obtain CBC, CMP, CXR to evaluate further.   HTN (hypertension) Controlled                 BPH (benign prostatic hyperplasia) Takes Tamsulosin. No urinary retention.                 Unspecified hypothyroidism Takes Levothyroxine 11mcg, TSH 2.35 07/07/13              Situational depression Hx of anxiety and Ativan use. Responded to Mirtazapine 7.5mg  well.  Unspecified constipation Some loose stools, will decrease Senna II from bid to qd  along with MiraLax dailly and prn Bisacodyl suppository prn.                      Family/ Staff Communication: observe the patient. Monitor weights.   Goals of Care: SNF  Labs/tests ordered: CBC, CMP, UA C/S, CXR

## 2013-10-28 NOTE — Assessment & Plan Note (Signed)
Controlled.  

## 2013-10-29 LAB — BASIC METABOLIC PANEL
BUN: 19 mg/dL (ref 4–21)
CREATININE: 0.7 mg/dL (ref 0.6–1.3)
Glucose: 97 mg/dL
POTASSIUM: 3.9 mmol/L (ref 3.4–5.3)
Sodium: 138 mmol/L (ref 137–147)

## 2013-10-29 LAB — CBC AND DIFFERENTIAL
HCT: 38 % — AB (ref 41–53)
Hemoglobin: 12.8 g/dL — AB (ref 13.5–17.5)
PLATELETS: 199 10*3/uL (ref 150–399)
WBC: 11.7 10^3/mL

## 2013-10-29 LAB — HEPATIC FUNCTION PANEL
ALK PHOS: 98 U/L (ref 25–125)
ALT: 9 U/L — AB (ref 10–40)
AST: 14 U/L (ref 14–40)
Bilirubin, Total: 0.7 mg/dL

## 2013-10-31 ENCOUNTER — Encounter: Payer: Self-pay | Admitting: Nurse Practitioner

## 2013-10-31 ENCOUNTER — Non-Acute Institutional Stay (SKILLED_NURSING_FACILITY): Payer: Medicare Other | Admitting: Nurse Practitioner

## 2013-10-31 DIAGNOSIS — I10 Essential (primary) hypertension: Secondary | ICD-10-CM

## 2013-10-31 DIAGNOSIS — J189 Pneumonia, unspecified organism: Secondary | ICD-10-CM

## 2013-10-31 DIAGNOSIS — R200 Anesthesia of skin: Secondary | ICD-10-CM | POA: Insufficient documentation

## 2013-10-31 DIAGNOSIS — F4323 Adjustment disorder with mixed anxiety and depressed mood: Secondary | ICD-10-CM

## 2013-10-31 DIAGNOSIS — R209 Unspecified disturbances of skin sensation: Secondary | ICD-10-CM

## 2013-10-31 DIAGNOSIS — R509 Fever, unspecified: Secondary | ICD-10-CM

## 2013-10-31 DIAGNOSIS — K5900 Constipation, unspecified: Secondary | ICD-10-CM

## 2013-10-31 DIAGNOSIS — D62 Acute posthemorrhagic anemia: Secondary | ICD-10-CM

## 2013-10-31 DIAGNOSIS — E039 Hypothyroidism, unspecified: Secondary | ICD-10-CM

## 2013-10-31 DIAGNOSIS — N400 Enlarged prostate without lower urinary tract symptoms: Secondary | ICD-10-CM

## 2013-10-31 HISTORY — DX: Anesthesia of skin: R20.0

## 2013-10-31 NOTE — Assessment & Plan Note (Signed)
Post op, resolved, Hgb 12.8 10/29/13, off Fe.

## 2013-10-31 NOTE — Assessment & Plan Note (Signed)
Resolved after PNA identified and Levaquin started, UA unremarkable for UTI.

## 2013-10-31 NOTE — Assessment & Plan Note (Signed)
Takes Tamsulosin. No urinary retention.    

## 2013-10-31 NOTE — Assessment & Plan Note (Signed)
Some loose stools, will decrease Senna II from bid to qd  along with MiraLax dailly and prn Bisacodyl suppository prn.

## 2013-10-31 NOTE — Assessment & Plan Note (Addendum)
Fever, cough, lethargy presented, elevated wbc 11.7 10/29/13, CXR 10/28/13 identified patchy bibasilar pneumonitis--Levaquin 500mg  daily for total 10 days started 10/29/13-tolerated well. No fever today.

## 2013-10-31 NOTE — Progress Notes (Signed)
Patient ID: William Roach, male   DOB: 08/18/1921, 80 y.o.   MRN: [id number]   Code Status: DNR  No Known Allergies  Chief Complaint  Patient presents with  . Medical Managment of Chronic Issues    PNA, numbness in BLE/feet in am.   . Acute Visit    HPI: Patient is a 80 y.o. male seen in the SNF at [facility] today for evaluation of PNA, s/p left TKR with chronic mild erythema,  and other chronic medical conditions.  Problem List Items Addressed This Visit   Acute blood loss anemia     Post op, resolved, Hgb 12.8 10/29/13, off Fe.               Adjustment disorder with mixed anxiety and depressed mood     Hx of anxiety and Ativan use. Responded to Mirtazapine 7.5mg  well.         BPH (benign prostatic hyperplasia)     Takes Tamsulosin. No urinary retention.                   CAP (community acquired pneumonia) - Primary     Fever, cough, lethargy presented, elevated wbc 11.7 10/29/13, CXR 10/28/13 identified patchy bibasilar pneumonitis--Levaquin 500mg  daily for total 10 days started 10/29/13-tolerated well. No fever today.     Fever, unspecified     Resolved after PNA identified and Levaquin started, UA unremarkable for UTI.     HTN (hypertension)     Controlled                     Numbness in both legs     Legs/feet numbness in am only, denied pain or tingling sensation, unable to tell me how long has his BLE numbness been-? AR of Levaquin vs peripheral neuropathy--Neurology consult if no better.     Unspecified constipation     Some loose stools, will decrease Senna II from bid to qd  along with MiraLax dailly and prn Bisacodyl suppository prn.                        Unspecified hypothyroidism     Takes Levothyroxine 51mcg, TSH 2.35 07/07/13                     Review of Systems:  Review of Systems  Constitutional: Positive for malaise/fatigue. Negative for fever, chills and weight loss.       Generalized weakness and slow mentation-improving.   HENT: Negative for congestion, ear discharge, ear pain, hearing loss, nosebleeds, sore throat and tinnitus.   Eyes: Negative for blurred vision, double vision, photophobia, pain, discharge and redness.  Respiratory: Negative for cough, hemoptysis, sputum production, shortness of breath, wheezing and stridor.   Cardiovascular: Positive for leg swelling. Negative for chest pain, palpitations, orthopnea, claudication and PND.       Mild LLE  Gastrointestinal: Positive for constipation. Negative for heartburn, nausea, vomiting, abdominal pain, diarrhea, blood in stool and melena.       Loose stools  Genitourinary: Negative for dysuria, urgency, frequency, hematuria and flank pain.  Musculoskeletal: Positive for joint pain. Negative for back pain, falls, myalgias and neck pain.       Left knee-much better  Skin: Negative for itching and rash.       The left knee surgical incision is healed. Mild fever and erythema still presents  Neurological: Positive for sensory change and weakness. Negative for dizziness,  tingling, tremors, speech change, focal weakness, seizures, loss of consciousness and headaches.       Numbness in BLE/feet in am-better once he is up.   Endo/Heme/Allergies: Negative for environmental allergies and polydipsia. Does not bruise/bleed easily.  Psychiatric/Behavioral: Positive for depression and memory loss. Negative for suicidal ideas, hallucinations and substance abuse. The patient is nervous/anxious. The patient does not have insomnia.        Sad facial looks.      Past Medical History  Diagnosis Date  . Coronary artery disease     post CABG x4   . Hypertension   . Hypercholesterolemia   . Fatigue   . Dizziness   . Basal cell carcinoma, ear     post -- partial pinnectomy left side with primary closure    . Hypothyroidism   . History of kidney stones     x1   Past Surgical History  Procedure Laterality Date    . Coronary artery bypass graft  2003    x4 --   . Cholecystectomy  2002  . Inguinal hernia repair  09/19/2007  . Ear cyst excision  12/14/2009    Excision of left auricular cyst with primary closure  . Basal cell carcinoma excision  01/14/2010    Wedge resection, partial pinnectomy left side with primary closure      . Tonsillectomy    . Carpal tunnel release    . Cataract surgery Bilateral 06-24-13  . Tonsillectomy    . Cystoscopy    . Total knee arthroplasty Left 06/27/2013    Procedure: LEFT TOTAL KNEE ARTHROPLASTY;  Surgeon: Tobi Bastos, MD;  Location: [facility];  Service: Orthopedics;  Laterality: Left;   Social History:   reports that he quit smoking about 32 years ago. He does not have any smokeless tobacco history on file. He reports that he does not drink alcohol or use illicit drugs.  Family History  Problem Relation Age of Onset  . Parkinsonism Father   . Angina Mother   . Coronary artery disease Brother     with CABG    Medications: Patient's Medications  New Prescriptions   No medications on file  Previous Medications   BISACODYL (DULCOLAX) 10 MG SUPPOSITORY    Place 1 suppository (10 mg total) rectally daily as needed.   LEVOTHYROXINE (SYNTHROID, LEVOTHROID) 50 MCG TABLET    Take 50 mcg by mouth daily before breakfast.    METHOCARBAMOL (ROBAXIN) 500 MG TABLET    Take 1 tablet (500 mg total) by mouth every 6 (six) hours as needed.   MIRTAZAPINE (REMERON) 7.5 MG TABLET    Take 7.5 mg by mouth at bedtime.   OXYCODONE (OXY IR/ROXICODONE) 5 MG IMMEDIATE RELEASE TABLET    Take 1-2 tablets (5-10 mg total) by mouth every 3 (three) hours as needed.   POLYETHYLENE GLYCOL (MIRALAX / GLYCOLAX) PACKET    Take 17 g by mouth daily as needed.   SENNA (SENOKOT) 8.6 MG TABLET    Take 2 tablets by mouth daily.    TAMSULOSIN (FLOMAX) 0.4 MG CAPS CAPSULE    Take 1 capsule (0.4 mg total) by mouth daily.  Modified Medications   No medications on file  Discontinued Medications    FERROUS SULFATE 325 (65 FE) MG TABLET    Take 1 tablet (325 mg total) by mouth 3 (three) times daily after meals.     Physical Exam: Physical Exam  Constitutional: He is oriented to person, place, and time. He appears well-developed  and well-nourished. No distress.  Low grade T, slow mentation, generalized weakness.   HENT:  Head: Normocephalic and atraumatic.  Right Ear: External ear normal.  Left Ear: External ear normal.  Nose: Nose normal.  Mouth/Throat: Oropharynx is clear and moist. No oropharyngeal exudate.  Eyes: Conjunctivae and EOM are normal. Pupils are equal, round, and reactive to light. Right eye exhibits no discharge. Left eye exhibits no discharge. No scleral icterus.  Neck: Normal range of motion. Neck supple. No JVD present. No tracheal deviation present. No thyromegaly present.  Cardiovascular: Normal rate, regular rhythm, normal heart sounds and intact distal pulses.   No murmur heard. Pulmonary/Chest: Effort normal. No stridor. No respiratory distress. He has no wheezes. He has rales. He exhibits no tenderness.  Dry rales bases.   Abdominal: Soft. Bowel sounds are normal. He exhibits no distension. There is no tenderness. There is no rebound and no guarding.  Genitourinary: Penis normal.  Musculoskeletal: Normal range of motion. He exhibits edema and tenderness.   Left knee mild swelling and warmth  Lymphadenopathy:    He has no cervical adenopathy.  Neurological: He is alert and oriented to person, place, and time. He has normal reflexes. No cranial nerve deficit. He exhibits normal muscle tone. Coordination normal.  Skin: Skin is warm and dry. No rash noted. He is not diaphoretic. There is erythema. No pallor.  The left knee surgical incision is healed. Mild erythema Left knee.   Psychiatric: His behavior is normal. Judgment and thought content normal. His mood appears anxious. Cognition and memory are impaired. He exhibits abnormal recent memory.  Flat  affect-improved.     Filed Vitals:   10/31/13 1409  BP: 124/67  Pulse: 67  Temp: 98.9 F (37.2 C)  TempSrc: Tympanic  Resp: 18      Labs reviewed: Basic Metabolic Panel:  Recent Labs  06/24/13 1430 06/28/13 0541 06/29/13 0540 07/07/13 10/13/13 10/29/13  NA 136 136 135 138 142 138  K 4.9 4.1 3.7 3.8 4.4 3.9  CL 101 103 102  --   --   --   CO2 26 26 24   --   --   --   GLUCOSE 97 115* 121*  --   --   --   BUN 31* 19 22 20  26* 19  CREATININE 0.98 0.83 0.81 0.7 0.8 0.7  CALCIUM 9.7 8.6 8.5  --   --   --   TSH  --   --   --  2.35  --   --    Liver Function Tests:  Recent Labs  04/09/13 1030 05/14/13 0927 06/24/13 1430 07/07/13 10/29/13  AST 83* 33 30 19 14   ALT 137* 40 22 19 9*  ALKPHOS 76 71 87 114 98  BILITOT 1.0 1.1 0.6  --   --   PROT 6.6 6.3 6.7  --   --   ALBUMIN 4.1 3.9 3.9  --   --    CBC:  Recent Labs  06/29/13 0540 06/30/13 0425 06/30/13 1515 07/01/13 0428  08/29/13 10/13/13 10/29/13  WBC 12.9* 9.7  --  9.3  < > 7.3 8.0 11.7  NEUTROABS  --   --   --  7.0  --   --   --   --   HGB 9.6* 8.3* 8.6* 8.1*  < > 14.3 14.6 12.8*  HCT 28.2* 24.9* 24.9* 23.5*  < > 45 43 38*  MCV 93.1 92.6  --  92.5  --   --   --   --  PLT 113* 92*  --  117*  < > 192 192 199  < > = values in this interval not displayed. Lipid Panel:  Recent Labs  04/09/13 1030  CHOL 138  HDL 40.10  LDLCALC 69  TRIG 144.0  CHOLHDL 3   Past Procedures:  10/28/13 CXR minimal cardiomegaly wihtout pulmonary vascular congestion, no pleural effusion, patchy bibasilar pneumonitis.   Assessment/Plan CAP (community acquired pneumonia) Fever, cough, lethargy presented, elevated wbc 11.7 10/29/13, CXR 10/28/13 identified patchy bibasilar pneumonitis--Levaquin 500mg  daily for total 10 days started 10/29/13-tolerated well. No fever today.   Acute blood loss anemia Post op, resolved, Hgb 12.8 10/29/13, off Fe.             Adjustment disorder with mixed anxiety and depressed mood Hx  of anxiety and Ativan use. Responded to Mirtazapine 7.5mg  well.       BPH (benign prostatic hyperplasia) Takes Tamsulosin. No urinary retention.                 Fever, unspecified Resolved after PNA identified and Levaquin started, UA unremarkable for UTI.   HTN (hypertension) Controlled                   Unspecified constipation Some loose stools, will decrease Senna II from bid to qd  along with MiraLax dailly and prn Bisacodyl suppository prn.                      Unspecified hypothyroidism Takes Levothyroxine 66mcg, TSH 2.35 07/07/13                Numbness in both legs Legs/feet numbness in am only, denied pain or tingling sensation, unable to tell me how long has his BLE numbness been-? AR of Levaquin vs peripheral neuropathy--Neurology consult if no better.     Family/ Staff Communication: observe the patient. Monitor weights. Neurology consult.   Goals of Care: SNF  Labs/tests ordered: none

## 2013-10-31 NOTE — Assessment & Plan Note (Signed)
Legs/feet numbness in am only, denied pain or tingling sensation, unable to tell me how long has his BLE numbness been-? AR of Levaquin vs peripheral neuropathy--Neurology consult if no better.

## 2013-10-31 NOTE — Assessment & Plan Note (Signed)
Controlled.  

## 2013-10-31 NOTE — Assessment & Plan Note (Signed)
Hx of anxiety and Ativan use. Responded to Mirtazapine 7.5mg well.    

## 2013-10-31 NOTE — Assessment & Plan Note (Signed)
Takes Levothyroxine 68mcg, TSH 2.35 07/07/13

## 2013-11-07 ENCOUNTER — Non-Acute Institutional Stay (SKILLED_NURSING_FACILITY): Payer: Medicare Other | Admitting: Nurse Practitioner

## 2013-11-07 DIAGNOSIS — E039 Hypothyroidism, unspecified: Secondary | ICD-10-CM

## 2013-11-07 DIAGNOSIS — M25539 Pain in unspecified wrist: Secondary | ICD-10-CM

## 2013-11-07 DIAGNOSIS — F4321 Adjustment disorder with depressed mood: Secondary | ICD-10-CM

## 2013-11-07 DIAGNOSIS — N400 Enlarged prostate without lower urinary tract symptoms: Secondary | ICD-10-CM

## 2013-11-07 DIAGNOSIS — R279 Unspecified lack of coordination: Secondary | ICD-10-CM

## 2013-11-07 DIAGNOSIS — M25531 Pain in right wrist: Secondary | ICD-10-CM

## 2013-11-07 DIAGNOSIS — K5900 Constipation, unspecified: Secondary | ICD-10-CM

## 2013-11-07 DIAGNOSIS — I10 Essential (primary) hypertension: Secondary | ICD-10-CM

## 2013-11-08 ENCOUNTER — Encounter: Payer: Self-pay | Admitting: Nurse Practitioner

## 2013-11-08 DIAGNOSIS — M25531 Pain in right wrist: Secondary | ICD-10-CM | POA: Insufficient documentation

## 2013-11-08 DIAGNOSIS — R279 Unspecified lack of coordination: Secondary | ICD-10-CM | POA: Insufficient documentation

## 2013-11-08 HISTORY — DX: Unspecified lack of coordination: R27.9

## 2013-11-08 NOTE — Assessment & Plan Note (Signed)
Shuffling gait, c/o numbness in BLE, difficulty aiming his mouth when eats, son reported short lived slurred speech, no longer ambulates with walker(generalized weakness vs lack of coordination-muscle strength 5/5 x4 limbs), mild right facial droop. Neurology consult.

## 2013-11-08 NOTE — Progress Notes (Signed)
Patient ID: William Roach, male   DOB: [birth year], 80 y.o.   MRN: [id number]   Code Status: DNR  No Known Allergies  Chief Complaint  Patient presents with  . Medical Managment of Chronic Issues    right hand/wrist swelling, difficulty feeding self with right hand due to lack of coordination, anxiety  . Acute Visit    HPI: Patient is a 80 y.o. male seen in the SNF at [facility] today for evaluation of the right hand/wrist swelling and lack of coordination, anxiety, treated PNA, s/p left TKR with chronic mild erythema,  and other chronic medical conditions.  Problem List Items Addressed This Visit   BPH (benign prostatic hyperplasia)     Takes Tamsulosin. No urinary retention.                     HTN (hypertension)     Controlled                       Lack of coordination     Shuffling gait, c/o numbness in BLE, difficulty aiming his mouth when eats, son reported short lived slurred speech, no longer ambulates with walker(generalized weakness vs lack of coordination-muscle strength 5/5 x4 limbs), mild right facial droop. Neurology consult.     Right wrist pain - Primary     New, with mild warmth and tenderness, no reduced ROM, denied injury, will obtain X-ray of the right wrist and hand to r/o FX. Check uric acid, ESR, and RAF.     Situational depression     Hx of anxiety and Ativan use. Responded to Mirtazapine 7.64m until the recent PNA with Levaquin tx. Will increase Mirtazapine to 174m Levaquin and acute PNA may also contribute to his escalated anxiety/depression.         Relevant Medications      mirtazapine (REMERON) 15 MG tablet   Unspecified constipation     Some loose stools, will decrease Senna II from bid to qd  along with MiraLax dailly and prn Bisacodyl suppository prn.                          Unspecified hypothyroidism     Takes Levothyroxine 5057m TSH 2.35 07/07/13                       Review of Systems:  Review of Systems  Constitutional: Negative for fever, chills, weight loss and malaise/fatigue.       Generalized weakness-no longer ambulates with walker and difficulty transferring self  HENT: Negative for congestion, ear discharge, ear pain, hearing loss, nosebleeds, sore throat and tinnitus.   Eyes: Negative for blurred vision, double vision, photophobia, pain, discharge and redness.  Respiratory: Negative for cough, hemoptysis, sputum production, shortness of breath, wheezing and stridor.   Cardiovascular: Positive for leg swelling. Negative for chest pain, palpitations, orthopnea, claudication and PND.       Mild LLE. New the right hand/wrist  Gastrointestinal: Negative for heartburn, nausea, vomiting, abdominal pain, diarrhea, constipation, blood in stool and melena.  Genitourinary: Negative for dysuria, urgency, frequency, hematuria and flank pain.  Musculoskeletal: Positive for joint pain. Negative for back pain, falls, myalgias and neck pain.       Left knee-much better. New the right hand/wrist. Unable to transfer and ambulates with walker since the onset of PNA  Skin: Negative for itching and rash.  The left knee surgical incision is healed. Mild fever and erythema still presents  Neurological: Positive for sensory change and weakness. Negative for dizziness, tingling, tremors, speech change, focal weakness, seizures, loss of consciousness and headaches.       Numbness in BLE/feet in am-better once he is up. Difficulty ambulates with walker independently, new lack of coordination-miss his mojuth  Endo/Heme/Allergies: Negative for environmental allergies and polydipsia. Does not bruise/bleed easily.  Psychiatric/Behavioral: Positive for depression and memory loss. Negative for suicidal ideas, hallucinations and substance abuse. The patient is nervous/anxious. The patient does not have insomnia.        Sad facial looks.      Past Medical History   Diagnosis Date  . Coronary artery disease     post CABG x4   . Hypertension   . Hypercholesterolemia   . Fatigue   . Dizziness   . Basal cell carcinoma, ear     post -- partial pinnectomy left side with primary closure    . Hypothyroidism   . History of kidney stones     x1   Past Surgical History  Procedure Laterality Date  . Coronary artery bypass graft  2003    x4 --   . Cholecystectomy  2002  . Inguinal hernia repair  09/19/2007  . Ear cyst excision  12/14/2009    Excision of left auricular cyst with primary closure  . Basal cell carcinoma excision  01/14/2010    Wedge resection, partial pinnectomy left side with primary closure      . Tonsillectomy    . Carpal tunnel release    . Cataract surgery Bilateral 06-24-13  . Tonsillectomy    . Cystoscopy    . Total knee arthroplasty Left 06/27/2013    Procedure: LEFT TOTAL KNEE ARTHROPLASTY;  Surgeon: Tobi Bastos, MD;  Location: [facility];  Service: Orthopedics;  Laterality: Left;   Social History:   reports that he quit smoking about 32 years ago. He does not have any smokeless tobacco history on file. He reports that he does not drink alcohol or use illicit drugs.  Family History  Problem Relation Age of Onset  . Parkinsonism Father   . Angina Mother   . Coronary artery disease Brother     with CABG    Medications: Patient's Medications  New Prescriptions   No medications on file  Previous Medications   BISACODYL (DULCOLAX) 10 MG SUPPOSITORY    Place 1 suppository (10 mg total) rectally daily as needed.   LEVOTHYROXINE (SYNTHROID, LEVOTHROID) 50 MCG TABLET    Take 50 mcg by mouth daily before breakfast.    METHOCARBAMOL (ROBAXIN) 500 MG TABLET    Take 1 tablet (500 mg total) by mouth every 6 (six) hours as needed.   MIRTAZAPINE (REMERON) 15 MG TABLET    Take 15 mg by mouth at bedtime.   OXYCODONE (OXY IR/ROXICODONE) 5 MG IMMEDIATE RELEASE TABLET    Take 1-2 tablets (5-10 mg total) by mouth every 3 (three) hours as  needed.   POLYETHYLENE GLYCOL (MIRALAX / GLYCOLAX) PACKET    Take 17 g by mouth daily as needed.   SENNA (SENOKOT) 8.6 MG TABLET    Take 2 tablets by mouth daily.    TAMSULOSIN (FLOMAX) 0.4 MG CAPS CAPSULE    Take 1 capsule (0.4 mg total) by mouth daily.  Modified Medications   No medications on file  Discontinued Medications   MIRTAZAPINE (REMERON) 7.5 MG TABLET    Take 7.5 mg by  mouth at bedtime.     Physical Exam: Physical Exam  Constitutional: He is oriented to person, place, and time. He appears well-developed and well-nourished. No distress.  generalized weakness. No longer transferring self and ambulates with walker.   HENT:  Head: Normocephalic and atraumatic.  Right Ear: External ear normal.  Left Ear: External ear normal.  Nose: Nose normal.  Mouth/Throat: Oropharynx is clear and moist. No oropharyngeal exudate.  Eyes: Conjunctivae and EOM are normal. Pupils are equal, round, and reactive to light. Right eye exhibits no discharge. Left eye exhibits no discharge. No scleral icterus.  Neck: Normal range of motion. Neck supple. No JVD present. No tracheal deviation present. No thyromegaly present.  Cardiovascular: Normal rate, regular rhythm, normal heart sounds and intact distal pulses.   No murmur heard. Pulmonary/Chest: Effort normal. No stridor. No respiratory distress. He has no wheezes. He has rales. He exhibits no tenderness.  Dry rales bases.   Abdominal: Soft. Bowel sounds are normal. He exhibits no distension. There is no tenderness. There is no rebound and no guarding.  Genitourinary: Penis normal.  Musculoskeletal: Normal range of motion. He exhibits edema and tenderness.   Left knee mild swelling and warmth with limited flexion and extension ROM. The right wrist/hand swelling with reduced coordination-difficulty feeding self b/c troubling aiming his mouth.   Lymphadenopathy:    He has no cervical adenopathy.  Neurological: He is alert and oriented to person,  place, and time. He has normal reflexes. No cranial nerve deficit. He exhibits normal muscle tone. Coordination abnormal.  Decreased coordination of the RUE-difficulty aiming mouth with the right hand when eats. Flat affect. His son stated the patient has TIA like symptoms-short lived  slurred speech, memory lapses, and limb weakness.   Skin: Skin is warm and dry. No rash noted. He is not diaphoretic. There is erythema. No pallor.  The left knee surgical incision is healed. Mild erythema Left knee.   Psychiatric: His speech is normal. Judgment and thought content normal. His mood appears anxious. He is slowed and withdrawn. Cognition and memory are impaired. He exhibits abnormal recent memory.  Flat affect.     Filed Vitals:   11/07/13 1149  BP: 110/70  Pulse: 85  Temp: 97.4 F (36.3 C)  TempSrc: Tympanic  Resp: 18      Labs reviewed: Basic Metabolic Panel:  Recent Labs  06/24/13 1430 06/28/13 0541 06/29/13 0540 07/07/13 10/13/13 10/29/13  NA 136 136 135 138 142 138  K 4.9 4.1 3.7 3.8 4.4 3.9  CL 101 103 102  --   --   --   CO2 _0 --   --   --   GLUCOSE 97 115* 121*  --   --   --   BUN 31* _1 26* 19  CREATININE 0.98 0.83 0.81 0.7 0.8 0.7  CALCIUM 9.7 8.6 8.5  --   --   --   TSH  --   --   --  2.35  --   --    Liver Function Tests:  Recent Labs  04/09/13 1030 05/14/13 0927 06/24/13 1430 07/07/13 10/29/13  AST 83* 33 _2 ALT 137* 40 22 19 9*  ALKPHOS 76 71 87 114 98  BILITOT 1.0 1.1 0.6  --   --   PROT 6.6 6.3 6.7  --   --   ALBUMIN 4.1 3.9 3.9  --   --    CBC:  Recent Labs  06/29/13 0540 06/30/13 0425 06/30/13 1515 07/01/13 0428  08/29/13 10/13/13 10/29/13  WBC 12.9* 9.7  --  9.3  < > 7.3 8.0 11.7  NEUTROABS  --   --   --  7.0  --   --   --   --   HGB 9.6* 8.3* 8.6* 8.1*  < > 14.3 14.6 12.8*  HCT 28.2* 24.9* 24.9* 23.5*  < > 45 43 38*  MCV 93.1 92.6  --  92.5  --   --   --   --   PLT 113* 92*  --  117*  < > 192 192 199  < > = values  in this interval not displayed. Lipid Panel:  Recent Labs  04/09/13 1030  CHOL 138  HDL 40.10  LDLCALC 69  TRIG 144.0  CHOLHDL 3   Past Procedures:  10/28/13 CXR minimal cardiomegaly wihtout pulmonary vascular congestion, no pleural effusion, patchy bibasilar pneumonitis.   Assessment/Plan Right wrist pain New, with mild warmth and tenderness, no reduced ROM, denied injury, will obtain X-ray of the right wrist and hand to r/o FX. Check uric acid, ESR, and RAF.   Situational depression Hx of anxiety and Ativan use. Responded to Mirtazapine 7.40m until the recent PNA with Levaquin tx. Will increase Mirtazapine to 114m Levaquin and acute PNA may also contribute to his escalated anxiety/depression.       Lack of coordination Shuffling gait, c/o numbness in BLE, difficulty aiming his mouth when eats, son reported short lived slurred speech, no longer ambulates with walker(generalized weakness vs lack of coordination-muscle strength 5/5 x4 limbs), mild right facial droop. Neurology consult.   Unspecified hypothyroidism Takes Levothyroxine 5058m TSH 2.35 07/07/13                  BPH (benign prostatic hyperplasia) Takes Tamsulosin. No urinary retention.                   Unspecified constipation Some loose stools, will decrease Senna II from bid to qd  along with MiraLax dailly and prn Bisacodyl suppository prn.                        HTN (hypertension) Controlled                       Family/ Staff Communication: observe the patient. Monitor weights. Neurology consult.   Goals of Care: SNF  Labs/tests ordered: RAF, uric acid, ESR, X-ray The right hand/wrist

## 2013-11-08 NOTE — Assessment & Plan Note (Signed)
Takes Levothyroxine 50mcg, TSH 2.35 07/07/13               

## 2013-11-08 NOTE — Assessment & Plan Note (Signed)
Takes Tamsulosin. No urinary retention.    

## 2013-11-08 NOTE — Assessment & Plan Note (Signed)
Hx of anxiety and Ativan use. Responded to Mirtazapine 7.5mg  until the recent PNA with Levaquin tx. Will increase Mirtazapine to 15mg . Levaquin and acute PNA may also contribute to his escalated anxiety/depression.

## 2013-11-08 NOTE — Assessment & Plan Note (Signed)
Some loose stools, will decrease Senna II from bid to qd  along with MiraLax dailly and prn Bisacodyl suppository prn.                     

## 2013-11-08 NOTE — Assessment & Plan Note (Signed)
Controlled.  

## 2013-11-08 NOTE — Assessment & Plan Note (Signed)
New, with mild warmth and tenderness, no reduced ROM, denied injury, will obtain X-ray of the right wrist and hand to r/o FX. Check uric acid, ESR, and RAF.

## 2013-11-11 ENCOUNTER — Telehealth: Payer: Self-pay | Admitting: Neurology

## 2013-11-11 NOTE — Telephone Encounter (Signed)
Received 9 pages from [facility], sent to Dr. Wells Guiles Tat. 11/11/13/ss

## 2013-11-17 ENCOUNTER — Ambulatory Visit: Payer: BC Managed Care – PPO | Admitting: Neurology

## 2013-11-25 ENCOUNTER — Ambulatory Visit: Payer: BC Managed Care – PPO | Admitting: Neurology

## 2013-11-28 ENCOUNTER — Non-Acute Institutional Stay (SKILLED_NURSING_FACILITY): Payer: Medicare Other | Admitting: Nurse Practitioner

## 2013-11-28 ENCOUNTER — Encounter: Payer: Self-pay | Admitting: Nurse Practitioner

## 2013-11-28 DIAGNOSIS — F4321 Adjustment disorder with depressed mood: Secondary | ICD-10-CM

## 2013-11-28 DIAGNOSIS — R279 Unspecified lack of coordination: Secondary | ICD-10-CM

## 2013-11-28 DIAGNOSIS — I2510 Atherosclerotic heart disease of native coronary artery without angina pectoris: Secondary | ICD-10-CM

## 2013-11-28 DIAGNOSIS — E039 Hypothyroidism, unspecified: Secondary | ICD-10-CM

## 2013-11-28 DIAGNOSIS — R413 Other amnesia: Secondary | ICD-10-CM

## 2013-11-28 DIAGNOSIS — N400 Enlarged prostate without lower urinary tract symptoms: Secondary | ICD-10-CM

## 2013-11-28 DIAGNOSIS — J189 Pneumonia, unspecified organism: Secondary | ICD-10-CM

## 2013-11-28 DIAGNOSIS — D62 Acute posthemorrhagic anemia: Secondary | ICD-10-CM

## 2013-11-28 DIAGNOSIS — F4323 Adjustment disorder with mixed anxiety and depressed mood: Secondary | ICD-10-CM

## 2013-11-28 DIAGNOSIS — K5900 Constipation, unspecified: Secondary | ICD-10-CM

## 2013-11-28 DIAGNOSIS — R634 Abnormal weight loss: Secondary | ICD-10-CM

## 2013-11-28 NOTE — Progress Notes (Signed)
Patient ID: William Roach, male   DOB: 1921/09/24, 80 y.o.   MRN: [id number]   Code Status: DNR  No Known Allergies  Chief Complaint  Patient presents with  . Medical Managment of Chronic Issues    cognitive decline, increased weakness, ? prepheral vision loss    HPI: Patient is a 80 y.o. male seen in the SNF at [facility] today for evaluation of cognitive decline/?peripheral vision loss,  lack of coordination, anxiety/depression/wts, treated PNA, s/p left TKR with chronic mild erythema,  and other chronic medical conditions.  Problem List Items Addressed This Visit   Unspecified hypothyroidism     Takes Levothyroxine 51mcg, TSH 2.35 07/07/13. Update TSH      Unspecified constipation     Some loose stools, will decrease Senna II from bid to qd  along with MiraLax dailly and prn Bisacodyl suppository prn    Situational depression     Hx of anxiety and Ativan use. Responded to Mirtazapine 7.5mg  until the recent PNA with Levaquin tx. Continue with anxiety/flat affect with  Mirtazapine 15mg . Also weights: Feb #145, Jan #150, Dec #145. Dc Mirtazapine to 7.5mg  nightly x 1 week, then dc. Prn Ativan available to him-may schedule qpm for a 1 week then re-evaluate. Still has angry and anxious moments. May better manage his mood vs memory with mood stabilizer or memory preserving meds.      Memory deficit - Primary     MMSE 25/30, repeat memory test. May consider memory preserving medication. Worse in the past 2-3 weeks. PNA, Levaquin, increased Mirtazapine may also contribute to the condition. Taper of Mirtazapine. CT head with contrast since the PT reported possible peripheral vision loss. Update CBC, CMP, TSH next lab.      Loss of weight     Stabilized.     Lack of coordination     Shuffling gait, c/o numbness in BLE, difficulty aiming his mouth when eats, son reported short lived slurred speech, no longer ambulates with walker(generalized weakness vs lack of coordination-muscle  strength 5/5 x4 limbs), mild right facial droop. Neurology consult. Next Monday 12/01/13     CAP (community acquired pneumonia)     Fully treated and resolved fever, cough, lethargy presented, elevated wbc 11.7 10/29/13, CXR 10/28/13 identified patchy bibasilar pneumonitis--Levaquin 500mg  daily for total 10 days started 10/29/13-tolerated well. No fever today.      CAD (coronary artery disease)     S/p CABG >20 years. No c/o angina or chest pressures. Takes ASA      BPH (benign prostatic hyperplasia)     Takes Tamsulosin. No urinary retention.      Adjustment disorder with mixed anxiety and depressed mood     Hx of anxiety and Ativan use. Responded to Mirtazapine 7.5mg  until the recent PNA with Levaquin tx. Continue with anxiety/flat affect with  Mirtazapine 15mg . Also weights: Feb #145, Jan #150, Dec #145. Dc Mirtazapine to 7.5mg  nightly x 1 week, then dc.       Acute blood loss anemia     Hgb 12.8 10/29/13       Review of Systems:  Review of Systems  Constitutional: Negative for fever, chills, weight loss and malaise/fatigue.       Generalized weakness-no longer ambulates with walker and difficulty transferring self  HENT: Negative for congestion, ear discharge, ear pain, hearing loss, nosebleeds, sore throat and tinnitus.   Eyes: Negative for blurred vision, double vision, photophobia, pain, discharge and redness. ?periphearl vision loss Respiratory: Negative for cough,  hemoptysis, sputum production, shortness of breath, wheezing and stridor.   Cardiovascular: Positive for leg swelling. Negative for chest pain, palpitations, orthopnea, claudication and PND.       Mild LLE.  Gastrointestinal: Negative for heartburn, nausea, vomiting, abdominal pain, diarrhea, constipation, blood in stool and melena.  Genitourinary: Negative for dysuria, urgency, frequency, hematuria and flank pain.  Musculoskeletal: Positive for joint pain. Negative for back pain, falls, myalgias and neck pain.        Left knee-much better. New the right hand/wrist. Unable to transfer and ambulates with walker since the onset of PNA  Skin: Negative for itching and rash.       The left knee surgical incision is healed. Mild fever and erythema still presents  Neurological: Positive for sensory change and weakness. Negative for dizziness, tingling, tremors, speech change, focal weakness, seizures, loss of consciousness and headaches.       Numbness in BLE/feet in am-better once he is up. Difficulty ambulates with walker independently, new lack of coordination-miss his mojuth  Endo/Heme/Allergies: Negative for environmental allergies and polydipsia. Does not bruise/bleed easily.  Psychiatric/Behavioral: Positive for depression and memory loss. Negative for suicidal ideas, hallucinations and substance abuse. The patient is nervous/anxious. The patient does not have insomnia.        Sad facial looks.      Past Medical History  Diagnosis Date  . Coronary artery disease     post CABG x4   . Hypertension   . Hypercholesterolemia   . Fatigue   . Dizziness   . Basal cell carcinoma, ear     post -- partial pinnectomy left side with primary closure    . Hypothyroidism   . History of kidney stones     x1   Past Surgical History  Procedure Laterality Date  . Coronary artery bypass graft  2003    x4 --   . Cholecystectomy  2002  . Inguinal hernia repair  09/19/2007  . Ear cyst excision  12/14/2009    Excision of left auricular cyst with primary closure  . Basal cell carcinoma excision  01/14/2010    Wedge resection, partial pinnectomy left side with primary closure      . Tonsillectomy    . Carpal tunnel release    . Cataract surgery Bilateral 06-24-13  . Tonsillectomy    . Cystoscopy    . Total knee arthroplasty Left 06/27/2013    Procedure: LEFT TOTAL KNEE ARTHROPLASTY;  Surgeon: Jacki Cones, MD;  Location: [facility];  Service: Orthopedics;  Laterality: Left;   Social History:   reports that he  quit smoking about 32 years ago. He does not have any smokeless tobacco history on file. He reports that he does not drink alcohol or use illicit drugs.  Family History  Problem Relation Age of Onset  . Parkinsonism Father   . Angina Mother   . Coronary artery disease Brother     with CABG    Medications: Patient's Medications  New Prescriptions   No medications on file  Previous Medications   BISACODYL (DULCOLAX) 10 MG SUPPOSITORY    Place 1 suppository (10 mg total) rectally daily as needed.   LEVOTHYROXINE (SYNTHROID, LEVOTHROID) 50 MCG TABLET    Take 50 mcg by mouth daily before breakfast.    METHOCARBAMOL (ROBAXIN) 500 MG TABLET    Take 1 tablet (500 mg total) by mouth every 6 (six) hours as needed.   MIRTAZAPINE (REMERON) 15 MG TABLET    Take 15 mg  by mouth at bedtime.   OXYCODONE (OXY IR/ROXICODONE) 5 MG IMMEDIATE RELEASE TABLET    Take 1-2 tablets (5-10 mg total) by mouth every 3 (three) hours as needed.   POLYETHYLENE GLYCOL (MIRALAX / GLYCOLAX) PACKET    Take 17 g by mouth daily as needed.   SENNA (SENOKOT) 8.6 MG TABLET    Take 2 tablets by mouth daily.    TAMSULOSIN (FLOMAX) 0.4 MG CAPS CAPSULE    Take 1 capsule (0.4 mg total) by mouth daily.  Modified Medications   No medications on file  Discontinued Medications   No medications on file     Physical Exam: Physical Exam  Constitutional: He is oriented to person, place, and time. He appears well-developed and well-nourished. No distress.  generalized weakness. No longer transferring self and ambulates with walker.   HENT:  Head: Normocephalic and atraumatic.  Right Ear: External ear normal.  Left Ear: External ear normal.  Nose: Nose normal.  Mouth/Throat: Oropharynx is clear and moist. No oropharyngeal exudate.  Eyes: Conjunctivae and EOM are normal. Pupils are equal, round, and reactive to light. Right eye exhibits no discharge. Left eye exhibits no discharge. No scleral icterus.  Neck: Normal range of motion.  Neck supple. No JVD present. No tracheal deviation present. No thyromegaly present.  Cardiovascular: Normal rate, regular rhythm, normal heart sounds and intact distal pulses.   No murmur heard. Pulmonary/Chest: Effort normal. No stridor. No respiratory distress. He has no wheezes. He has rales. He exhibits no tenderness.  Dry rales bases.   Abdominal: Soft. Bowel sounds are normal. He exhibits no distension. There is no tenderness. There is no rebound and no guarding.  Genitourinary: Penis normal.  Musculoskeletal: Normal range of motion. He exhibits edema and tenderness.   Left knee mild swelling and warmth with limited flexion and extension ROM. The right wrist/hand has reduced coordination-difficulty feeding self b/c troubling aiming his mouth.   Lymphadenopathy:    He has no cervical adenopathy.  Neurological: He is alert and oriented to person, place, and time. He has normal reflexes. No cranial nerve deficit. He exhibits normal muscle tone. Coordination abnormal.  Decreased coordination of the RUE-difficulty aiming mouth with the right hand when eats. Flat affect. His son stated the patient has TIA like symptoms-short lived  slurred speech, memory lapses, and limb weakness.   Skin: Skin is warm and dry. No rash noted. He is not diaphoretic. There is erythema. No pallor.  The left knee surgical incision is healed. Mild erythema Left knee.   Psychiatric: His speech is normal. Judgment and thought content normal. His mood appears anxious. He is slowed and withdrawn. Cognition and memory are impaired. He exhibits abnormal recent memory.  Flat affect.     Filed Vitals:   11/28/13 1253  BP: 115/62  Pulse: 81  Temp: 97.6 F (36.4 C)  TempSrc: Tympanic  Resp: 20      Labs reviewed: Basic Metabolic Panel:  Recent Labs  06/24/13 1430 06/28/13 0541 06/29/13 0540 07/07/13 10/13/13 10/29/13  NA 136 136 135 138 142 138  K 4.9 4.1 3.7 3.8 4.4 3.9  CL 101 103 102  --   --   --   CO2  26 26 24   --   --   --   GLUCOSE 97 115* 121*  --   --   --   BUN 31* 19 22 20  26* 19  CREATININE 0.98 0.83 0.81 0.7 0.8 0.7  CALCIUM 9.7 8.6 8.5  --   --   --  TSH  --   --   --  2.35  --   --    Liver Function Tests:  Recent Labs  04/09/13 1030 05/14/13 0927 06/24/13 1430 07/07/13 10/29/13  AST 83* 33 30 19 14   ALT 137* 40 22 19 9*  ALKPHOS 76 71 87 114 98  BILITOT 1.0 1.1 0.6  --   --   PROT 6.6 6.3 6.7  --   --   ALBUMIN 4.1 3.9 3.9  --   --    CBC:  Recent Labs  06/29/13 0540 06/30/13 0425 06/30/13 1515 07/01/13 0428  08/29/13 10/13/13 10/29/13  WBC 12.9* 9.7  --  9.3  < > 7.3 8.0 11.7  NEUTROABS  --   --   --  7.0  --   --   --   --   HGB 9.6* 8.3* 8.6* 8.1*  < > 14.3 14.6 12.8*  HCT 28.2* 24.9* 24.9* 23.5*  < > 45 43 38*  MCV 93.1 92.6  --  92.5  --   --   --   --   PLT 113* 92*  --  117*  < > 192 192 199  < > = values in this interval not displayed. Lipid Panel:  Recent Labs  04/09/13 1030  CHOL 138  HDL 40.10  LDLCALC 69  TRIG 144.0  CHOLHDL 3   Past Procedures:  10/28/13 CXR minimal cardiomegaly wihtout pulmonary vascular congestion, no pleural effusion, patchy bibasilar pneumonitis.   Assessment/Plan Situational depression Hx of anxiety and Ativan use. Responded to Mirtazapine 7.5mg  until the recent PNA with Levaquin tx. Continue with anxiety/flat affect with  Mirtazapine 15mg . Also weights: Feb #145, Jan #150, Dec #145. Dc Mirtazapine to 7.5mg  nightly x 1 week, then dc. Prn Ativan available to him-may schedule qpm for a 1 week then re-evaluate. Still has angry and anxious moments. May better manage his mood vs memory with mood stabilizer or memory preserving meds.    Acute blood loss anemia Hgb 12.8 10/29/13  Adjustment disorder with mixed anxiety and depressed mood Hx of anxiety and Ativan use. Responded to Mirtazapine 7.5mg  until the recent PNA with Levaquin tx. Continue with anxiety/flat affect with  Mirtazapine 15mg . Also weights: Feb #145,  Jan #150, Dec #145. Dc Mirtazapine to 7.5mg  nightly x 1 week, then dc.     BPH (benign prostatic hyperplasia) Takes Tamsulosin. No urinary retention.    CAD (coronary artery disease) S/p CABG >20 years. No c/o angina or chest pressures. Takes ASA    CAP (community acquired pneumonia) Fully treated and resolved fever, cough, lethargy presented, elevated wbc 11.7 10/29/13, CXR 10/28/13 identified patchy bibasilar pneumonitis--Levaquin 500mg  daily for total 10 days started 10/29/13-tolerated well. No fever today.    Lack of coordination Shuffling gait, c/o numbness in BLE, difficulty aiming his mouth when eats, son reported short lived slurred speech, no longer ambulates with walker(generalized weakness vs lack of coordination-muscle strength 5/5 x4 limbs), mild right facial droop. Neurology consult. Next Monday 12/01/13   Loss of weight Stabilized.   Memory deficit MMSE 25/30, repeat memory test. May consider memory preserving medication. Worse in the past 2-3 weeks. PNA, Levaquin, increased Mirtazapine may also contribute to the condition. Taper of Mirtazapine. CT head with contrast since the PT reported possible peripheral vision loss. Update CBC, CMP, TSH next lab.    Unspecified hypothyroidism Takes Levothyroxine 60mcg, TSH 2.35 07/07/13. Update TSH    Unspecified constipation Some loose stools, will decrease Senna II from bid to  qd  along with MiraLax dailly and prn Bisacodyl suppository prn    Family/ Staff Communication: observe the patient. Monitor weights. Neurology consult.   Goals of Care: SNF  Labs/tests ordered: CBC, CMP, TSH, Head CT with contrast.

## 2013-11-28 NOTE — Assessment & Plan Note (Signed)
Fully treated and resolved fever, cough, lethargy presented, elevated wbc 11.7 10/29/13, CXR 10/28/13 identified patchy bibasilar pneumonitis--Levaquin 500mg  daily for total 10 days started 10/29/13-tolerated well. No fever today.

## 2013-11-28 NOTE — Assessment & Plan Note (Signed)
Stabilized.  

## 2013-11-28 NOTE — Assessment & Plan Note (Signed)
S/p CABG >20 years. No c/o angina or chest pressures. Takes ASA

## 2013-11-28 NOTE — Assessment & Plan Note (Addendum)
Hx of anxiety and Ativan use. Responded to Mirtazapine 7.5mg  until the recent PNA with Levaquin tx. Continue with anxiety/flat affect with  Mirtazapine 15mg . Also weights: Feb #145, Jan #150, Dec #145. Dc Mirtazapine to 7.5mg  nightly x 1 week, then dc. Prn Ativan available to him-may schedule qpm for a 1 week then re-evaluate. Still has angry and anxious moments. May better manage his mood vs memory with mood stabilizer or memory preserving meds.

## 2013-11-28 NOTE — Assessment & Plan Note (Signed)
Takes Levothyroxine 73mcg, TSH 2.35 07/07/13. Update TSH

## 2013-11-28 NOTE — Assessment & Plan Note (Addendum)
MMSE 25/30, repeat memory test. May consider memory preserving medication. Worse in the past 2-3 weeks. PNA, Levaquin, increased Mirtazapine may also contribute to the condition. Taper of Mirtazapine. CT head with contrast since the PT reported possible peripheral vision loss. Update CBC, CMP, TSH next lab.

## 2013-11-28 NOTE — Assessment & Plan Note (Signed)
Some loose stools, will decrease Senna II from bid to qd  along with MiraLax dailly and prn Bisacodyl suppository prn.                     

## 2013-11-28 NOTE — Assessment & Plan Note (Signed)
Shuffling gait, c/o numbness in BLE, difficulty aiming his mouth when eats, son reported short lived slurred speech, no longer ambulates with walker(generalized weakness vs lack of coordination-muscle strength 5/5 x4 limbs), mild right facial droop. Neurology consult. Next Monday 12/01/13

## 2013-11-28 NOTE — Assessment & Plan Note (Signed)
Hx of anxiety and Ativan use. Responded to Mirtazapine 7.5mg  until the recent PNA with Levaquin tx. Continue with anxiety/flat affect with  Mirtazapine 15mg . Also weights: Feb #145, Jan #150, Dec #145. Dc Mirtazapine to 7.5mg  nightly x 1 week, then dc.

## 2013-11-28 NOTE — Assessment & Plan Note (Signed)
Takes Tamsulosin. No urinary retention.    

## 2013-11-28 NOTE — Assessment & Plan Note (Signed)
Hgb 12.8 10/29/13

## 2013-12-01 ENCOUNTER — Other Ambulatory Visit: Payer: Self-pay | Admitting: Internal Medicine

## 2013-12-01 ENCOUNTER — Ambulatory Visit (INDEPENDENT_AMBULATORY_CARE_PROVIDER_SITE_OTHER): Payer: Medicare Other | Admitting: Neurology

## 2013-12-01 ENCOUNTER — Encounter: Payer: Self-pay | Admitting: Neurology

## 2013-12-01 VITALS — BP 115/60 | HR 74 | Temp 99.2°F | Resp 18

## 2013-12-01 DIAGNOSIS — R5381 Other malaise: Secondary | ICD-10-CM

## 2013-12-01 DIAGNOSIS — G609 Hereditary and idiopathic neuropathy, unspecified: Secondary | ICD-10-CM

## 2013-12-01 DIAGNOSIS — R531 Weakness: Secondary | ICD-10-CM | POA: Insufficient documentation

## 2013-12-01 DIAGNOSIS — I2510 Atherosclerotic heart disease of native coronary artery without angina pectoris: Secondary | ICD-10-CM

## 2013-12-01 DIAGNOSIS — H53459 Other localized visual field defect, unspecified eye: Secondary | ICD-10-CM

## 2013-12-01 DIAGNOSIS — R5383 Other fatigue: Secondary | ICD-10-CM

## 2013-12-01 DIAGNOSIS — R413 Other amnesia: Secondary | ICD-10-CM

## 2013-12-01 HISTORY — DX: Weakness: R53.1

## 2013-12-01 LAB — HEPATIC FUNCTION PANEL
ALT: 9 U/L — AB (ref 10–40)
AST: 13 U/L — AB (ref 14–40)
Alkaline Phosphatase: 149 U/L — AB (ref 25–125)
Bilirubin, Total: 0.7 mg/dL

## 2013-12-01 LAB — BASIC METABOLIC PANEL
BUN: 29 mg/dL — AB (ref 4–21)
Creatinine: 0.7 mg/dL (ref 0.6–1.3)
Glucose: 86 mg/dL
Potassium: 4.3 mmol/L (ref 3.4–5.3)
Sodium: 141 mmol/L (ref 137–147)

## 2013-12-01 LAB — CBC AND DIFFERENTIAL
HCT: 34 % — AB (ref 41–53)
HEMOGLOBIN: 11.5 g/dL — AB (ref 13.5–17.5)
Platelets: 327 10*3/uL (ref 150–399)
WBC: 10.8 10*3/mL

## 2013-12-01 NOTE — Progress Notes (Signed)
NEUROLOGY CONSULTATION NOTE  PHILIP KOTLYAR MRN: [id number] DOB: [birth year]  Referring provider: Dr. Jeanmarie Hubert Primary care provider: Dr. Donnie Coffin  Reason for consult:  Bilateral lower extremity/feet numbness, possible visual disturbance, cognitive decline  Dear Dr Nyoka Cowden:  Thank you for your kind referral of MAXIMILLIANO KERSH for consultation of the above symptoms. Although his history is well known to you, please allow me to reiterate it for the purpose of our medical record. He is a poor historian.  Staff accompanying him today is unaware of his history, records from SNF were reviewed.  HISTORY OF PRESENT ILLNESS: This is a 80 year old left-handed man with a history of CAD s/p CABG, hypertension, hyperlipidemia, hypothyroidism, osteoarthritis, presenting for evaluation of multiple neurological symptoms, including bilateral lower extremity/feet numbness, possible visual disturbance, and cognitive decline.  As noted above, he is a poor historian, but is able to tell me about his left knee surgery and current symptoms.  He currently denies any numbness or tingling in both lower extremities.  He states that "they are not numb, it is just a strength problem," and reports that he has no strength in his limbs anymore.  He cannot lift his arms over his head.  He reports that he is able to feed himself, but his "mouth does not follow where the hand is."  He denies any numbness/tingling in his arms.  He feels his "balance is okay, but I have lost equilibrium."  When asked to clarify, her reports dizziness with a floating sensation.  He denies any headaches, diplopia, dysarthria, dysphagia.  He tells me about right shoulder pain today.  He states that he can still read books.    After his visit, I spoke to SNF nurse Elsie Ra on the phone, concern is for cognitive and physical decline since the holidays.  He was doing very well with therapy before the holidays, walking and dressing himself,  however after the holidays started complaining of back pain, and was unable to do his prior activities.  He has also become very forgetful.  Notes from physical therapy were reviewed, with note of confusion on how to stand, patient reporting inability to lift right LE from floor, then walking 8 feet.  He did not see the chair right in front of him.  He was noted to be ashen, with worsening confusion during the session. It was also noted that if he is spoken to for a short amount of time, he may appear coherent, but with longer conversation or when asked to complete a task, he is unable to do so.  Records and images were personally reviewed where available.    Laboratory Data: Labs from 11/10/2013: ESR 45, RF 11, uric acid 4.8. 12/01/13: CBC, CMP unremarkable; TSH 1.761 (nl)  PAST MEDICAL HISTORY: Past Medical History  Diagnosis Date  . Coronary artery disease     post CABG x4   . Hypertension   . Hypercholesterolemia   . Fatigue   . Dizziness   . Basal cell carcinoma, ear     post -- partial pinnectomy left side with primary closure    . Hypothyroidism   . History of kidney stones     x1    PAST SURGICAL HISTORY: Past Surgical History  Procedure Laterality Date  . Coronary artery bypass graft  2003    x4 --   . Cholecystectomy  2002  . Inguinal hernia repair  09/19/2007  . Ear cyst excision  12/14/2009  Excision of left auricular cyst with primary closure  . Basal cell carcinoma excision  01/14/2010    Wedge resection, partial pinnectomy left side with primary closure      . Tonsillectomy    . Carpal tunnel release    . Cataract surgery Bilateral 06-24-13  . Tonsillectomy    . Cystoscopy    . Total knee arthroplasty Left 06/27/2013    Procedure: LEFT TOTAL KNEE ARTHROPLASTY;  Surgeon: Tobi Bastos, MD;  Location: [facility];  Service: Orthopedics;  Laterality: Left;    MEDICATIONS: Current Outpatient Prescriptions on File Prior to Visit  Medication Sig Dispense Refill  .  bisacodyl (DULCOLAX) 10 MG suppository Place 1 suppository (10 mg total) rectally daily as needed.  12 suppository  0  . levothyroxine (SYNTHROID, LEVOTHROID) 50 MCG tablet Take 50 mcg by mouth daily before breakfast.       . methocarbamol (ROBAXIN) 500 MG tablet Take 1 tablet (500 mg total) by mouth every 6 (six) hours as needed.  40 tablet  1  . mirtazapine (REMERON) 15 MG tablet Take 15 mg by mouth at bedtime.      Marland Kitchen oxyCODONE (OXY IR/ROXICODONE) 5 MG immediate release tablet Take 1-2 tablets (5-10 mg total) by mouth every 3 (three) hours as needed.  240 tablet  0  . polyethylene glycol (MIRALAX / GLYCOLAX) packet Take 17 g by mouth daily as needed.  10 each  0  . senna (SENOKOT) 8.6 MG tablet Take 2 tablets by mouth daily.       . tamsulosin (FLOMAX) 0.4 MG CAPS capsule Take 1 capsule (0.4 mg total) by mouth daily.  10 capsule  0   No current facility-administered medications on file prior to visit.    ALLERGIES: No Known Allergies  FAMILY HISTORY: Family History  Problem Relation Age of Onset  . Parkinsonism Father   . Angina Mother   . Coronary artery disease Brother     with CABG    SOCIAL HISTORY: History   Social History  . Marital Status: Widowed    Spouse Name: N/A    Number of Children: N/A  . Years of Education: N/A   Occupational History  . Not on file.   Social History Main Topics  . Smoking status: Former Smoker    Quit date: 06/21/1981  . Smokeless tobacco: Not on file  . Alcohol Use: No  . Drug Use: No  . Sexual Activity: No   Other Topics Concern  . Not on file   Social History Narrative  . No narrative on file    REVIEW OF SYSTEMS: Constitutional: No fevers, chills, or sweats, no generalized fatigue, change in appetite Eyes: as above Ear, nose and throat: + hearing loss, no ear pain, nasal congestion, sore throat Cardiovascular: No chest pain, palpitations Respiratory:  No shortness of breath at rest or with exertion,  wheezes GastrointestinaI: No nausea, vomiting, diarrhea, abdominal pain, fecal incontinence Genitourinary:  No dysuria, urinary retention or frequency Musculoskeletal:  + neck pain, back pain, right shoulder and left knee pain Integumentary: No rash, pruritus, skin lesions Neurological: as above Psychiatric: No depression, insomnia, anxiety Endocrine: No palpitations, fatigue, diaphoresis, mood swings, change in appetite, change in weight, increased thirst Hematologic/Lymphatic:  No anemia, purpura, petechiae. Allergic/Immunologic: no itchy/runny eyes, nasal congestion, recent allergic reactions, rashes  PHYSICAL EXAM: Filed Vitals:   12/01/13 1014  BP: 115/60  Pulse: 74  Temp: 99.2 F (37.3 C)  Resp: 18   General: No acute distress, flat  affect with somewhat masked facies  Head:  Normocephalic/atraumatic Neck: supple, no paraspinal tenderness, slight limitation of neck extension Back: No paraspinal tenderness Heart: regular rate and rhythm Lungs: Clear to auscultation bilaterally. Vascular: No carotid bruits. Skin/Extremities: No rash, no edema Neurological Exam: Mental status: alert and oriented to person, states it is April 1992, knows his birthday and president.  Able to follow simple commands, difficulty with 2-step commands. Fund of knowledge is decreased.  Attention and concentration are normal.    Able to name objects and repeat phrases. Cranial nerves: CN I: not tested CN II: pupils equal, round and reactive to light, visual fields appear intact, however when asked to do finger to nose test, he seemed unable to see finger on his right side.  Fundoscopy showed sharp discs bilaterally. CN III, IV, VI:  Slight limitation of upgaze, otherwise full range of motion, no nystagmus, no ptosis CN V: facial sensation intact CN VII: upper and lower face symmetric CN VIII: decreased hearing to conversational voice CN IX, X: gag intact, uvula midline CN XI: sternocleidomastoid and  trapezius muscles intact CN XII: tongue midline Bulk & Tone:  +cogwheeling on both wrists, bradykinetic. No resting tremor Motor:somewhat limited by right shoulder pain and left knee pain, but at least 4/5 throughout, no pronator drift. Sensation: decreased cold and pin on right UE, decreased vibration sense up to left knee, intact on right great toe.  Intact pin and cold on both LE.   Deep Tendon Reflexes: +2 on both UE, absent knee and ankle jerks bilaterally Plantar responses: mute Finger to nose testing: no incoordination on finger to nose, he did this without difficulty on the left, however did not seem to see my finger when right side was tested. Gait: wheelchair-bound, needs assist to stand, did not ambulate today   IMPRESSION: This is a 80 year old left-handed man with a history of hypertension, hyperlipidemia, CAD s/p CABG, presenting with cognitive and physical decline over the past few months.  Most recently, he has been more confused during physical therapy, with note of possible visual disturbance. His exam today shows similar inattention over the right visual field.  A head CT without contrast will be ordered to assess for underlying structural abnormality.  There is also note of bradykinesia, cogwheel rigidity, and limited upgaze, which can be seen with neurodegenerative disorders such as progressive supranuclear palsy. He may benefit from cholinesterase inhibitors, however will need to be monitored for side effects. He reports diffuse muscle weakness and pain. Exam also shows signs of peripheral neuropathy.  Check vitamin B12, CK level.  He will follow-up in 3 months.    Thank you for allowing me to participate in the care of this patient. Please do not hesitate to call for any questions or concerns.   Ellouise Newer, M.D.

## 2013-12-01 NOTE — Patient Instructions (Signed)
1. Schedule CT head without contrast 2. Bloodwork for TSH, B12, CK 3. Continue with physical therapy 4. Continue all medications

## 2013-12-02 ENCOUNTER — Ambulatory Visit (HOSPITAL_COMMUNITY)
Admission: RE | Admit: 2013-12-02 | Discharge: 2013-12-02 | Disposition: A | Discharge status: Home / Self Care | Payer: Medicare Other | Source: Ambulatory Visit | Attending: Internal Medicine | Admitting: Internal Medicine

## 2013-12-02 DIAGNOSIS — G319 Degenerative disease of nervous system, unspecified: Secondary | ICD-10-CM | POA: Insufficient documentation

## 2013-12-02 DIAGNOSIS — R531 Weakness: Secondary | ICD-10-CM

## 2013-12-02 DIAGNOSIS — H53459 Other localized visual field defect, unspecified eye: Secondary | ICD-10-CM

## 2013-12-02 MED ORDER — IOHEXOL 300 MG/ML  SOLN
100.0000 mL | Freq: Once | INTRAMUSCULAR | Status: AC | PRN
  Administered 2013-12-02: 75 mL via INTRAVENOUS

## 2013-12-05 ENCOUNTER — Non-Acute Institutional Stay (SKILLED_NURSING_FACILITY): Payer: Medicare Other | Admitting: Nurse Practitioner

## 2013-12-05 ENCOUNTER — Encounter: Payer: Self-pay | Admitting: Nurse Practitioner

## 2013-12-05 DIAGNOSIS — F4323 Adjustment disorder with mixed anxiety and depressed mood: Secondary | ICD-10-CM

## 2013-12-05 DIAGNOSIS — D62 Acute posthemorrhagic anemia: Secondary | ICD-10-CM

## 2013-12-05 DIAGNOSIS — K5900 Constipation, unspecified: Secondary | ICD-10-CM

## 2013-12-05 DIAGNOSIS — I2510 Atherosclerotic heart disease of native coronary artery without angina pectoris: Secondary | ICD-10-CM

## 2013-12-05 DIAGNOSIS — I69919 Unspecified symptoms and signs involving cognitive functions following unspecified cerebrovascular disease: Secondary | ICD-10-CM

## 2013-12-05 DIAGNOSIS — I69319 Unspecified symptoms and signs involving cognitive functions following cerebral infarction: Secondary | ICD-10-CM

## 2013-12-05 DIAGNOSIS — I10 Essential (primary) hypertension: Secondary | ICD-10-CM

## 2013-12-05 DIAGNOSIS — E039 Hypothyroidism, unspecified: Secondary | ICD-10-CM

## 2013-12-05 DIAGNOSIS — N400 Enlarged prostate without lower urinary tract symptoms: Secondary | ICD-10-CM

## 2013-12-05 DIAGNOSIS — R42 Dizziness and giddiness: Secondary | ICD-10-CM

## 2013-12-05 DIAGNOSIS — R413 Other amnesia: Secondary | ICD-10-CM

## 2013-12-05 HISTORY — DX: Unspecified symptoms and signs involving cognitive functions following cerebral infarction: I69.319

## 2013-12-05 NOTE — Assessment & Plan Note (Signed)
Hgb 11.5 12/01/13

## 2013-12-05 NOTE — Assessment & Plan Note (Signed)
Stable with Senna II from qd  along with MiraLax dailly and prn Bisacodyl suppository prn

## 2013-12-05 NOTE — Assessment & Plan Note (Signed)
Resolved

## 2013-12-05 NOTE — Assessment & Plan Note (Signed)
Takes Tamsulosin. No urinary retention.    

## 2013-12-05 NOTE — Progress Notes (Signed)
Patient ID: William Roach, male   DOB: 06-Aug-1921, 80 y.o.   MRN: [id number]   Code Status: DNR  No Known Allergies  Chief Complaint  Patient presents with  . Medical Managment of Chronic Issues    new identified chronic capsule infart   . Acute Visit    HPI: Patient is a 80 y.o. male seen in the SNF at [facility] today for evaluation of chronic left external capsule infart-cognitive decline/resolved peripheral vision loss,  lack of coordination, anxiety/depression/wts,  and other chronic medical conditions.  Problem List Items Addressed This Visit   Unspecified hypothyroidism     Takes Levothyroxine 30mcg, TSH 2.35 07/07/13 and 1.761 12/01/13     Unspecified constipation     Stable with Senna II from qd  along with MiraLax dailly and prn Bisacodyl suppository prn     Memory deficit     MMSE 25/30, repeat memory test. May consider memory preserving medication. Worse in the past 2-3 weeks. PNA, Levaquin, increased Mirtazapine may also contribute to the condition. Likely vascular dementia-CT head 12/02/13 atrophy and left external capsule infarct. Vit B12 405 12/02/13    HTN (hypertension)     Controlled       Dizziness and giddiness     Resolved.     CVA, old, cognitive deficits - Primary     Slow mentation with confusion and body movements. CT head 12/02/13 showed chronic left external capsule infarct. Continue SNF for care.     CAD (coronary artery disease)     S/p CABG >20 years. No c/o angina or chest pressures. Takes ASA     BPH (benign prostatic hyperplasia)     Takes Tamsulosin. No urinary retention.       Adjustment disorder with mixed anxiety and depressed mood     Hx of anxiety and Ativan use. Responded to Mirtazapine 7.5mg  until the recent PNA with Levaquin tx. Continue with anxiety/flat affect with  Mirtazapine 15mg . Also weights: Feb #145, Jan #150, Dec #145. No change since off  Mirtazapine to 7.5mg  nightly. Prn Ativan available to him. Still has angry and  anxious moments. May better manage his mood vs memory with mood stabilizer or memory preserving meds. Memory test pending.       Acute blood loss anemia     Hgb 11.5 12/01/13       Review of Systems:  Review of Systems  Constitutional: Negative for fever, chills, weight loss and malaise/fatigue.       Generalized weakness HENT: Negative for congestion, ear discharge, ear pain, hearing loss, nosebleeds, sore throat and tinnitus.   Eyes: Negative for blurred vision, double vision, photophobia, pain, discharge and redness. ?periphearl vision loss Respiratory: Negative for cough, hemoptysis, sputum production, shortness of breath, wheezing and stridor.   Cardiovascular: Positive for leg swelling. Negative for chest pain, palpitations, orthopnea, claudication and PND.       Mild LLE.  Gastrointestinal: Negative for heartburn, nausea, vomiting, abdominal pain, diarrhea, constipation, blood in stool and melena.  Genitourinary: Negative for dysuria, urgency, frequency, hematuria and flank pain.  Musculoskeletal: Positive for joint pain. Negative for back pain, falls, myalgias and neck pain.       Left knee-much better.  Skin: Negative for itching and rash.       The left knee surgical incision is healed. Mild fever and erythema still presents  Neurological: Positive for sensory change and weakness. Negative for dizziness, tingling, tremors, speech change, focal weakness, seizures, loss of consciousness and headaches.  Numbness in BLE/feet in am-better once he is up. Difficulty ambulates with walker independently. Slow mentation and limbs movements Endo/Heme/Allergies: Negative for environmental allergies and polydipsia. Does not bruise/bleed easily.  Psychiatric/Behavioral: Positive for depression and memory loss. Negative for suicidal ideas, hallucinations and substance abuse. The patient is nervous/anxious. The patient does not have insomnia.        Sad facial looks.      Past Medical  History  Diagnosis Date  . Coronary artery disease     post CABG x4   . Hypertension   . Hypercholesterolemia   . Fatigue   . Dizziness   . Basal cell carcinoma, ear     post -- partial pinnectomy left side with primary closure    . Hypothyroidism   . History of kidney stones     x1   Past Surgical History  Procedure Laterality Date  . Coronary artery bypass graft  2003    x4 --   . Cholecystectomy  2002  . Inguinal hernia repair  09/19/2007  . Ear cyst excision  12/14/2009    Excision of left auricular cyst with primary closure  . Basal cell carcinoma excision  01/14/2010    Wedge resection, partial pinnectomy left side with primary closure      . Tonsillectomy    . Carpal tunnel release    . Cataract surgery Bilateral 06-24-13  . Tonsillectomy    . Cystoscopy    . Total knee arthroplasty Left 06/27/2013    Procedure: LEFT TOTAL KNEE ARTHROPLASTY;  Surgeon: Jacki Cones, MD;  Location: [facility];  Service: Orthopedics;  Laterality: Left;   Social History:   reports that he quit smoking about 32 years ago. He does not have any smokeless tobacco history on file. He reports that he does not drink alcohol or use illicit drugs.  Family History  Problem Relation Age of Onset  . Parkinsonism Father   . Angina Mother   . Coronary artery disease Brother     with CABG    Medications: Patient's Medications  New Prescriptions   No medications on file  Previous Medications   BISACODYL (DULCOLAX) 10 MG SUPPOSITORY    Place 1 suppository (10 mg total) rectally daily as needed.   LEVOFLOXACIN (LEVAQUIN) 250 MG TABLET       LEVOTHYROXINE (SYNTHROID, LEVOTHROID) 50 MCG TABLET    Take 50 mcg by mouth daily before breakfast.    LORAZEPAM (ATIVAN) 0.5 MG TABLET       METHOCARBAMOL (ROBAXIN) 500 MG TABLET    Take 1 tablet (500 mg total) by mouth every 6 (six) hours as needed.   MIRTAZAPINE (REMERON) 15 MG TABLET    Take 15 mg by mouth at bedtime.   OXYCODONE (OXY IR/ROXICODONE) 5 MG  IMMEDIATE RELEASE TABLET    Take 1-2 tablets (5-10 mg total) by mouth every 3 (three) hours as needed.   POLYETHYLENE GLYCOL (MIRALAX / GLYCOLAX) PACKET    Take 17 g by mouth daily as needed.   SENNA (SENOKOT) 8.6 MG TABLET    Take 2 tablets by mouth daily.    TAMSULOSIN (FLOMAX) 0.4 MG CAPS CAPSULE    Take 1 capsule (0.4 mg total) by mouth daily.  Modified Medications   No medications on file  Discontinued Medications   No medications on file     Physical Exam: Physical Exam  Constitutional: He is oriented to person, place, and time. He appears well-developed and well-nourished. No distress.  generalized weakness. No longer  transferring self and ambulates with walker.   HENT:  Head: Normocephalic and atraumatic.  Right Ear: External ear normal.  Left Ear: External ear normal.  Nose: Nose normal.  Mouth/Throat: Oropharynx is clear and moist. No oropharyngeal exudate.  Eyes: Conjunctivae and EOM are normal. Pupils are equal, round, and reactive to light. Right eye exhibits no discharge. Left eye exhibits no discharge. No scleral icterus.  Neck: Normal range of motion. Neck supple. No JVD present. No tracheal deviation present. No thyromegaly present.  Cardiovascular: Normal rate, regular rhythm, normal heart sounds and intact distal pulses.   No murmur heard. Pulmonary/Chest: Effort normal. No stridor. No respiratory distress. He has no wheezes. He has rales. He exhibits no tenderness.  Dry rales bases.   Abdominal: Soft. Bowel sounds are normal. He exhibits no distension. There is no tenderness. There is no rebound and no guarding.  Genitourinary: Penis normal.  Musculoskeletal: Normal range of motion. He exhibits edema and tenderness.   Left knee mild swelling and warmth with limited flexion and extension ROM.  Lymphadenopathy:    He has no cervical adenopathy.  Neurological: He is alert and oriented to person, place, and time. He has normal reflexes. No cranial nerve deficit. He  exhibits normal muscle tone. Coordination abnormal.   Flat affect.  Skin: Skin is warm and dry. No rash noted. He is not diaphoretic. There is erythema. No pallor.  The left knee surgical incision is healed. Mild erythema Left knee.   Psychiatric: His speech is normal. Judgment and thought content normal. His mood appears anxious. He is slowed and withdrawn. Cognition and memory are impaired. He exhibits abnormal recent memory.  Flat affect.     Filed Vitals:   12/05/13 1410  BP: 119/84  Pulse: 69  Temp: 97 F (36.1 C)  TempSrc: Tympanic  Resp: 18      Labs reviewed: Basic Metabolic Panel:  Recent Labs  06/24/13 1430 06/28/13 0541 06/29/13 0540  07/07/13 10/13/13 10/29/13 12/01/13  NA 136 136 135  < > 138 142 138 141  K 4.9 4.1 3.7  --  3.8 4.4 3.9 4.3  CL 101 103 102  --   --   --   --   --   CO2 26 26 24   --   --   --   --   --   GLUCOSE 97 115* 121*  --   --   --   --   --   BUN 31* 19 22  < > 20 26* 19 29*  CREATININE 0.98 0.83 0.81  < > 0.7 0.8 0.7 0.7  CALCIUM 9.7 8.6 8.5  --   --   --   --   --   TSH  --   --   --   --  2.35  --   --   --   < > = values in this interval not displayed. Liver Function Tests:  Recent Labs  04/09/13 1030 05/14/13 0927 06/24/13 1430 07/07/13 10/29/13 12/01/13  AST 83* 33 30 19 14  13*  ALT 137* 40 22 19 9* 9*  ALKPHOS 76 71 87 114 98 149*  BILITOT 1.0 1.1 0.6  --   --   --   PROT 6.6 6.3 6.7  --   --   --   ALBUMIN 4.1 3.9 3.9  --   --   --    CBC:  Recent Labs  06/29/13 0540 06/30/13 0425 06/30/13 1515 07/01/13 0428  10/13/13  10/29/13 12/01/13  WBC 12.9* 9.7  --  9.3  < > 8.0 11.7 10.8  NEUTROABS  --   --   --  7.0  --   --   --   --   HGB 9.6* 8.3* 8.6* 8.1*  < > 14.6 12.8* 11.5*  HCT 28.2* 24.9* 24.9* 23.5*  < > 43 38* 34*  MCV 93.1 92.6  --  92.5  --   --   --   --   PLT 113* 92*  --  117*  < > 192 199 327  < > = values in this interval not displayed. Lipid Panel:  Recent Labs  04/09/13 1030  CHOL 138  HDL  40.10  LDLCALC 69  TRIG 144.0  CHOLHDL 3   Past Procedures:  10/28/13 CXR minimal cardiomegaly wihtout pulmonary vascular congestion, no pleural effusion, patchy bibasilar pneumonitis.   12/02/13 CT with and without contrast: atrophy and chronic left external capsule infarct. No acute abnormality.   Assessment/Plan CVA, old, cognitive deficits Slow mentation with confusion and body movements. CT head 12/02/13 showed chronic left external capsule infarct. Continue SNF for care.   Acute blood loss anemia Hgb 11.5 12/01/13  Adjustment disorder with mixed anxiety and depressed mood Hx of anxiety and Ativan use. Responded to Mirtazapine 7.5mg  until the recent PNA with Levaquin tx. Continue with anxiety/flat affect with  Mirtazapine 15mg . Also weights: Feb #145, Jan #150, Dec #145. No change since off  Mirtazapine to 7.5mg  nightly. Prn Ativan available to him. Still has angry and anxious moments. May better manage his mood vs memory with mood stabilizer or memory preserving meds. Memory test pending.     BPH (benign prostatic hyperplasia) Takes Tamsulosin. No urinary retention.     CAD (coronary artery disease) S/p CABG >20 years. No c/o angina or chest pressures. Takes ASA   Dizziness and giddiness Resolved.   HTN (hypertension) Controlled     Memory deficit MMSE 25/30, repeat memory test. May consider memory preserving medication. Worse in the past 2-3 weeks. PNA, Levaquin, increased Mirtazapine may also contribute to the condition. Likely vascular dementia-CT head 12/02/13 atrophy and left external capsule infarct. Vit B12 405 12/02/13  Unspecified constipation Stable with Senna II from qd  along with MiraLax dailly and prn Bisacodyl suppository prn   Unspecified hypothyroidism Takes Levothyroxine 43mcg, TSH 2.35 07/07/13 and 1.761 12/01/13     Family/ Staff Communication: observe the patient. Monitor weights. S/p Neurology consult.   Goals of Care: SNF  Labs/tests ordered:  none

## 2013-12-05 NOTE — Assessment & Plan Note (Signed)
Takes Levothyroxine 50mcg, TSH 2.35 07/07/13 and 1.761 12/01/13    

## 2013-12-05 NOTE — Assessment & Plan Note (Signed)
Controlled.  

## 2013-12-05 NOTE — Assessment & Plan Note (Signed)
Hx of anxiety and Ativan use. Responded to Mirtazapine 7.5mg  until the recent PNA with Levaquin tx. Continue with anxiety/flat affect with  Mirtazapine 15mg . Also weights: Feb #145, Jan #150, Dec #145. No change since off  Mirtazapine to 7.5mg  nightly. Prn Ativan available to him. Still has angry and anxious moments. May better manage his mood vs memory with mood stabilizer or memory preserving meds. Memory test pending.

## 2013-12-05 NOTE — Assessment & Plan Note (Signed)
S/p CABG >20 years. No c/o angina or chest pressures. Takes ASA   

## 2013-12-05 NOTE — Assessment & Plan Note (Signed)
Slow mentation with confusion and body movements. CT head 12/02/13 showed chronic left external capsule infarct. Continue SNF for care.  

## 2013-12-05 NOTE — Assessment & Plan Note (Addendum)
MMSE 25/30, repeat memory test. May consider memory preserving medication. Worse in the past 2-3 weeks. PNA, Levaquin, increased Mirtazapine may also contribute to the condition. Likely vascular dementia-CT head 12/02/13 atrophy and left external capsule infarct. Vit B12 405 12/02/13

## 2013-12-12 ENCOUNTER — Non-Acute Institutional Stay (SKILLED_NURSING_FACILITY): Payer: Medicare Other | Admitting: Nurse Practitioner

## 2013-12-12 ENCOUNTER — Encounter: Payer: Self-pay | Admitting: Nurse Practitioner

## 2013-12-12 DIAGNOSIS — I69919 Unspecified symptoms and signs involving cognitive functions following unspecified cerebrovascular disease: Secondary | ICD-10-CM

## 2013-12-12 DIAGNOSIS — K5900 Constipation, unspecified: Secondary | ICD-10-CM

## 2013-12-12 DIAGNOSIS — F4321 Adjustment disorder with depressed mood: Secondary | ICD-10-CM

## 2013-12-12 DIAGNOSIS — N400 Enlarged prostate without lower urinary tract symptoms: Secondary | ICD-10-CM

## 2013-12-12 DIAGNOSIS — R42 Dizziness and giddiness: Secondary | ICD-10-CM

## 2013-12-12 DIAGNOSIS — I69319 Unspecified symptoms and signs involving cognitive functions following cerebral infarction: Secondary | ICD-10-CM

## 2013-12-12 DIAGNOSIS — R413 Other amnesia: Secondary | ICD-10-CM

## 2013-12-12 DIAGNOSIS — E039 Hypothyroidism, unspecified: Secondary | ICD-10-CM

## 2013-12-12 DIAGNOSIS — I10 Essential (primary) hypertension: Secondary | ICD-10-CM

## 2013-12-12 NOTE — Assessment & Plan Note (Signed)
Takes Tamsulosin. No urinary retention.    

## 2013-12-12 NOTE — Assessment & Plan Note (Signed)
Takes Levothyroxine 50mcg, TSH 2.35 07/07/13 and 1.761 12/01/13    

## 2013-12-12 NOTE — Assessment & Plan Note (Signed)
Hx of anxiety and Ativan use. Failed Mirtazapine with initial improvement. Will try Sertraline 25mg  daily-titrate as needed.

## 2013-12-12 NOTE — Assessment & Plan Note (Signed)
Resolved

## 2013-12-12 NOTE — Assessment & Plan Note (Signed)
Stable with Senna II from qd  along with MiraLax dailly and prn Bisacodyl suppository prn 

## 2013-12-12 NOTE — Assessment & Plan Note (Addendum)
MMSE 25/30, repeat memory test pending. 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

## 2013-12-12 NOTE — Assessment & Plan Note (Signed)
Slow mentation with confusion and body movements. CT head 12/02/13 showed chronic left external capsule infarct. Continue SNF for care.

## 2013-12-12 NOTE — Assessment & Plan Note (Signed)
Controlled.  

## 2013-12-12 NOTE — Progress Notes (Signed)
Patient ID: William Roach, male   DOB: 08-26-1921, 80 y.o.   MRN: [id number]   Code Status: DNR  No Known Allergies  Chief Complaint  Patient presents with  . Medical Managment of Chronic Issues  . Acute Visit    fall, confusion, agitaion, uncooperative.     HPI: Patient is a 80 y.o. male seen in the SNF at [facility] today for evaluation of chronic left external capsule infart-cognitive decline/confusion/agitation/uncooperative, slid to foot of recliner,  lack of coordination, anxiety/depression,  and other chronic medical conditions.  Problem List Items Addressed This Visit   Unspecified hypothyroidism - Primary     Takes Levothyroxine 81mcg, TSH 2.35 07/07/13 and 1.761 12/01/13     Unspecified constipation     Stable with Senna II from qd  along with MiraLax dailly and prn Bisacodyl suppository prn      Situational depression     Hx of anxiety and Ativan use. Failed Mirtazapine with initial improvement. Will try Sertraline 25mg  daily-titrate as needed.       Relevant Medications      sertraline (ZOLOFT) 25 MG tablet   Memory deficit     MMSE 25/30, repeat memory test pending. 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      HTN (hypertension)     Controlled      Dizziness and giddiness     Resolved.      CVA, old, cognitive deficits     Slow mentation with confusion and body movements. CT head 12/02/13 showed chronic left external capsule infarct. Continue SNF for care.     BPH (benign prostatic hyperplasia)     Takes Tamsulosin. No urinary retention.          Review of Systems:  Review of Systems  Constitutional: Negative for fever, chills, weight loss and malaise/fatigue.       Generalized weakness HENT: Negative for congestion, ear discharge, ear pain, hearing loss, nosebleeds, sore throat and tinnitus.   Eyes: Negative for blurred vision, double vision, photophobia, pain, discharge and redness. ?periphearl vision loss Respiratory: Negative  for cough, hemoptysis, sputum production, shortness of breath, wheezing and stridor.   Cardiovascular: Positive for leg swelling. Negative for chest pain, palpitations, orthopnea, claudication and PND.       Mild LLE.  Gastrointestinal: Negative for heartburn, nausea, vomiting, abdominal pain, diarrhea, constipation, blood in stool and melena.  Genitourinary: Negative for dysuria, urgency, frequency, hematuria and flank pain.  Musculoskeletal: Positive for joint pain. Negative for back pain, falls, myalgias and neck pain.       Left knee-much better.  Skin: Negative for itching and rash.       The left knee surgical incision is healed. Mild fever and erythema still presents  Neurological: Positive for sensory change and weakness. Negative for dizziness, tingling, tremors, speech change, focal weakness, seizures, loss of consciousness and headaches.       Numbness in BLE/feet in am-better once he is up. Difficulty ambulates with walker independently. Slow mentation and limbs movements. Slid to foot of recliner and again 20 minutes later.  Endo/Heme/Allergies: Negative for environmental allergies and polydipsia. Does not bruise/bleed easily.  Psychiatric/Behavioral: Positive for depression and memory loss. Negative for suicidal ideas, hallucinations and substance abuse. The patient is nervous/anxious. The patient does not have insomnia.        Sad facial looks.      Past Medical History  Diagnosis Date  . Coronary artery disease     post CABG x4   .  Hypertension   . Hypercholesterolemia   . Fatigue   . Dizziness   . Basal cell carcinoma, ear     post -- partial pinnectomy left side with primary closure    . Hypothyroidism   . History of kidney stones     x1   Past Surgical History  Procedure Laterality Date  . Coronary artery bypass graft  2003    x4 --   . Cholecystectomy  2002  . Inguinal hernia repair  09/19/2007  . Ear cyst excision  12/14/2009    Excision of left auricular  cyst with primary closure  . Basal cell carcinoma excision  01/14/2010    Wedge resection, partial pinnectomy left side with primary closure      . Tonsillectomy    . Carpal tunnel release    . Cataract surgery Bilateral 06-24-13  . Tonsillectomy    . Cystoscopy    . Total knee arthroplasty Left 06/27/2013    Procedure: LEFT TOTAL KNEE ARTHROPLASTY;  Surgeon: Tobi Bastos, MD;  Location: [facility];  Service: Orthopedics;  Laterality: Left;   Social History:   reports that he quit smoking about 32 years ago. He does not have any smokeless tobacco history on file. He reports that he does not drink alcohol or use illicit drugs.  Family History  Problem Relation Age of Onset  . Parkinsonism Father   . Angina Mother   . Coronary artery disease Brother     with CABG    Medications: Patient's Medications  New Prescriptions   No medications on file  Previous Medications   BISACODYL (DULCOLAX) 10 MG SUPPOSITORY    Place 1 suppository (10 mg total) rectally daily as needed.   LEVOTHYROXINE (SYNTHROID, LEVOTHROID) 50 MCG TABLET    Take 50 mcg by mouth daily before breakfast.    LORAZEPAM (ATIVAN) 0.5 MG TABLET    0.5 mg every 4 (four) hours as needed.    METHOCARBAMOL (ROBAXIN) 500 MG TABLET    Take 1 tablet (500 mg total) by mouth every 6 (six) hours as needed.   MIRTAZAPINE (REMERON) 15 MG TABLET    Take 15 mg by mouth at bedtime.   OXYCODONE (OXY IR/ROXICODONE) 5 MG IMMEDIATE RELEASE TABLET    Take 1-2 tablets (5-10 mg total) by mouth every 3 (three) hours as needed.   POLYETHYLENE GLYCOL (MIRALAX / GLYCOLAX) PACKET    Take 17 g by mouth daily as needed.   SENNA (SENOKOT) 8.6 MG TABLET    Take 2 tablets by mouth daily.    SERTRALINE (ZOLOFT) 25 MG TABLET    Take 25 mg by mouth daily.   TAMSULOSIN (FLOMAX) 0.4 MG CAPS CAPSULE    Take 1 capsule (0.4 mg total) by mouth daily.  Modified Medications   No medications on file  Discontinued Medications   LEVOFLOXACIN (LEVAQUIN) 250 MG TABLET          Physical Exam: Physical Exam  Constitutional: He is oriented to person, place, and time. He appears well-developed and well-nourished. No distress.  generalized weakness. No longer transferring self and ambulates with walker.   HENT:  Head: Normocephalic and atraumatic.  Right Ear: External ear normal.  Left Ear: External ear normal.  Nose: Nose normal.  Mouth/Throat: Oropharynx is clear and moist. No oropharyngeal exudate.  Eyes: Conjunctivae and EOM are normal. Pupils are equal, round, and reactive to light. Right eye exhibits no discharge. Left eye exhibits no discharge. No scleral icterus.  Neck: Normal range of motion.  Neck supple. No JVD present. No tracheal deviation present. No thyromegaly present.  Cardiovascular: Normal rate, regular rhythm, normal heart sounds and intact distal pulses.   No murmur heard. Pulmonary/Chest: Effort normal. No stridor. No respiratory distress. He has no wheezes. He has rales. He exhibits no tenderness.  Dry rales bases.   Abdominal: Soft. Bowel sounds are normal. He exhibits no distension. There is no tenderness. There is no rebound and no guarding.  Genitourinary: Penis normal.  Musculoskeletal: Normal range of motion. He exhibits edema and tenderness.   Left knee mild swelling and warmth with limited flexion and extension ROM.  Lymphadenopathy:    He has no cervical adenopathy.  Neurological: He is alert and oriented to person, place, and time. He has normal reflexes. No cranial nerve deficit. He exhibits normal muscle tone. Coordination abnormal.   Flat affect.  Skin: Skin is warm and dry. No rash noted. He is not diaphoretic. There is erythema. No pallor.  The left knee surgical incision is healed. Mild erythema Left knee.   Psychiatric: His speech is normal. Judgment and thought content normal. His mood appears anxious. He is slowed and withdrawn. Cognition and memory are impaired. He exhibits abnormal recent memory.  Flat affect.      Filed Vitals:   12/12/13 1501  BP: 141/77  Pulse: 84  Temp: 98.4 F (36.9 C)  TempSrc: Tympanic  Resp: 18      Labs reviewed: Basic Metabolic Panel:  Recent Labs  06/24/13 1430 06/28/13 0541 06/29/13 0540  07/07/13 10/13/13 10/29/13 12/01/13  NA 136 136 135  < > 138 142 138 141  K 4.9 4.1 3.7  --  3.8 4.4 3.9 4.3  CL 101 103 102  --   --   --   --   --   CO2 26 26 24   --   --   --   --   --   GLUCOSE 97 115* 121*  --   --   --   --   --   BUN 31* 19 22  < > 20 26* 19 29*  CREATININE 0.98 0.83 0.81  < > 0.7 0.8 0.7 0.7  CALCIUM 9.7 8.6 8.5  --   --   --   --   --   TSH  --   --   --   --  2.35  --   --   --   < > = values in this interval not displayed. Liver Function Tests:  Recent Labs  04/09/13 1030 05/14/13 0927 06/24/13 1430 07/07/13 10/29/13 12/01/13  AST 83* 33 30 19 14  13*  ALT 137* 40 22 19 9* 9*  ALKPHOS 76 71 87 114 98 149*  BILITOT 1.0 1.1 0.6  --   --   --   PROT 6.6 6.3 6.7  --   --   --   ALBUMIN 4.1 3.9 3.9  --   --   --    CBC:  Recent Labs  06/29/13 0540 06/30/13 0425 06/30/13 1515 07/01/13 0428  10/13/13 10/29/13 12/01/13  WBC 12.9* 9.7  --  9.3  < > 8.0 11.7 10.8  NEUTROABS  --   --   --  7.0  --   --   --   --   HGB 9.6* 8.3* 8.6* 8.1*  < > 14.6 12.8* 11.5*  HCT 28.2* 24.9* 24.9* 23.5*  < > 43 38* 34*  MCV 93.1 92.6  --  92.5  --   --   --   --  PLT 113* 92*  --  117*  < > 192 199 327  < > = values in this interval not displayed. Lipid Panel:  Recent Labs  04/09/13 1030  CHOL 138  HDL 40.10  LDLCALC 69  TRIG 144.0  CHOLHDL 3   Past Procedures:  10/28/13 CXR minimal cardiomegaly wihtout pulmonary vascular congestion, no pleural effusion, patchy bibasilar pneumonitis.   12/02/13 CT with and without contrast: atrophy and chronic left external capsule infarct. No acute abnormality.   Assessment/Plan Unspecified hypothyroidism Takes Levothyroxine 43mcg, TSH 2.35 07/07/13 and 1.761 12/01/13   Unspecified  constipation Stable with Senna II from qd  along with MiraLax dailly and prn Bisacodyl suppository prn    Memory deficit MMSE 25/30, repeat memory test pending. 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    Situational depression Hx of anxiety and Ativan use. Failed Mirtazapine with initial improvement. Will try Sertraline 25mg  daily-titrate as needed.     BPH (benign prostatic hyperplasia) Takes Tamsulosin. No urinary retention.     HTN (hypertension) Controlled    Dizziness and giddiness Resolved.    CVA, old, cognitive deficits Slow mentation with confusion and body movements. CT head 12/02/13 showed chronic left external capsule infarct. Continue SNF for care.     Family/ Staff Communication: observe the patient. Monitor weights. S/p Neurology consult.   Goals of Care: SNF  Labs/tests ordered: none

## 2013-12-22 ENCOUNTER — Encounter: Payer: Self-pay | Admitting: Internal Medicine

## 2013-12-22 ENCOUNTER — Ambulatory Visit: Payer: BC Managed Care – PPO | Admitting: Neurology

## 2013-12-23 ENCOUNTER — Encounter: Payer: Self-pay | Admitting: Nurse Practitioner

## 2013-12-23 ENCOUNTER — Non-Acute Institutional Stay (SKILLED_NURSING_FACILITY): Payer: Medicare Other | Admitting: Nurse Practitioner

## 2013-12-23 DIAGNOSIS — I2510 Atherosclerotic heart disease of native coronary artery without angina pectoris: Secondary | ICD-10-CM

## 2013-12-23 DIAGNOSIS — D62 Acute posthemorrhagic anemia: Secondary | ICD-10-CM

## 2013-12-23 DIAGNOSIS — N400 Enlarged prostate without lower urinary tract symptoms: Secondary | ICD-10-CM

## 2013-12-23 DIAGNOSIS — I10 Essential (primary) hypertension: Secondary | ICD-10-CM

## 2013-12-23 DIAGNOSIS — I69319 Unspecified symptoms and signs involving cognitive functions following cerebral infarction: Secondary | ICD-10-CM

## 2013-12-23 DIAGNOSIS — E039 Hypothyroidism, unspecified: Secondary | ICD-10-CM

## 2013-12-23 DIAGNOSIS — I69919 Unspecified symptoms and signs involving cognitive functions following unspecified cerebrovascular disease: Secondary | ICD-10-CM

## 2013-12-23 DIAGNOSIS — R279 Unspecified lack of coordination: Secondary | ICD-10-CM

## 2013-12-23 DIAGNOSIS — K5900 Constipation, unspecified: Secondary | ICD-10-CM

## 2013-12-23 DIAGNOSIS — Z96659 Presence of unspecified artificial knee joint: Secondary | ICD-10-CM

## 2013-12-23 DIAGNOSIS — R413 Other amnesia: Secondary | ICD-10-CM

## 2013-12-23 DIAGNOSIS — F4321 Adjustment disorder with depressed mood: Secondary | ICD-10-CM

## 2013-12-23 NOTE — Progress Notes (Signed)
Patient ID: William Roach, male   DOB: 1920/12/22, 80 y.o.   MRN: [id number]   Code Status: DNR  No Known Allergies  Chief Complaint  Patient presents with  . Medical Managment of Chronic Issues  . Acute Visit    depression/anxiety, BP    HPI: Patient is a 80 y.o. male seen in the SNF at [facility] today for evaluation of depression and other chronic medical conditions.  Problem List Items Addressed This Visit   CAD (coronary artery disease)     S/p CABG >20 years. No c/o angina or chest pressures. Takes ASA     HTN (hypertension) - Primary     Controlled      Acute blood loss anemia     Hgb 11.5 12/01/13     S/P knee replacement     Healing nicely. Slight heat in the area. No overt swelling or pain.     BPH (benign prostatic hyperplasia)     Takes Tamsulosin. No urinary retention.       Unspecified constipation     Stable with Senna II from qd  along with MiraLax dailly and prn Bisacodyl suppository prn    Unspecified hypothyroidism     Takes Levothyroxine 1mcg, TSH 2.35 07/07/13 and 1.761 12/01/13     Memory deficit     MMSE 25/30, repeat memory test pending. 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       Situational depression     Hx of anxiety and Ativan use. Failed Mirtazapine with initial improvement. Positive responses- increased Sertraline to 50mg  daily 12/19/13.      Relevant Medications      sertraline (ZOLOFT) 50 MG tablet   Lack of coordination     huffling gait, c/o numbness in BLE, difficulty aiming his mouth when eats, son reported short lived slurred speech, no longer ambulates with walker(generalized weakness vs lack of coordination-muscle strength 5/5 x4 limbs), mild right facial droop. S/p Neurology consult. No change in tx.       CVA, old, cognitive deficits     low mentation with confusion and body movements. CT head 12/02/13 showed chronic left external capsule infarct. Continue SNF for care.         Review of Systems:    Review of Systems  Constitutional: Negative for fever, chills, weight loss and malaise/fatigue.       Generalized weakness-no longer ambulates with walker and difficulty transferring self  HENT: Negative for congestion, ear discharge, ear pain, hearing loss, nosebleeds, sore throat and tinnitus.   Eyes: Negative for blurred vision, double vision, photophobia, pain, discharge and redness.  Respiratory: Negative for cough, hemoptysis, sputum production, shortness of breath, wheezing and stridor.   Cardiovascular: Positive for leg swelling. Negative for chest pain, palpitations, orthopnea, claudication and PND.       Trace LLE  Gastrointestinal: Negative for heartburn, nausea, vomiting, abdominal pain, diarrhea, constipation, blood in stool and melena.  Genitourinary: Negative for dysuria, urgency, frequency, hematuria and flank pain.  Musculoskeletal: Positive for joint pain. Negative for back pain, falls, myalgias and neck pain.       Left knee-much better.   Skin: Negative for itching and rash.       The left knee surgical incision is healed. Mild fever and erythema still presents  Neurological: Positive for sensory change and weakness. Negative for dizziness, tingling, tremors, speech change, focal weakness, seizures, loss of consciousness and headaches.       Numbness in BLE/feet in am-better once  he is up. Difficulty ambulates with walker independently  Endo/Heme/Allergies: Negative for environmental allergies and polydipsia. Does not bruise/bleed easily.  Psychiatric/Behavioral: Positive for depression and memory loss. Negative for suicidal ideas, hallucinations and substance abuse. The patient is nervous/anxious. The patient does not have insomnia.        Sad facial looks-better/brighter     Past Medical History  Diagnosis Date  . Coronary artery disease     post CABG x4   . Hypertension   . Hypercholesterolemia   . Fatigue   . Dizziness   . Basal cell carcinoma, ear     post --  partial pinnectomy left side with primary closure    . Hypothyroidism   . History of kidney stones     x1   Past Surgical History  Procedure Laterality Date  . Coronary artery bypass graft  2003    x4 --   . Cholecystectomy  2002  . Inguinal hernia repair  09/19/2007  . Ear cyst excision  12/14/2009    Excision of left auricular cyst with primary closure  . Basal cell carcinoma excision  01/14/2010    Wedge resection, partial pinnectomy left side with primary closure      . Tonsillectomy    . Carpal tunnel release    . Cataract surgery Bilateral 06-24-13  . Tonsillectomy    . Cystoscopy    . Total knee arthroplasty Left 06/27/2013    Procedure: LEFT TOTAL KNEE ARTHROPLASTY;  Surgeon: Tobi Bastos, MD;  Location: [facility];  Service: Orthopedics;  Laterality: Left;   Social History:   reports that he quit smoking about 32 years ago. He does not have any smokeless tobacco history on file. He reports that he does not drink alcohol or use illicit drugs.  Family History  Problem Relation Age of Onset  . Parkinsonism Father   . Angina Mother   . Coronary artery disease Brother     with CABG    Medications: Patient's Medications  New Prescriptions   No medications on file  Previous Medications   BISACODYL (DULCOLAX) 10 MG SUPPOSITORY    Place 1 suppository (10 mg total) rectally daily as needed.   LEVOTHYROXINE (SYNTHROID, LEVOTHROID) 50 MCG TABLET    Take 50 mcg by mouth daily before breakfast.    LORAZEPAM (ATIVAN) 0.5 MG TABLET    0.5 mg every 4 (four) hours as needed.    METHOCARBAMOL (ROBAXIN) 500 MG TABLET    Take 1 tablet (500 mg total) by mouth every 6 (six) hours as needed.   OXYCODONE (OXY IR/ROXICODONE) 5 MG IMMEDIATE RELEASE TABLET    Take 1-2 tablets (5-10 mg total) by mouth every 3 (three) hours as needed.   POLYETHYLENE GLYCOL (MIRALAX / GLYCOLAX) PACKET    Take 17 g by mouth daily as needed.   SENNA (SENOKOT) 8.6 MG TABLET    Take 2 tablets by mouth daily.     SERTRALINE (ZOLOFT) 50 MG TABLET    Take 50 mg by mouth daily.   TAMSULOSIN (FLOMAX) 0.4 MG CAPS CAPSULE    Take 1 capsule (0.4 mg total) by mouth daily.  Modified Medications   No medications on file  Discontinued Medications   MIRTAZAPINE (REMERON) 15 MG TABLET    Take 15 mg by mouth at bedtime.   SERTRALINE (ZOLOFT) 25 MG TABLET    Take 25 mg by mouth daily.     Physical Exam: Physical Exam  Constitutional: He is oriented to person, place, and time.  He appears well-developed and well-nourished. No distress.  generalized weakness. No longer transferring self and ambulates with walker.   HENT:  Head: Normocephalic and atraumatic.  Right Ear: External ear normal.  Left Ear: External ear normal.  Nose: Nose normal.  Mouth/Throat: Oropharynx is clear and moist. No oropharyngeal exudate.  Eyes: Conjunctivae and EOM are normal. Pupils are equal, round, and reactive to light. Right eye exhibits no discharge. Left eye exhibits no discharge. No scleral icterus.  Neck: Normal range of motion. Neck supple. No JVD present. No tracheal deviation present. No thyromegaly present.  Cardiovascular: Normal rate, regular rhythm, normal heart sounds and intact distal pulses.   No murmur heard. Pulmonary/Chest: Effort normal. No stridor. No respiratory distress. He has no wheezes. He has rales. He exhibits no tenderness.  Dry rales bases.   Abdominal: Soft. Bowel sounds are normal. He exhibits no distension. There is no tenderness. There is no rebound and no guarding.  Genitourinary: Penis normal.  Musculoskeletal: Normal range of motion. He exhibits edema and tenderness.   Left knee mild swelling and warmth with limited flexion and extension ROM.   Lymphadenopathy:    He has no cervical adenopathy.  Neurological: He is alert and oriented to person, place, and time. He has normal reflexes. No cranial nerve deficit. He exhibits normal muscle tone. Coordination abnormal.  Decreased coordination of the  RUE-improving.   Skin: Skin is warm and dry. No rash noted. He is not diaphoretic. There is erythema. No pallor.  The left knee surgical incision is healed. Mild erythema Left knee.   Psychiatric: His speech is normal. Judgment and thought content normal. His mood appears anxious. He is slowed and withdrawn. Cognition and memory are impaired. He exhibits abnormal recent memory.  Flat affect-improved.     Filed Vitals:   12/23/13 1500  BP: 128/78  Pulse: 76  Temp: 97.2 F (36.2 C)  TempSrc: Tympanic  Resp: 18      Labs reviewed: Basic Metabolic Panel:  Recent Labs  06/24/13 1430 06/28/13 0541 06/29/13 0540  07/07/13 10/13/13 10/29/13 12/01/13  NA 136 136 135  < > 138 142 138 141  K 4.9 4.1 3.7  --  3.8 4.4 3.9 4.3  CL 101 103 102  --   --   --   --   --   CO2 26 26 24   --   --   --   --   --   GLUCOSE 97 115* 121*  --   --   --   --   --   BUN 31* 19 22  < > 20 26* 19 29*  CREATININE 0.98 0.83 0.81  < > 0.7 0.8 0.7 0.7  CALCIUM 9.7 8.6 8.5  --   --   --   --   --   TSH  --   --   --   --  2.35  --   --   --   < > = values in this interval not displayed. Liver Function Tests:  Recent Labs  04/09/13 1030 05/14/13 0927 06/24/13 1430 07/07/13 10/29/13 12/01/13  AST 83* 33 30 19 14  13*  ALT 137* 40 22 19 9* 9*  ALKPHOS 76 71 87 114 98 149*  BILITOT 1.0 1.1 0.6  --   --   --   PROT 6.6 6.3 6.7  --   --   --   ALBUMIN 4.1 3.9 3.9  --   --   --  CBC:  Recent Labs  06/29/13 0540 06/30/13 0425 06/30/13 1515 07/01/13 0428  10/13/13 10/29/13 12/01/13  WBC 12.9* 9.7  --  9.3  < > 8.0 11.7 10.8  NEUTROABS  --   --   --  7.0  --   --   --   --   HGB 9.6* 8.3* 8.6* 8.1*  < > 14.6 12.8* 11.5*  HCT 28.2* 24.9* 24.9* 23.5*  < > 43 38* 34*  MCV 93.1 92.6  --  92.5  --   --   --   --   PLT 113* 92*  --  117*  < > 192 199 327  < > = values in this interval not displayed. Lipid Panel:  Recent Labs  04/09/13 1030  CHOL 138  HDL 40.10  LDLCALC 69  TRIG 144.0    CHOLHDL 3    Past Procedures:  10/28/13 CXR minimal cardiomegaly wihtout pulmonary vascular congestion, no pleural effusion, patchy bibasilar pneumonitis.   12/02/13 CT with and without contrast: atrophy and chronic left external capsule infarct. No acute abnormality.   Assessment/Plan Situational depression Hx of anxiety and Ativan use. Failed Mirtazapine with initial improvement. Positive responses- increased Sertraline to 50mg  daily 12/19/13.    Unspecified hypothyroidism Takes Levothyroxine 45mcg, TSH 2.35 07/07/13 and 1.761 12/01/13   Unspecified constipation Stable with Senna II from qd  along with MiraLax dailly and prn Bisacodyl suppository prn  S/P knee replacement Healing nicely. Slight heat in the area. No overt swelling or pain.   Memory deficit MMSE 25/30, repeat memory test pending. 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     Lack of coordination huffling gait, c/o numbness in BLE, difficulty aiming his mouth when eats, son reported short lived slurred speech, no longer ambulates with walker(generalized weakness vs lack of coordination-muscle strength 5/5 x4 limbs), mild right facial droop. S/p Neurology consult. No change in tx.     HTN (hypertension) Controlled    CVA, old, cognitive deficits low mentation with confusion and body movements. CT head 12/02/13 showed chronic left external capsule infarct. Continue SNF for care.    CAD (coronary artery disease) S/p CABG >20 years. No c/o angina or chest pressures. Takes ASA   BPH (benign prostatic hyperplasia) Takes Tamsulosin. No urinary retention.     Acute blood loss anemia Hgb 11.5 12/01/13     Family/ Staff Communication: observe the patient.   Goals of Care: SNF  Labs/tests ordered: none

## 2013-12-24 NOTE — Assessment & Plan Note (Signed)
Controlled.  

## 2013-12-24 NOTE — Assessment & Plan Note (Signed)
Hx of anxiety and Ativan use. Failed Mirtazapine with initial improvement. Positive responses- increased Sertraline to 50mg  daily 12/19/13.

## 2013-12-24 NOTE — Assessment & Plan Note (Signed)
S/p CABG >20 years. No c/o angina or chest pressures. Takes ASA   

## 2013-12-24 NOTE — Assessment & Plan Note (Signed)
Healing nicely. Slight heat in the area. No overt swelling or pain.

## 2013-12-24 NOTE — Assessment & Plan Note (Signed)
MMSE 25/30, repeat memory test pending. 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   

## 2013-12-24 NOTE — Assessment & Plan Note (Signed)
Stable with Senna II from qd  along with MiraLax dailly and prn Bisacodyl suppository prn

## 2013-12-24 NOTE — Assessment & Plan Note (Signed)
huffling gait, c/o numbness in BLE, difficulty aiming his mouth when eats, son reported short lived slurred speech, no longer ambulates with walker(generalized weakness vs lack of coordination-muscle strength 5/5 x4 limbs), mild right facial droop. S/p Neurology consult. No change in tx.

## 2013-12-24 NOTE — Assessment & Plan Note (Signed)
low mentation with confusion and body movements. CT head 12/02/13 showed chronic left external capsule infarct. Continue SNF for care.

## 2013-12-24 NOTE — Assessment & Plan Note (Signed)
Takes Levothyroxine 50mcg, TSH 2.35 07/07/13 and 1.761 12/01/13    

## 2013-12-24 NOTE — Assessment & Plan Note (Signed)
Takes Tamsulosin. No urinary retention.    

## 2013-12-24 NOTE — Assessment & Plan Note (Signed)
Hgb 11.5 12/01/13 

## 2014-01-16 ENCOUNTER — Encounter: Payer: Self-pay | Admitting: Nurse Practitioner

## 2014-01-16 ENCOUNTER — Non-Acute Institutional Stay (SKILLED_NURSING_FACILITY): Payer: Medicare Other | Admitting: Nurse Practitioner

## 2014-01-16 DIAGNOSIS — E039 Hypothyroidism, unspecified: Secondary | ICD-10-CM

## 2014-01-16 DIAGNOSIS — Z96659 Presence of unspecified artificial knee joint: Secondary | ICD-10-CM

## 2014-01-16 DIAGNOSIS — F4323 Adjustment disorder with mixed anxiety and depressed mood: Secondary | ICD-10-CM

## 2014-01-16 DIAGNOSIS — D62 Acute posthemorrhagic anemia: Secondary | ICD-10-CM

## 2014-01-16 DIAGNOSIS — I10 Essential (primary) hypertension: Secondary | ICD-10-CM

## 2014-01-16 DIAGNOSIS — R413 Other amnesia: Secondary | ICD-10-CM

## 2014-01-16 DIAGNOSIS — I69919 Unspecified symptoms and signs involving cognitive functions following unspecified cerebrovascular disease: Secondary | ICD-10-CM

## 2014-01-16 DIAGNOSIS — K5900 Constipation, unspecified: Secondary | ICD-10-CM

## 2014-01-16 DIAGNOSIS — R634 Abnormal weight loss: Secondary | ICD-10-CM

## 2014-01-16 DIAGNOSIS — I2510 Atherosclerotic heart disease of native coronary artery without angina pectoris: Secondary | ICD-10-CM

## 2014-01-16 DIAGNOSIS — N400 Enlarged prostate without lower urinary tract symptoms: Secondary | ICD-10-CM

## 2014-01-16 DIAGNOSIS — I69319 Unspecified symptoms and signs involving cognitive functions following cerebral infarction: Secondary | ICD-10-CM

## 2014-01-16 NOTE — Assessment & Plan Note (Signed)
Gradual. Continue to monitor weight

## 2014-01-16 NOTE — Assessment & Plan Note (Signed)
S/p CABG >20 years. No c/o angina or chest pressures. Takes ASA   

## 2014-01-16 NOTE — Assessment & Plan Note (Signed)
slow mentation with confusion and body movements. CT head 12/02/13 showed chronic left external capsule infarct. Continue SNF for care.   

## 2014-01-16 NOTE — Assessment & Plan Note (Signed)
Controlled.  

## 2014-01-16 NOTE — Assessment & Plan Note (Signed)
Pain is managed with prn Tylenol and Oxycodone.

## 2014-01-16 NOTE — Assessment & Plan Note (Signed)
Takes Levothyroxine 50mcg, TSH 2.35 07/07/13 and 1.761 12/01/13    

## 2014-01-16 NOTE — Assessment & Plan Note (Signed)
Hgb 11.5 12/01/13 

## 2014-01-16 NOTE — Assessment & Plan Note (Signed)
MMSE 25/30, repeat memory test pending. 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

## 2014-01-16 NOTE — Assessment & Plan Note (Signed)
Hx of anxiety and Ativan use. Better with Sertraline 50mg  daily and Lorazepam 0.5mg  q4hr prn

## 2014-01-16 NOTE — Assessment & Plan Note (Signed)
Takes Tamsulosin. No urinary retention.    

## 2014-01-16 NOTE — Assessment & Plan Note (Signed)
Stable with Senna II qd  along with MiraLax dailly, Colace 100mg bid,  and prn Bisacodyl suppository prn   

## 2014-01-16 NOTE — Progress Notes (Signed)
Patient ID: William Roach, male   DOB: Aug 13, 1921, 80 y.o.   MRN: [id number]   Code Status: DNR  No Known Allergies  Chief Complaint  Patient presents with  . Medical Managment of Chronic Issues    HPI: Patient is a 80 y.o. male seen in the SNF at [facility] today for evaluation of chronic medical conditions.  Problem List Items Addressed This Visit   Acute blood loss anemia - Primary     Hgb 11.5 12/01/13      Adjustment disorder with mixed anxiety and depressed mood     Hx of anxiety and Ativan use. Better with Sertraline 50mg  daily and Lorazepam 0.5mg  q4hr prn       BPH (benign prostatic hyperplasia)     Takes Tamsulosin. No urinary retention.     CAD (coronary artery disease)     S/p CABG >20 years. No c/o angina or chest pressures. Takes ASA      CVA, old, cognitive deficits     slow mentation with confusion and body movements. CT head 12/02/13 showed chronic left external capsule infarct. Continue SNF for care.       HTN (hypertension)     Controlled       Loss of weight     Gradual. Continue to monitor weight    Memory deficit     MMSE 25/30, repeat memory test pending. 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       S/P knee replacement     Pain is managed with prn Tylenol and Oxycodone.     Unspecified constipation     Stable with Senna II qd  along with MiraLax dailly, Colace 100mg  bid,  and prn Bisacodyl suppository prn     Unspecified hypothyroidism     Takes Levothyroxine 36mcg, TSH 2.35 07/07/13 and 1.761 12/01/13         Review of Systems:  Review of Systems  Constitutional: Negative for fever, chills, weight loss and malaise/fatigue.       Generalized weakness-no longer ambulates with walker and difficulty transferring self  HENT: Negative for congestion, ear discharge, ear pain, hearing loss, nosebleeds, sore throat and tinnitus.   Eyes: Negative for blurred vision, double vision, photophobia, pain, discharge and redness.    Respiratory: Negative for cough, hemoptysis, sputum production, shortness of breath, wheezing and stridor.   Cardiovascular: Positive for leg swelling. Negative for chest pain, palpitations, orthopnea, claudication and PND.       Trace LLE  Gastrointestinal: Negative for heartburn, nausea, vomiting, abdominal pain, diarrhea, constipation, blood in stool and melena.  Genitourinary: Negative for dysuria, urgency, frequency, hematuria and flank pain.  Musculoskeletal: Positive for joint pain. Negative for back pain, falls, myalgias and neck pain.       Left knee-much better.   Skin: Negative for itching and rash.       The left knee surgical incision is healed. Mild fever and erythema still presents  Neurological: Positive for sensory change and weakness. Negative for dizziness, tingling, tremors, speech change, focal weakness, seizures, loss of consciousness and headaches.       Numbness in BLE/feet in am-better once he is up. Difficulty ambulates with walker independently  Endo/Heme/Allergies: Negative for environmental allergies and polydipsia. Does not bruise/bleed easily.  Psychiatric/Behavioral: Positive for depression and memory loss. Negative for suicidal ideas, hallucinations and substance abuse. The patient is nervous/anxious. The patient does not have insomnia.        Sad facial looks-better/brighter  Past Medical History  Diagnosis Date  . Coronary artery disease     post CABG x4   . Hypertension   . Hypercholesterolemia   . Fatigue   . Dizziness   . Basal cell carcinoma, ear     post -- partial pinnectomy left side with primary closure    . Hypothyroidism   . History of kidney stones     x1   Past Surgical History  Procedure Laterality Date  . Coronary artery bypass graft  2003    x4 --   . Cholecystectomy  2002  . Inguinal hernia repair  09/19/2007  . Ear cyst excision  12/14/2009    Excision of left auricular cyst with primary closure  . Basal cell carcinoma  excision  01/14/2010    Wedge resection, partial pinnectomy left side with primary closure      . Tonsillectomy    . Carpal tunnel release    . Cataract surgery Bilateral 06-24-13  . Tonsillectomy    . Cystoscopy    . Total knee arthroplasty Left 06/27/2013    Procedure: LEFT TOTAL KNEE ARTHROPLASTY;  Surgeon: Tobi Bastos, MD;  Location: [facility];  Service: Orthopedics;  Laterality: Left;   Social History:   reports that he quit smoking about 32 years ago. He does not have any smokeless tobacco history on file. He reports that he does not drink alcohol or use illicit drugs.  Family History  Problem Relation Age of Onset  . Parkinsonism Father   . Angina Mother   . Coronary artery disease Brother     with CABG    Medications: Patient's Medications  New Prescriptions   No medications on file  Previous Medications   BISACODYL (DULCOLAX) 10 MG SUPPOSITORY    Place 1 suppository (10 mg total) rectally daily as needed.   LEVOTHYROXINE (SYNTHROID, LEVOTHROID) 50 MCG TABLET    Take 50 mcg by mouth daily before breakfast.    LORAZEPAM (ATIVAN) 0.5 MG TABLET    0.5 mg every 4 (four) hours as needed.    METHOCARBAMOL (ROBAXIN) 500 MG TABLET    Take 1 tablet (500 mg total) by mouth every 6 (six) hours as needed.   OXYCODONE (OXY IR/ROXICODONE) 5 MG IMMEDIATE RELEASE TABLET    Take 1-2 tablets (5-10 mg total) by mouth every 3 (three) hours as needed.   POLYETHYLENE GLYCOL (MIRALAX / GLYCOLAX) PACKET    Take 17 g by mouth daily as needed.   SENNA (SENOKOT) 8.6 MG TABLET    Take 2 tablets by mouth daily.    SERTRALINE (ZOLOFT) 50 MG TABLET    Take 50 mg by mouth daily.   TAMSULOSIN (FLOMAX) 0.4 MG CAPS CAPSULE    Take 1 capsule (0.4 mg total) by mouth daily.  Modified Medications   No medications on file  Discontinued Medications   No medications on file     Physical Exam: Physical Exam  Constitutional: He is oriented to person, place, and time. He appears well-developed and  well-nourished. No distress.  generalized weakness. No longer transferring self and ambulates with walker.   HENT:  Head: Normocephalic and atraumatic.  Right Ear: External ear normal.  Left Ear: External ear normal.  Nose: Nose normal.  Mouth/Throat: Oropharynx is clear and moist. No oropharyngeal exudate.  Eyes: Conjunctivae and EOM are normal. Pupils are equal, round, and reactive to light. Right eye exhibits no discharge. Left eye exhibits no discharge. No scleral icterus.  Neck: Normal range of motion. Neck  supple. No JVD present. No tracheal deviation present. No thyromegaly present.  Cardiovascular: Normal rate, regular rhythm, normal heart sounds and intact distal pulses.   No murmur heard. Pulmonary/Chest: Effort normal. No stridor. No respiratory distress. He has no wheezes. He has rales. He exhibits no tenderness.  Dry rales bases.   Abdominal: Soft. Bowel sounds are normal. He exhibits no distension. There is no tenderness. There is no rebound and no guarding.  Genitourinary: Penis normal.  Musculoskeletal: Normal range of motion. He exhibits edema and tenderness.   Left knee mild swelling and warmth with limited flexion and extension ROM.   Lymphadenopathy:    He has no cervical adenopathy.  Neurological: He is alert and oriented to person, place, and time. He has normal reflexes. No cranial nerve deficit. He exhibits normal muscle tone. Coordination abnormal.  Decreased coordination of the RUE-improving.   Skin: Skin is warm and dry. No rash noted. He is not diaphoretic. There is erythema. No pallor.  The left knee surgical incision is healed. Mild erythema Left knee.   Psychiatric: His speech is normal. Judgment and thought content normal. His mood appears anxious. He is slowed and withdrawn. Cognition and memory are impaired. He exhibits abnormal recent memory.  Flat affect-improved.     Filed Vitals:   01/16/14 1339  BP: 118/88  Pulse: 76  Temp: 99.2 F (37.3 C)    TempSrc: Tympanic  Resp: 20      Labs reviewed: Basic Metabolic Panel:  Recent Labs  06/24/13 1430 06/28/13 0541 06/29/13 0540  07/07/13 10/13/13 10/29/13 12/01/13  NA 136 136 135  < > 138 142 138 141  K 4.9 4.1 3.7  --  3.8 4.4 3.9 4.3  CL 101 103 102  --   --   --   --   --   CO2 26 26 24   --   --   --   --   --   GLUCOSE 97 115* 121*  --   --   --   --   --   BUN 31* 19 22  < > 20 26* 19 29*  CREATININE 0.98 0.83 0.81  < > 0.7 0.8 0.7 0.7  CALCIUM 9.7 8.6 8.5  --   --   --   --   --   TSH  --   --   --   --  2.35  --   --   --   < > = values in this interval not displayed. Liver Function Tests:  Recent Labs  04/09/13 1030 05/14/13 0927 06/24/13 1430 07/07/13 10/29/13 12/01/13  AST 83* 33 30 19 14  13*  ALT 137* 40 22 19 9* 9*  ALKPHOS 76 71 87 114 98 149*  BILITOT 1.0 1.1 0.6  --   --   --   PROT 6.6 6.3 6.7  --   --   --   ALBUMIN 4.1 3.9 3.9  --   --   --    CBC:  Recent Labs  06/29/13 0540 06/30/13 0425 06/30/13 1515 07/01/13 0428  10/13/13 10/29/13 12/01/13  WBC 12.9* 9.7  --  9.3  < > 8.0 11.7 10.8  NEUTROABS  --   --   --  7.0  --   --   --   --   HGB 9.6* 8.3* 8.6* 8.1*  < > 14.6 12.8* 11.5*  HCT 28.2* 24.9* 24.9* 23.5*  < > 43 38* 34*  MCV 93.1 92.6  --  92.5  --   --   --   --   PLT 113* 92*  --  117*  < > 192 199 327  < > = values in this interval not displayed. Lipid Panel:  Recent Labs  04/09/13 1030  CHOL 138  HDL 40.10  LDLCALC 69  TRIG 144.0  CHOLHDL 3    Past Procedures:  10/28/13 CXR minimal cardiomegaly wihtout pulmonary vascular congestion, no pleural effusion, patchy bibasilar pneumonitis.   12/02/13 CT with and without contrast: atrophy and chronic left external capsule infarct. No acute abnormality.   Assessment/Plan Acute blood loss anemia Hgb 11.5 12/01/13    Adjustment disorder with mixed anxiety and depressed mood Hx of anxiety and Ativan use. Better with Sertraline 50mg  daily and Lorazepam 0.5mg  q4hr  prn     BPH (benign prostatic hyperplasia) Takes Tamsulosin. No urinary retention.   Unspecified constipation Stable with Senna II qd  along with MiraLax dailly, Colace 100mg  bid,  and prn Bisacodyl suppository prn   Unspecified hypothyroidism Takes Levothyroxine 49mcg, TSH 2.35 07/07/13 and 1.761 12/01/13    S/P knee replacement Pain is managed with prn Tylenol and Oxycodone.   Loss of weight Gradual. Continue to monitor weight  Memory deficit MMSE 25/30, repeat memory test pending. 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     CVA, old, cognitive deficits slow mentation with confusion and body movements. CT head 12/02/13 showed chronic left external capsule infarct. Continue SNF for care.     CAD (coronary artery disease) S/p CABG >20 years. No c/o angina or chest pressures. Takes ASA    HTN (hypertension) Controlled       Family/ Staff Communication: observe the patient.   Goals of Care: SNF  Labs/tests ordered: none

## 2014-01-20 ENCOUNTER — Encounter: Payer: Self-pay | Admitting: Nurse Practitioner

## 2014-01-20 ENCOUNTER — Non-Acute Institutional Stay (SKILLED_NURSING_FACILITY): Payer: Medicare Other | Admitting: Nurse Practitioner

## 2014-01-20 DIAGNOSIS — R413 Other amnesia: Secondary | ICD-10-CM

## 2014-01-20 DIAGNOSIS — N400 Enlarged prostate without lower urinary tract symptoms: Secondary | ICD-10-CM

## 2014-01-20 DIAGNOSIS — M1712 Unilateral primary osteoarthritis, left knee: Secondary | ICD-10-CM

## 2014-01-20 DIAGNOSIS — K5900 Constipation, unspecified: Secondary | ICD-10-CM

## 2014-01-20 DIAGNOSIS — E039 Hypothyroidism, unspecified: Secondary | ICD-10-CM

## 2014-01-20 DIAGNOSIS — I10 Essential (primary) hypertension: Secondary | ICD-10-CM

## 2014-01-20 DIAGNOSIS — M1710 Unilateral primary osteoarthritis, unspecified knee: Secondary | ICD-10-CM

## 2014-01-20 DIAGNOSIS — IMO0002 Reserved for concepts with insufficient information to code with codable children: Secondary | ICD-10-CM

## 2014-01-20 DIAGNOSIS — I2510 Atherosclerotic heart disease of native coronary artery without angina pectoris: Secondary | ICD-10-CM

## 2014-01-20 DIAGNOSIS — F4323 Adjustment disorder with mixed anxiety and depressed mood: Secondary | ICD-10-CM

## 2014-01-20 NOTE — Assessment & Plan Note (Signed)
S/p CABG >20 years. No c/o angina or chest pressures. Takes ASA

## 2014-01-20 NOTE — Assessment & Plan Note (Signed)
Takes Levothyroxine 50mcg, TSH 2.35 07/07/13 and 1.761 12/01/13    

## 2014-01-20 NOTE — Assessment & Plan Note (Signed)
Hx of anxiety and Ativan use. Better with Sertraline 50mg daily and Lorazepam 0.5mg q4hr prn    

## 2014-01-20 NOTE — Assessment & Plan Note (Addendum)
Low measurements. Asymptomatic. Not taking antihypertensive agent. May have to dc Tamsulosin if he becomes symptomatic of hypotension.

## 2014-01-20 NOTE — Assessment & Plan Note (Signed)
S/p TKR. Still c/o pain. Unable to extend left knee fully. Mild swelling and erythema is his new baseline. Prn Tylenol and Oxycodone available to him.

## 2014-01-20 NOTE — Progress Notes (Signed)
Patient ID: William Roach, male   DOB: [birth year], 80 y.o.   MRN: [id number]   Code Status: DNR  No Known Allergies  Chief Complaint  Patient presents with  . Medical Management of Chronic Issues  . Acute Visit    memroy    HPI: Patient is a 80 y.o. male seen in the SNF at [facility] today for evaluation of memory and chronic medical conditions.  Problem List Items Addressed This Visit   Adjustment disorder with mixed anxiety and depressed mood - Primary     Hx of anxiety and Ativan use. Better with Sertraline 50mg  daily and Lorazepam 0.5mg  q4hr prn      BPH (benign prostatic hyperplasia)     Takes Tamsulosin. No urinary retention.      CAD (coronary artery disease)     S/p CABG >20 years. No c/o angina or chest pressures. Takes ASA     HTN (hypertension)     Low measurements. Asymptomatic. Not taking antihypertensive agent. May have to dc Tamsulosin if he becomes symptomatic of hypotension.     Memory deficit     Likely vascular etiology. SLUMS 10/30 12/01/13. Will start Namenda starter PK-then 28mg  po daily. Observe the patient.     Osteoarthritis of left knee     S/p TKR. Still c/o pain. Unable to extend left knee fully. Mild swelling and erythema is his new baseline. Prn Tylenol and Oxycodone available to him.     Unspecified constipation     Stable with Senna II qd  along with MiraLax dailly, Colace 100mg  bid,  and prn Bisacodyl suppository prn     Unspecified hypothyroidism     Takes Levothyroxine 72mcg, TSH 2.35 07/07/13 and 1.761 12/01/13         Review of Systems:  Review of Systems  Constitutional: Negative for fever, chills, weight loss and malaise/fatigue.       Generalized weakness-no longer ambulates with walker and difficulty transferring self  HENT: Negative for congestion, ear discharge, ear pain, hearing loss, nosebleeds, sore throat and tinnitus.   Eyes: Negative for blurred vision, double vision, photophobia, pain, discharge and redness.    Respiratory: Negative for cough, hemoptysis, sputum production, shortness of breath, wheezing and stridor.   Cardiovascular: Positive for leg swelling. Negative for chest pain, palpitations, orthopnea, claudication and PND.       Trace LLE  Gastrointestinal: Negative for heartburn, nausea, vomiting, abdominal pain, diarrhea, constipation, blood in stool and melena.  Genitourinary: Negative for dysuria, urgency, frequency, hematuria and flank pain.  Musculoskeletal: Positive for joint pain. Negative for back pain, falls, myalgias and neck pain.       Left knee-much better.   Skin: Negative for itching and rash.       The left knee surgical incision is healed. Mild fever and erythema still presents  Neurological: Positive for sensory change and weakness. Negative for dizziness, tingling, tremors, speech change, focal weakness, seizures, loss of consciousness and headaches.       Numbness in BLE/feet in am-better once he is up. Difficulty ambulates with walker independently  Endo/Heme/Allergies: Negative for environmental allergies and polydipsia. Does not bruise/bleed easily.  Psychiatric/Behavioral: Positive for depression and memory loss. Negative for suicidal ideas, hallucinations and substance abuse. The patient is nervous/anxious. The patient does not have insomnia.        Sad facial looks-better/brighter     Past Medical History  Diagnosis Date  . Coronary artery disease     post CABG x4   .  Hypertension   . Hypercholesterolemia   . Fatigue   . Dizziness   . Basal cell carcinoma, ear     post -- partial pinnectomy left side with primary closure    . Hypothyroidism   . History of kidney stones     x1   Past Surgical History  Procedure Laterality Date  . Coronary artery bypass graft  2003    x4 --   . Cholecystectomy  2002  . Inguinal hernia repair  09/19/2007  . Ear cyst excision  12/14/2009    Excision of left auricular cyst with primary closure  . Basal cell carcinoma  excision  01/14/2010    Wedge resection, partial pinnectomy left side with primary closure      . Tonsillectomy    . Carpal tunnel release    . Cataract surgery Bilateral 06-24-13  . Tonsillectomy    . Cystoscopy    . Total knee arthroplasty Left 06/27/2013    Procedure: LEFT TOTAL KNEE ARTHROPLASTY;  Surgeon: Tobi Bastos, MD;  Location: [facility];  Service: Orthopedics;  Laterality: Left;   Social History:   reports that he quit smoking about 32 years ago. He does not have any smokeless tobacco history on file. He reports that he does not drink alcohol or use illicit drugs.  Family History  Problem Relation Age of Onset  . Parkinsonism Father   . Angina Mother   . Coronary artery disease Brother     with CABG    Medications: Patient's Medications  New Prescriptions   No medications on file  Previous Medications   BISACODYL (DULCOLAX) 10 MG SUPPOSITORY    Place 1 suppository (10 mg total) rectally daily as needed.   LEVOTHYROXINE (SYNTHROID, LEVOTHROID) 50 MCG TABLET    Take 50 mcg by mouth daily before breakfast.    LORAZEPAM (ATIVAN) 0.5 MG TABLET    0.5 mg every 4 (four) hours as needed.    MEMANTINE HCL ER (NAMENDA XR) 28 MG CP24    Take by mouth.   METHOCARBAMOL (ROBAXIN) 500 MG TABLET    Take 1 tablet (500 mg total) by mouth every 6 (six) hours as needed.   OXYCODONE (OXY IR/ROXICODONE) 5 MG IMMEDIATE RELEASE TABLET    Take 1-2 tablets (5-10 mg total) by mouth every 3 (three) hours as needed.   POLYETHYLENE GLYCOL (MIRALAX / GLYCOLAX) PACKET    Take 17 g by mouth daily as needed.   SENNA (SENOKOT) 8.6 MG TABLET    Take 2 tablets by mouth daily.    SERTRALINE (ZOLOFT) 50 MG TABLET    Take 50 mg by mouth daily.   TAMSULOSIN (FLOMAX) 0.4 MG CAPS CAPSULE    Take 1 capsule (0.4 mg total) by mouth daily.  Modified Medications   No medications on file  Discontinued Medications   No medications on file     Physical Exam: Physical Exam  Constitutional: He is oriented to  person, place, and time. He appears well-developed and well-nourished. No distress.  generalized weakness. No longer transferring self and ambulates with walker.   HENT:  Head: Normocephalic and atraumatic.  Right Ear: External ear normal.  Left Ear: External ear normal.  Nose: Nose normal.  Mouth/Throat: Oropharynx is clear and moist. No oropharyngeal exudate.  Eyes: Conjunctivae and EOM are normal. Pupils are equal, round, and reactive to light. Right eye exhibits no discharge. Left eye exhibits no discharge. No scleral icterus.  Neck: Normal range of motion. Neck supple. No JVD present. No  tracheal deviation present. No thyromegaly present.  Cardiovascular: Normal rate, regular rhythm, normal heart sounds and intact distal pulses.   No murmur heard. Pulmonary/Chest: Effort normal. No stridor. No respiratory distress. He has no wheezes. He has rales. He exhibits no tenderness.  Dry rales bases.   Abdominal: Soft. Bowel sounds are normal. He exhibits no distension. There is no tenderness. There is no rebound and no guarding.  Genitourinary: Penis normal.  Musculoskeletal: Normal range of motion. He exhibits edema and tenderness.   Left knee mild swelling and warmth with limited flexion and extension ROM.   Lymphadenopathy:    He has no cervical adenopathy.  Neurological: He is alert and oriented to person, place, and time. He has normal reflexes. No cranial nerve deficit. He exhibits normal muscle tone. Coordination abnormal.  Decreased coordination of the RUE-improving.   Skin: Skin is warm and dry. No rash noted. He is not diaphoretic. There is erythema. No pallor.  The left knee surgical incision is healed. Mild erythema Left knee.   Psychiatric: His speech is normal. Judgment and thought content normal. His mood appears anxious. He is slowed and withdrawn. Cognition and memory are impaired. He exhibits abnormal recent memory.  Flat affect-improved.     Filed Vitals:   01/20/14 1114    BP: 107/52  Pulse: 67  Temp: 98.4 F (36.9 C)  TempSrc: Tympanic  Resp: 18      Labs reviewed: Basic Metabolic Panel:  Recent Labs  06/24/13 1430 06/28/13 0541 06/29/13 0540  07/07/13 10/13/13 10/29/13 12/01/13  NA 136 136 135  < > 138 142 138 141  K 4.9 4.1 3.7  --  3.8 4.4 3.9 4.3  CL 101 103 102  --   --   --   --   --   CO2 26 26 24   --   --   --   --   --   GLUCOSE 97 115* 121*  --   --   --   --   --   BUN 31* 19 22  < > 20 26* 19 29*  CREATININE 0.98 0.83 0.81  < > 0.7 0.8 0.7 0.7  CALCIUM 9.7 8.6 8.5  --   --   --   --   --   TSH  --   --   --   --  2.35  --   --   --   < > = values in this interval not displayed. Liver Function Tests:  Recent Labs  04/09/13 1030 05/14/13 0927 06/24/13 1430 07/07/13 10/29/13 12/01/13  AST 83* 33 30 19 14  13*  ALT 137* 40 22 19 9* 9*  ALKPHOS 76 71 87 114 98 149*  BILITOT 1.0 1.1 0.6  --   --   --   PROT 6.6 6.3 6.7  --   --   --   ALBUMIN 4.1 3.9 3.9  --   --   --    CBC:  Recent Labs  06/29/13 0540 06/30/13 0425 06/30/13 1515 07/01/13 0428  10/13/13 10/29/13 12/01/13  WBC 12.9* 9.7  --  9.3  < > 8.0 11.7 10.8  NEUTROABS  --   --   --  7.0  --   --   --   --   HGB 9.6* 8.3* 8.6* 8.1*  < > 14.6 12.8* 11.5*  HCT 28.2* 24.9* 24.9* 23.5*  < > 43 38* 34*  MCV 93.1 92.6  --  92.5  --   --   --   --  PLT 113* 92*  --  117*  < > 192 199 327  < > = values in this interval not displayed. Lipid Panel:  Recent Labs  04/09/13 1030  CHOL 138  HDL 40.10  LDLCALC 69  TRIG 144.0  CHOLHDL 3    Past Procedures:  10/28/13 CXR minimal cardiomegaly wihtout pulmonary vascular congestion, no pleural effusion, patchy bibasilar pneumonitis.   12/02/13 CT with and without contrast: atrophy and chronic left external capsule infarct. No acute abnormality.   Assessment/Plan Adjustment disorder with mixed anxiety and depressed mood Hx of anxiety and Ativan use. Better with Sertraline 50mg  daily and Lorazepam 0.5mg  q4hr  prn    BPH (benign prostatic hyperplasia) Takes Tamsulosin. No urinary retention.    CAD (coronary artery disease) S/p CABG >20 years. No c/o angina or chest pressures. Takes ASA   HTN (hypertension) Low measurements. Asymptomatic. Not taking antihypertensive agent. May have to dc Tamsulosin if he becomes symptomatic of hypotension.   Memory deficit Likely vascular etiology. SLUMS 10/30 12/01/13. Will start Namenda starter PK-then 28mg  po daily. Observe the patient.   Osteoarthritis of left knee S/p TKR. Still c/o pain. Unable to extend left knee fully. Mild swelling and erythema is his new baseline. Prn Tylenol and Oxycodone available to him.   Unspecified constipation Stable with Senna II qd  along with MiraLax dailly, Colace 100mg  bid,  and prn Bisacodyl suppository prn   Unspecified hypothyroidism Takes Levothyroxine 39mcg, TSH 2.35 07/07/13 and 1.761 12/01/13      Family/ Staff Communication: observe the patient.   Goals of Care: SNF  Labs/tests ordered: none

## 2014-01-20 NOTE — Assessment & Plan Note (Signed)
Likely vascular etiology. SLUMS 10/30 12/01/13. Will start Namenda starter PK-then 28mg  po daily. Observe the patient.

## 2014-01-20 NOTE — Assessment & Plan Note (Signed)
Takes Tamsulosin. No urinary retention.    

## 2014-01-20 NOTE — Assessment & Plan Note (Signed)
Stable with Senna II qd  along with MiraLax dailly, Colace 100mg bid,  and prn Bisacodyl suppository prn   

## 2014-02-03 ENCOUNTER — Encounter: Payer: Self-pay | Admitting: Nurse Practitioner

## 2014-02-03 ENCOUNTER — Non-Acute Institutional Stay (SKILLED_NURSING_FACILITY): Payer: Medicare Other | Admitting: Nurse Practitioner

## 2014-02-03 DIAGNOSIS — N400 Enlarged prostate without lower urinary tract symptoms: Secondary | ICD-10-CM

## 2014-02-03 DIAGNOSIS — I10 Essential (primary) hypertension: Secondary | ICD-10-CM

## 2014-02-03 DIAGNOSIS — E039 Hypothyroidism, unspecified: Secondary | ICD-10-CM

## 2014-02-03 DIAGNOSIS — D62 Acute posthemorrhagic anemia: Secondary | ICD-10-CM

## 2014-02-03 DIAGNOSIS — IMO0002 Reserved for concepts with insufficient information to code with codable children: Secondary | ICD-10-CM

## 2014-02-03 DIAGNOSIS — I2510 Atherosclerotic heart disease of native coronary artery without angina pectoris: Secondary | ICD-10-CM

## 2014-02-03 DIAGNOSIS — R413 Other amnesia: Secondary | ICD-10-CM

## 2014-02-03 DIAGNOSIS — K5900 Constipation, unspecified: Secondary | ICD-10-CM

## 2014-02-03 DIAGNOSIS — M1712 Unilateral primary osteoarthritis, left knee: Secondary | ICD-10-CM

## 2014-02-03 DIAGNOSIS — R634 Abnormal weight loss: Secondary | ICD-10-CM

## 2014-02-03 DIAGNOSIS — F4323 Adjustment disorder with mixed anxiety and depressed mood: Secondary | ICD-10-CM

## 2014-02-03 DIAGNOSIS — M1710 Unilateral primary osteoarthritis, unspecified knee: Secondary | ICD-10-CM

## 2014-02-03 NOTE — Assessment & Plan Note (Signed)
Takes Tamsulosin. No urinary retention.    

## 2014-02-03 NOTE — Assessment & Plan Note (Signed)
Stabilized.  

## 2014-02-03 NOTE — Assessment & Plan Note (Signed)
/  p TKR. Still c/o pain. Unable to extend left knee fully. Mild swelling and erythema is his new baseline. Prn Tylenol and Oxycodone available to him.

## 2014-02-03 NOTE — Assessment & Plan Note (Addendum)
Hx of anxiety and Ativan use. Better with Sertraline 50mg  daily and Lorazepam 0.5mg  q4hr prn, but he still exhibits anxious and restless movements especially in pm. Will increase Sertraline to 75mg  for better mood management and Depakote 250mg  daily to stabilize mood.

## 2014-02-03 NOTE — Assessment & Plan Note (Signed)
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 Namenda started and tolerated.   

## 2014-02-03 NOTE — Assessment & Plan Note (Signed)
/  p CABG >20 years. No c/o angina or chest pressures. Takes ASA 325mg  daily

## 2014-02-03 NOTE — Progress Notes (Signed)
Patient ID: William Roach, male   DOB: 12-09-1920, 80 y.o.   MRN: [id number]   Code Status: DNR  No Known Allergies  Chief Complaint  Patient presents with  . Medical Management of Chronic Issues  . Acute Visit    anxiety and restlessness.     HPI: Patient is a 80 y.o. male seen in the SNF at [facility] today for evaluation of anxiety, restlessness, and chronic medical conditions.  Problem List Items Addressed This Visit   CAD (coronary artery disease)     /p CABG >20 years. No c/o angina or chest pressures. Takes ASA 325mg  daily      HTN (hypertension)     Controlled. Off antihypertensive agent.     Osteoarthritis of left knee     /p TKR. Still c/o pain. Unable to extend left knee fully. Mild swelling and erythema is his new baseline. Prn Tylenol and Oxycodone available to him.     Acute blood loss anemia     Hgb 11.5 12/01/13      BPH (benign prostatic hyperplasia)     Takes Tamsulosin. No urinary retention.       Unspecified constipation     Stable with Senna II qd  along with MiraLax dailly, Colace 100mg  bid,  and prn Bisacodyl suppository prn      Unspecified hypothyroidism     Takes Levothyroxine 61mcg, TSH 2.35 07/07/13 and 1.761 12/01/13       Adjustment disorder with mixed anxiety and depressed mood - Primary     Hx of anxiety and Ativan use. Better with Sertraline 50mg  daily and Lorazepam 0.5mg  q4hr prn, but he still exhibits anxious and restless movements especially in pm. Will increase Sertraline to 75mg  for better mood management and Depakote 250mg  daily to stabilize mood.        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 Namenda started and tolerated.        Loss of weight     Stabilized.        Review of Systems:  Review of Systems  Constitutional: Negative for fever, chills, weight loss and malaise/fatigue.       Generalized weakness-no longer ambulates with walker and difficulty transferring  self  HENT: Negative for congestion, ear discharge, ear pain, hearing loss, nosebleeds, sore throat and tinnitus.   Eyes: Negative for blurred vision, double vision, photophobia, pain, discharge and redness.  Respiratory: Negative for cough, hemoptysis, sputum production, shortness of breath, wheezing and stridor.   Cardiovascular: Positive for leg swelling. Negative for chest pain, palpitations, orthopnea, claudication and PND.       Trace LLE  Gastrointestinal: Negative for heartburn, nausea, vomiting, abdominal pain, diarrhea, constipation, blood in stool and melena.  Genitourinary: Negative for dysuria, urgency, frequency, hematuria and flank pain.  Musculoskeletal: Positive for joint pain. Negative for back pain, falls, myalgias and neck pain.       Left knee-much better.   Skin: Negative for itching and rash.       The left knee surgical incision is healed. Mild fever and erythema still presents  Neurological: Positive for sensory change and weakness. Negative for dizziness, tingling, tremors, speech change, focal weakness, seizures, loss of consciousness and headaches.       Numbness in BLE/feet in am-better once he is up. Difficulty ambulates with walker independently  Endo/Heme/Allergies: Negative for environmental allergies and polydipsia. Does not bruise/bleed easily.  Psychiatric/Behavioral: Positive for depression and memory loss. Negative  for suicidal ideas, hallucinations and substance abuse. The patient is nervous/anxious. The patient does not have insomnia.        Sad facial looks-better/brighter     Past Medical History  Diagnosis Date  . Coronary artery disease     post CABG x4   . Hypertension   . Hypercholesterolemia   . Fatigue   . Dizziness   . Basal cell carcinoma, ear     post -- partial pinnectomy left side with primary closure    . Hypothyroidism   . History of kidney stones     x1   Past Surgical History  Procedure Laterality Date  . Coronary artery  bypass graft  2003    x4 --   . Cholecystectomy  2002  . Inguinal hernia repair  09/19/2007  . Ear cyst excision  12/14/2009    Excision of left auricular cyst with primary closure  . Basal cell carcinoma excision  01/14/2010    Wedge resection, partial pinnectomy left side with primary closure      . Tonsillectomy    . Carpal tunnel release    . Cataract surgery Bilateral 06-24-13  . Tonsillectomy    . Cystoscopy    . Total knee arthroplasty Left 06/27/2013    Procedure: LEFT TOTAL KNEE ARTHROPLASTY;  Surgeon: Tobi Bastos, MD;  Location: [facility];  Service: Orthopedics;  Laterality: Left;   Social History:   reports that he quit smoking about 32 years ago. He does not have any smokeless tobacco history on file. He reports that he does not drink alcohol or use illicit drugs.  Family History  Problem Relation Age of Onset  . Parkinsonism Father   . Angina Mother   . Coronary artery disease Brother     with CABG    Medications: Patient's Medications  New Prescriptions   No medications on file  Previous Medications   BISACODYL (DULCOLAX) 10 MG SUPPOSITORY    Place 1 suppository (10 mg total) rectally daily as needed.   LEVOTHYROXINE (SYNTHROID, LEVOTHROID) 50 MCG TABLET    Take 50 mcg by mouth daily before breakfast.    LORAZEPAM (ATIVAN) 0.5 MG TABLET    0.5 mg every 4 (four) hours as needed.    MEMANTINE HCL ER (NAMENDA XR) 28 MG CP24    Take by mouth.   METHOCARBAMOL (ROBAXIN) 500 MG TABLET    Take 1 tablet (500 mg total) by mouth every 6 (six) hours as needed.   OXYCODONE (OXY IR/ROXICODONE) 5 MG IMMEDIATE RELEASE TABLET    Take 1-2 tablets (5-10 mg total) by mouth every 3 (three) hours as needed.   POLYETHYLENE GLYCOL (MIRALAX / GLYCOLAX) PACKET    Take 17 g by mouth daily as needed.   SENNA (SENOKOT) 8.6 MG TABLET    Take 2 tablets by mouth daily.    SERTRALINE (ZOLOFT) 25 MG TABLET    Take 75 mg by mouth daily.   TAMSULOSIN (FLOMAX) 0.4 MG CAPS CAPSULE    Take 1 capsule  (0.4 mg total) by mouth daily.  Modified Medications   No medications on file  Discontinued Medications   SERTRALINE (ZOLOFT) 50 MG TABLET    Take 50 mg by mouth daily.     Physical Exam: Physical Exam  Constitutional: He is oriented to person, place, and time. He appears well-developed and well-nourished. No distress.  generalized weakness. No longer transferring self and ambulates with walker.   HENT:  Head: Normocephalic and atraumatic.  Right Ear: External  ear normal.  Left Ear: External ear normal.  Nose: Nose normal.  Mouth/Throat: Oropharynx is clear and moist. No oropharyngeal exudate.  Eyes: Conjunctivae and EOM are normal. Pupils are equal, round, and reactive to light. Right eye exhibits no discharge. Left eye exhibits no discharge. No scleral icterus.  Neck: Normal range of motion. Neck supple. No JVD present. No tracheal deviation present. No thyromegaly present.  Cardiovascular: Normal rate, regular rhythm, normal heart sounds and intact distal pulses.   No murmur heard. Pulmonary/Chest: Effort normal. No stridor. No respiratory distress. He has no wheezes. He has rales. He exhibits no tenderness.  Dry rales bases.   Abdominal: Soft. Bowel sounds are normal. He exhibits no distension. There is no tenderness. There is no rebound and no guarding.  Genitourinary: Penis normal.  Musculoskeletal: Normal range of motion. He exhibits edema and tenderness.   Left knee mild swelling and warmth with limited flexion and extension ROM.   Lymphadenopathy:    He has no cervical adenopathy.  Neurological: He is alert and oriented to person, place, and time. He has normal reflexes. No cranial nerve deficit. He exhibits normal muscle tone. Coordination abnormal.  Decreased coordination of the RUE-improving.   Skin: Skin is warm and dry. No rash noted. He is not diaphoretic. There is erythema. No pallor.  The left knee surgical incision is healed. Mild erythema Left knee.   Psychiatric:  His speech is normal. Judgment and thought content normal. His mood appears anxious. He is slowed and withdrawn. Cognition and memory are impaired. He exhibits abnormal recent memory.  Flat affect-improved.     Filed Vitals:   02/03/14 1008  BP: 116/62  Pulse: 76  Temp: 96.7 F (35.9 C)  TempSrc: Tympanic  Resp: 20      Labs reviewed: Basic Metabolic Panel:  Recent Labs  06/24/13 1430 06/28/13 0541 06/29/13 0540  07/07/13 10/13/13 10/29/13 12/01/13  NA 136 136 135  < > 138 142 138 141  K 4.9 4.1 3.7  --  3.8 4.4 3.9 4.3  CL 101 103 102  --   --   --   --   --   CO2 26 26 24   --   --   --   --   --   GLUCOSE 97 115* 121*  --   --   --   --   --   BUN 31* 19 22  < > 20 26* 19 29*  CREATININE 0.98 0.83 0.81  < > 0.7 0.8 0.7 0.7  CALCIUM 9.7 8.6 8.5  --   --   --   --   --   TSH  --   --   --   --  2.35  --   --   --   < > = values in this interval not displayed. Liver Function Tests:  Recent Labs  04/09/13 1030 05/14/13 0927 06/24/13 1430 07/07/13 10/29/13 12/01/13  AST 83* 33 30 19 14  13*  ALT 137* 40 22 19 9* 9*  ALKPHOS 76 71 87 114 98 149*  BILITOT 1.0 1.1 0.6  --   --   --   PROT 6.6 6.3 6.7  --   --   --   ALBUMIN 4.1 3.9 3.9  --   --   --    CBC:  Recent Labs  06/29/13 0540 06/30/13 0425 06/30/13 1515 07/01/13 0428  10/13/13 10/29/13 12/01/13  WBC 12.9* 9.7  --  9.3  < > 8.0  11.7 10.8  NEUTROABS  --   --   --  7.0  --   --   --   --   HGB 9.6* 8.3* 8.6* 8.1*  < > 14.6 12.8* 11.5*  HCT 28.2* 24.9* 24.9* 23.5*  < > 43 38* 34*  MCV 93.1 92.6  --  92.5  --   --   --   --   PLT 113* 92*  --  117*  < > 192 199 327  < > = values in this interval not displayed. Lipid Panel:  Recent Labs  04/09/13 1030  CHOL 138  HDL 40.10  LDLCALC 69  TRIG 144.0  CHOLHDL 3    Past Procedures:  10/28/13 CXR minimal cardiomegaly wihtout pulmonary vascular congestion, no pleural effusion, patchy bibasilar pneumonitis.   12/02/13 CT with and without contrast:  atrophy and chronic left external capsule infarct. No acute abnormality.   Assessment/Plan CAD (coronary artery disease) /p CABG >20 years. No c/o angina or chest pressures. Takes ASA 325mg  daily    HTN (hypertension) Controlled. Off antihypertensive agent.   Osteoarthritis of left knee /p TKR. Still c/o pain. Unable to extend left knee fully. Mild swelling and erythema is his new baseline. Prn Tylenol and Oxycodone available to him.   Acute blood loss anemia Hgb 11.5 12/01/13    BPH (benign prostatic hyperplasia) Takes Tamsulosin. No urinary retention.     Unspecified constipation Stable with Senna II qd  along with MiraLax dailly, Colace 100mg  bid,  and prn Bisacodyl suppository prn    Unspecified hypothyroidism Takes Levothyroxine 16mcg, TSH 2.35 07/07/13 and 1.761 12/01/13     Adjustment disorder with mixed anxiety and depressed mood Hx of anxiety and Ativan use. Better with Sertraline 50mg  daily and Lorazepam 0.5mg  q4hr prn, but he still exhibits anxious and restless movements especially in pm. Will increase Sertraline to 75mg  for better mood management and Depakote 250mg  daily to stabilize mood.      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 Namenda started and tolerated.      Loss of weight Stabilized.     Family/ Staff Communication: observe the patient.   Goals of Care: SNF  Labs/tests ordered: none

## 2014-02-03 NOTE — Assessment & Plan Note (Signed)
Hgb 11.5 12/01/13 

## 2014-02-03 NOTE — Assessment & Plan Note (Signed)
Takes Levothyroxine 50mcg, TSH 2.35 07/07/13 and 1.761 12/01/13    

## 2014-02-03 NOTE — Assessment & Plan Note (Signed)
Stable with Senna II qd  along with MiraLax dailly, Colace 100mg  bid,  and prn Bisacodyl suppository prn

## 2014-02-03 NOTE — Assessment & Plan Note (Signed)
Controlled. Off antihypertensive agent.

## 2014-02-13 ENCOUNTER — Encounter: Payer: Self-pay | Admitting: *Deleted

## 2014-02-17 ENCOUNTER — Encounter: Payer: Self-pay | Admitting: Nurse Practitioner

## 2014-02-17 ENCOUNTER — Non-Acute Institutional Stay (SKILLED_NURSING_FACILITY): Payer: Medicare Other | Admitting: Nurse Practitioner

## 2014-02-17 DIAGNOSIS — R413 Other amnesia: Secondary | ICD-10-CM

## 2014-02-17 DIAGNOSIS — R269 Unspecified abnormalities of gait and mobility: Secondary | ICD-10-CM

## 2014-02-17 DIAGNOSIS — D62 Acute posthemorrhagic anemia: Secondary | ICD-10-CM

## 2014-02-17 DIAGNOSIS — K5900 Constipation, unspecified: Secondary | ICD-10-CM

## 2014-02-17 DIAGNOSIS — N400 Enlarged prostate without lower urinary tract symptoms: Secondary | ICD-10-CM

## 2014-02-17 DIAGNOSIS — W19XXXA Unspecified fall, initial encounter: Secondary | ICD-10-CM

## 2014-02-17 DIAGNOSIS — E039 Hypothyroidism, unspecified: Secondary | ICD-10-CM

## 2014-02-17 DIAGNOSIS — I69919 Unspecified symptoms and signs involving cognitive functions following unspecified cerebrovascular disease: Secondary | ICD-10-CM

## 2014-02-17 DIAGNOSIS — I69319 Unspecified symptoms and signs involving cognitive functions following cerebral infarction: Secondary | ICD-10-CM

## 2014-02-17 DIAGNOSIS — F4323 Adjustment disorder with mixed anxiety and depressed mood: Secondary | ICD-10-CM

## 2014-02-17 DIAGNOSIS — I10 Essential (primary) hypertension: Secondary | ICD-10-CM

## 2014-02-17 DIAGNOSIS — I2510 Atherosclerotic heart disease of native coronary artery without angina pectoris: Secondary | ICD-10-CM

## 2014-02-17 HISTORY — DX: Unspecified abnormalities of gait and mobility: R26.9

## 2014-02-17 NOTE — Assessment & Plan Note (Signed)
PT to evaluate and treat as indicated.

## 2014-02-17 NOTE — Assessment & Plan Note (Signed)
Takes Levothyroxine 50mcg, TSH 2.35 07/07/13 and 1.761 12/01/13    

## 2014-02-17 NOTE — Assessment & Plan Note (Signed)
Mechanical fall w/o apparent injury. Multiple factorials: increased frailty, gait abnormality, old CVA, OA-recent left total knee replacement, and lack of safety awareness related to his cognitive impairment. PT to evaluate and treat. Intensive supervision needed for safety

## 2014-02-17 NOTE — Assessment & Plan Note (Signed)
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 Namenda started and tolerated.   

## 2014-02-17 NOTE — Assessment & Plan Note (Signed)
slow mentation with confusion and body movements. CT head 12/02/13 showed chronic left external capsule infarct. Continue SNF for care.

## 2014-02-17 NOTE — Assessment & Plan Note (Signed)
Hx of anxiety and Ativan use. Better with Sertraline to 75mg  and Depakote 250mg daily   

## 2014-02-17 NOTE — Assessment & Plan Note (Signed)
Takes Tamsulosin. No urinary retention.    

## 2014-02-17 NOTE — Assessment & Plan Note (Signed)
Hgb 11.5 12/01/13

## 2014-02-17 NOTE — Assessment & Plan Note (Signed)
s/p CABG >20 years. No c/o angina or chest pressures. Takes ASA 325mg  daily

## 2014-02-17 NOTE — Assessment & Plan Note (Signed)
Controlled. Off antihypertensive agent.  

## 2014-02-17 NOTE — Progress Notes (Signed)
Patient ID: William Roach, male   DOB: October 04, 1920, 80 y.o.   MRN: [id number]   Code Status: DNR  No Known Allergies  Chief Complaint  Patient presents with  . Medical Management of Chronic Issues  . Acute Visit    worsened gait difficulty.     HPI: Patient is a 80 y.o. male seen in the SNF at [facility] today for evaluation of worsened gait abnormality, s/p fall, and chronic medical conditions.  Problem List Items Addressed This Visit   Acute blood loss anemia - Primary     Hgb 11.5 12/01/13      Adjustment disorder with mixed anxiety and depressed mood     Hx of anxiety and Ativan use. Better with Sertraline to 75mg   and Depakote 250mg  daily       BPH (benign prostatic hyperplasia)     Takes Tamsulosin. No urinary retention.      CAD (coronary artery disease)     s/p CABG >20 years. No c/o angina or chest pressures. Takes ASA 325mg  daily      CVA, old, cognitive deficits     slow mentation with confusion and body movements. CT head 12/02/13 showed chronic left external capsule infarct. Continue SNF for care.      Fall     Mechanical fall w/o apparent injury. Multiple factorials: increased frailty, gait abnormality, old CVA, OA-recent left total knee replacement, and lack of safety awareness related to his cognitive impairment. PT to evaluate and treat. Intensive supervision needed for safety    Gait difficulty     PT to evaluate and treat as indicated.     HTN (hypertension)     Controlled. Off antihypertensive agent.      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 Namenda started and tolerated.      Unspecified constipation     Stable with Senna II qd  along with MiraLax dailly, Colace 100mg  bid,  and prn Bisacodyl suppository prn      Unspecified hypothyroidism     Takes Levothyroxine 5mcg, TSH 2.35 07/07/13 and 1.761 12/01/13         Review of Systems:  Review of Systems  Constitutional: Negative for fever,  chills, weight loss and malaise/fatigue.       Generalized weakness-no longer ambulates with walker and difficulty transferring self  HENT: Negative for congestion, ear discharge, ear pain, hearing loss, nosebleeds, sore throat and tinnitus.   Eyes: Negative for blurred vision, double vision, photophobia, pain, discharge and redness.  Respiratory: Negative for cough, hemoptysis, sputum production, shortness of breath, wheezing and stridor.   Cardiovascular: Positive for leg swelling. Negative for chest pain, palpitations, orthopnea, claudication and PND.       Trace LLE  Gastrointestinal: Negative for heartburn, nausea, vomiting, abdominal pain, diarrhea, constipation, blood in stool and melena.  Genitourinary: Negative for dysuria, urgency, frequency, hematuria and flank pain.  Musculoskeletal: Positive for falls and joint pain. Negative for back pain, myalgias and neck pain.       Left knee-much better. In his room w/o apparent injury noted.   Skin: Negative for itching and rash.       The left knee surgical incision is healed. Mild fever and erythema still presents  Neurological: Positive for sensory change and weakness. Negative for dizziness, tingling, tremors, speech change, focal weakness, seizures, loss of consciousness and headaches.       Numbness in BLE/feet in am-better once he is up.  Difficulty ambulates with walker independently  Endo/Heme/Allergies: Negative for environmental allergies and polydipsia. Does not bruise/bleed easily.  Psychiatric/Behavioral: Positive for depression and memory loss. Negative for suicidal ideas, hallucinations and substance abuse. The patient is nervous/anxious. The patient does not have insomnia.        Sad facial looks-better/brighter     Past Medical History  Diagnosis Date  . Coronary artery disease     post CABG x4   . Hypertension   . Hypercholesterolemia   . Fatigue   . Dizziness   . Basal cell carcinoma, ear     post -- partial  pinnectomy left side with primary closure    . Hypothyroidism   . History of kidney stones     x1   Past Surgical History  Procedure Laterality Date  . Coronary artery bypass graft  2003    x4 --   . Cholecystectomy  2002  . Inguinal hernia repair  09/19/2007  . Ear cyst excision  12/14/2009    Excision of left auricular cyst with primary closure  . Basal cell carcinoma excision  01/14/2010    Wedge resection, partial pinnectomy left side with primary closure      . Tonsillectomy    . Carpal tunnel release    . Cataract surgery Bilateral 06-24-13  . Tonsillectomy    . Cystoscopy    . Total knee arthroplasty Left 06/27/2013    Procedure: LEFT TOTAL KNEE ARTHROPLASTY;  Surgeon: Tobi Bastos, MD;  Location: [facility];  Service: Orthopedics;  Laterality: Left;   Social History:   reports that he quit smoking about 32 years ago. He does not have any smokeless tobacco history on file. He reports that he does not drink alcohol or use illicit drugs.  Family History  Problem Relation Age of Onset  . Parkinsonism Father   . Angina Mother   . Coronary artery disease Brother     with CABG    Medications: Patient's Medications  New Prescriptions   No medications on file  Previous Medications   ASPIRIN 325 MG TABLET    Take 325 mg by mouth 2 (two) times daily.   BISACODYL (DULCOLAX) 10 MG SUPPOSITORY    Place 1 suppository (10 mg total) rectally daily as needed.   DOCUSATE SODIUM (COLACE) 100 MG CAPSULE    Take 100 mg by mouth 2 (two) times daily.   LEVOTHYROXINE (SYNTHROID, LEVOTHROID) 50 MCG TABLET    Take 50 mcg by mouth daily before breakfast.    LORAZEPAM (ATIVAN) 0.5 MG TABLET    0.5 mg every 4 (four) hours as needed.    MEMANTINE HCL ER (NAMENDA XR) 28 MG CP24    Take by mouth.   OXYCODONE (OXY IR/ROXICODONE) 5 MG IMMEDIATE RELEASE TABLET    Take 1-2 tablets (5-10 mg total) by mouth every 3 (three) hours as needed.   POLYETHYLENE GLYCOL (MIRALAX / GLYCOLAX) PACKET    Take 17 g  by mouth daily as needed.   SENNA (SENOKOT) 8.6 MG TABLET    Take 2 tablets by mouth daily.    SERTRALINE (ZOLOFT) 25 MG TABLET    Take 75 mg by mouth daily.   TAMSULOSIN (FLOMAX) 0.4 MG CAPS CAPSULE    Take 1 capsule (0.4 mg total) by mouth daily.  Modified Medications   No medications on file  Discontinued Medications   No medications on file     Physical Exam: Physical Exam  Constitutional: He is oriented to person, place, and time.  He appears well-developed and well-nourished. No distress.  generalized weakness. No longer transferring self and ambulates with walker.   HENT:  Head: Normocephalic and atraumatic.  Right Ear: External ear normal.  Left Ear: External ear normal.  Nose: Nose normal.  Mouth/Throat: Oropharynx is clear and moist. No oropharyngeal exudate.  Eyes: Conjunctivae and EOM are normal. Pupils are equal, round, and reactive to light. Right eye exhibits no discharge. Left eye exhibits no discharge. No scleral icterus.  Neck: Normal range of motion. Neck supple. No JVD present. No tracheal deviation present. No thyromegaly present.  Cardiovascular: Normal rate, regular rhythm, normal heart sounds and intact distal pulses.   No murmur heard. Pulmonary/Chest: Effort normal. No stridor. No respiratory distress. He has no wheezes. He has rales. He exhibits no tenderness.  Dry rales bases.   Abdominal: Soft. Bowel sounds are normal. He exhibits no distension. There is no tenderness. There is no rebound and no guarding.  Genitourinary: Penis normal.  Musculoskeletal: Normal range of motion. He exhibits edema and tenderness.   Left knee mild swelling and warmth with limited flexion and extension ROM.   Lymphadenopathy:    He has no cervical adenopathy.  Neurological: He is alert and oriented to person, place, and time. He has normal reflexes. No cranial nerve deficit. He exhibits normal muscle tone. Coordination abnormal.  Decreased coordination of the RUE-improving.     Skin: Skin is warm and dry. No rash noted. He is not diaphoretic. There is erythema. No pallor.  The left knee surgical incision is healed. Mild erythema Left knee.   Psychiatric: His speech is normal. Judgment and thought content normal. His mood appears anxious. He is slowed and withdrawn. Cognition and memory are impaired. He exhibits abnormal recent memory.  Flat affect-improved.     Filed Vitals:   02/17/14 1712  BP: 142/85  Pulse: 74  Temp: 97.9 F (36.6 C)  TempSrc: Tympanic  Resp: 20      Labs reviewed: Basic Metabolic Panel:  Recent Labs  06/24/13 1430 06/28/13 0541 06/29/13 0540  07/07/13 10/13/13 10/29/13 12/01/13  NA 136 136 135  < > 138 142 138 141  K 4.9 4.1 3.7  --  3.8 4.4 3.9 4.3  CL 101 103 102  --   --   --   --   --   CO2 26 26 24   --   --   --   --   --   GLUCOSE 97 115* 121*  --   --   --   --   --   BUN 31* 19 22  < > 20 26* 19 29*  CREATININE 0.98 0.83 0.81  < > 0.7 0.8 0.7 0.7  CALCIUM 9.7 8.6 8.5  --   --   --   --   --   TSH  --   --   --   --  2.35  --   --   --   < > = values in this interval not displayed. Liver Function Tests:  Recent Labs  04/09/13 1030 05/14/13 0927 06/24/13 1430 07/07/13 10/29/13 12/01/13  AST 83* 33 30 19 14  13*  ALT 137* 40 22 19 9* 9*  ALKPHOS 76 71 87 114 98 149*  BILITOT 1.0 1.1 0.6  --   --   --   PROT 6.6 6.3 6.7  --   --   --   ALBUMIN 4.1 3.9 3.9  --   --   --  CBC:  Recent Labs  06/29/13 0540 06/30/13 0425 06/30/13 1515 07/01/13 0428  10/13/13 10/29/13 12/01/13  WBC 12.9* 9.7  --  9.3  < > 8.0 11.7 10.8  NEUTROABS  --   --   --  7.0  --   --   --   --   HGB 9.6* 8.3* 8.6* 8.1*  < > 14.6 12.8* 11.5*  HCT 28.2* 24.9* 24.9* 23.5*  < > 43 38* 34*  MCV 93.1 92.6  --  92.5  --   --   --   --   PLT 113* 92*  --  117*  < > 192 199 327  < > = values in this interval not displayed. Lipid Panel:  Recent Labs  04/09/13 1030  CHOL 138  HDL 40.10  LDLCALC 69  TRIG 144.0  CHOLHDL 3    Past  Procedures:  10/28/13 CXR minimal cardiomegaly wihtout pulmonary vascular congestion, no pleural effusion, patchy bibasilar pneumonitis.   12/02/13 CT with and without contrast: atrophy and chronic left external capsule infarct. No acute abnormality.   Assessment/Plan Acute blood loss anemia Hgb 11.5 12/01/13    Adjustment disorder with mixed anxiety and depressed mood Hx of anxiety and Ativan use. Better with Sertraline to 75mg   and Depakote 250mg  daily     BPH (benign prostatic hyperplasia) Takes Tamsulosin. No urinary retention.    CAD (coronary artery disease) s/p CABG >20 years. No c/o angina or chest pressures. Takes ASA 325mg  daily    CVA, old, cognitive deficits slow mentation with confusion and body movements. CT head 12/02/13 showed chronic left external capsule infarct. Continue SNF for care.    HTN (hypertension) Controlled. Off antihypertensive agent.    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 Namenda started and tolerated.    Unspecified constipation Stable with Senna II qd  along with MiraLax dailly, Colace 100mg  bid,  and prn Bisacodyl suppository prn    Unspecified hypothyroidism Takes Levothyroxine 45mcg, TSH 2.35 07/07/13 and 1.761 12/01/13    Gait difficulty PT to evaluate and treat as indicated.   Fall Mechanical fall w/o apparent injury. Multiple factorials: increased frailty, gait abnormality, old CVA, OA-recent left total knee replacement, and lack of safety awareness related to his cognitive impairment. PT to evaluate and treat. Intensive supervision needed for safety    Family/ Staff Communication: observe the patient. PT to evaluate and treat   Goals of Care: SNF  Labs/tests ordered: none

## 2014-02-17 NOTE — Assessment & Plan Note (Signed)
Stable with Senna II qd  along with MiraLax dailly, Colace 100mg bid,  and prn Bisacodyl suppository prn   

## 2014-03-06 ENCOUNTER — Encounter: Payer: Self-pay | Admitting: Nurse Practitioner

## 2014-03-06 ENCOUNTER — Non-Acute Institutional Stay (SKILLED_NURSING_FACILITY): Payer: Medicare Other | Admitting: Nurse Practitioner

## 2014-03-06 DIAGNOSIS — W19XXXA Unspecified fall, initial encounter: Secondary | ICD-10-CM

## 2014-03-06 DIAGNOSIS — R413 Other amnesia: Secondary | ICD-10-CM

## 2014-03-06 DIAGNOSIS — N400 Enlarged prostate without lower urinary tract symptoms: Secondary | ICD-10-CM

## 2014-03-06 DIAGNOSIS — F4321 Adjustment disorder with depressed mood: Secondary | ICD-10-CM

## 2014-03-06 DIAGNOSIS — E039 Hypothyroidism, unspecified: Secondary | ICD-10-CM

## 2014-03-06 DIAGNOSIS — K5900 Constipation, unspecified: Secondary | ICD-10-CM

## 2014-03-06 NOTE — Assessment & Plan Note (Signed)
Stable with Senna II qd, MiraLax dailly, Colace 100mg bid,  and prn Bisacodyl suppository prn  

## 2014-03-06 NOTE — Assessment & Plan Note (Signed)
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 Namenda started and tolerated.   

## 2014-03-06 NOTE — Assessment & Plan Note (Signed)
[place] [address] 03/06/14 when transferred himself from w/c to bed. No apparent injury noted. Continue PT and intensive supervision

## 2014-03-06 NOTE — Progress Notes (Signed)
Patient ID: William Roach, male   DOB: 1921-07-19, 80 y.o.   MRN: [id number]   Code Status: DNR  No Known Allergies  Chief Complaint  Patient presents with  . Medical Management of Chronic Issues  . Acute Visit    fall    HPI: Patient is a 80 y.o. male seen in the SNF at [facility] today for evaluation of s/p fall and chronic medical conditions.  Problem List Items Addressed This Visit   Unspecified hypothyroidism - Primary     Takes Levothyroxine 90mcg, TSH 2.35 07/07/13 and 1.761 12/01/13      Unspecified constipation     Stable with Senna II qd, MiraLax dailly, Colace 100mg  bid,  and prn Bisacodyl suppository prn      Situational depression     Hx of anxiety and Ativan use. Better with Sertraline to 75mg  and Depakote 250mg  daily. 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 Namenda started and tolerated.       [place] Bowl 03/06/14 when transferred himself from w/c to bed. No apparent injury noted. Continue PT and intensive supervision      BPH (benign prostatic hyperplasia)     Takes Tamsulosin. No urinary retention.          Review of Systems:  Review of Systems  Constitutional: Negative for fever, chills, weight loss and malaise/fatigue.       Generalized weakness-no longer ambulates with walker and difficulty transferring self  HENT: Negative for congestion, ear discharge, ear pain, hearing loss, nosebleeds, sore throat and tinnitus.   Eyes: Negative for blurred vision, double vision, photophobia, pain, discharge and redness.  Respiratory: Negative for cough, hemoptysis, sputum production, shortness of breath, wheezing and stridor.   Cardiovascular: Positive for leg swelling. Negative for chest pain, palpitations, orthopnea, claudication and PND.       Trace LLE  Gastrointestinal: Negative for heartburn, nausea, vomiting, abdominal pain, diarrhea,  constipation, blood in stool and melena.  Genitourinary: Negative for dysuria, urgency, frequency, hematuria and flank pain.  Musculoskeletal: Positive for falls and joint pain. Negative for back pain, myalgias and neck pain.       Left knee-much better. [place] 03/06/14 In his room when transferred himself from w/c to bed w/o apparent injury noted.   Skin: Negative for itching and rash.       The left knee surgical incision is healed. Mild fever and erythema still presents  Neurological: Positive for sensory change and weakness. Negative for dizziness, tingling, tremors, speech change, focal weakness, seizures, loss of consciousness and headaches.       Numbness in BLE/feet in am-better once he is up. Difficulty ambulates with walker independently  Endo/Heme/Allergies: Negative for environmental allergies and polydipsia. Does not bruise/bleed easily.  Psychiatric/Behavioral: Positive for depression and memory loss. Negative for suicidal ideas, hallucinations and substance abuse. The patient is nervous/anxious. The patient does not have insomnia.        Sad facial looks-better/brighter     Past Medical History  Diagnosis Date  . Coronary artery disease     post CABG x4   . Hypertension   . Hypercholesterolemia   . Fatigue   . Dizziness   . Basal cell carcinoma, ear     post -- partial pinnectomy left side with primary closure    . Hypothyroidism   . History of  kidney stones     x1   Past Surgical History  Procedure Laterality Date  . Coronary artery bypass graft  2003    x4 --   . Cholecystectomy  2002  . Inguinal hernia repair  09/19/2007  . Ear cyst excision  12/14/2009    Excision of left auricular cyst with primary closure  . Basal cell carcinoma excision  01/14/2010    Wedge resection, partial pinnectomy left side with primary closure      . Tonsillectomy    . Carpal tunnel release    . Cataract surgery Bilateral 06-24-13  . Tonsillectomy    . Cystoscopy    . Total knee  arthroplasty Left 06/27/2013    Procedure: LEFT TOTAL KNEE ARTHROPLASTY;  Surgeon: Tobi Bastos, MD;  Location: [facility];  Service: Orthopedics;  Laterality: Left;   Social History:   reports that he quit smoking about 32 years ago. He does not have any smokeless tobacco history on file. He reports that he does not drink alcohol or use illicit drugs.  Family History  Problem Relation Age of Onset  . Parkinsonism Father   . Angina Mother   . Coronary artery disease Brother     with CABG    Medications: Patient's Medications  New Prescriptions   No medications on file  Previous Medications   ASPIRIN 325 MG TABLET    Take 325 mg by mouth 2 (two) times daily.   BISACODYL (DULCOLAX) 10 MG SUPPOSITORY    Place 1 suppository (10 mg total) rectally daily as needed.   DOCUSATE SODIUM (COLACE) 100 MG CAPSULE    Take 100 mg by mouth 2 (two) times daily.   LEVOTHYROXINE (SYNTHROID, LEVOTHROID) 50 MCG TABLET    Take 50 mcg by mouth daily before breakfast.    LORAZEPAM (ATIVAN) 0.5 MG TABLET    0.5 mg every 4 (four) hours as needed.    MEMANTINE HCL ER (NAMENDA XR) 28 MG CP24    Take by mouth.   OXYCODONE (OXY IR/ROXICODONE) 5 MG IMMEDIATE RELEASE TABLET    Take 1-2 tablets (5-10 mg total) by mouth every 3 (three) hours as needed.   POLYETHYLENE GLYCOL (MIRALAX / GLYCOLAX) PACKET    Take 17 g by mouth daily as needed.   SENNA (SENOKOT) 8.6 MG TABLET    Take 2 tablets by mouth daily.    SERTRALINE (ZOLOFT) 25 MG TABLET    Take 75 mg by mouth daily.   TAMSULOSIN (FLOMAX) 0.4 MG CAPS CAPSULE    Take 1 capsule (0.4 mg total) by mouth daily.  Modified Medications   No medications on file  Discontinued Medications   No medications on file     Physical Exam: Physical Exam  Constitutional: He is oriented to person, place, and time. He appears well-developed and well-nourished. No distress.  generalized weakness. No longer transferring self and ambulates with walker.   HENT:  Head: Normocephalic  and atraumatic.  Right Ear: External ear normal.  Left Ear: External ear normal.  Nose: Nose normal.  Mouth/Throat: Oropharynx is clear and moist. No oropharyngeal exudate.  Eyes: Conjunctivae and EOM are normal. Pupils are equal, round, and reactive to light. Right eye exhibits no discharge. Left eye exhibits no discharge. No scleral icterus.  Neck: Normal range of motion. Neck supple. No JVD present. No tracheal deviation present. No thyromegaly present.  Cardiovascular: Normal rate, regular rhythm, normal heart sounds and intact distal pulses.   No murmur heard. Pulmonary/Chest: Effort normal. No stridor.  No respiratory distress. He has no wheezes. He has rales. He exhibits no tenderness.  Dry rales bases.   Abdominal: Soft. Bowel sounds are normal. He exhibits no distension. There is no tenderness. There is no rebound and no guarding.  Genitourinary: Penis normal.  Musculoskeletal: Normal range of motion. He exhibits edema and tenderness.   Left knee mild swelling and warmth with limited flexion and extension ROM.   Lymphadenopathy:    He has no cervical adenopathy.  Neurological: He is alert and oriented to person, place, and time. He has normal reflexes. No cranial nerve deficit. He exhibits normal muscle tone. Coordination abnormal.  Decreased coordination of the RUE-improving.   Skin: Skin is warm and dry. No rash noted. He is not diaphoretic. There is erythema. No pallor.  The left knee surgical incision is healed. Mild erythema Left knee.   Psychiatric: His speech is normal. Judgment and thought content normal. His mood appears anxious. He is slowed and withdrawn. Cognition and memory are impaired. He exhibits abnormal recent memory.  Flat affect-improved.     Filed Vitals:   03/06/14 1207  BP: 136/70  Pulse: 68  Temp: 97.3 F (36.3 C)  TempSrc: Tympanic  Resp: 16      Labs reviewed: Basic Metabolic Panel:  Recent Labs  06/24/13 1430 06/28/13 0541 06/29/13 0540   07/07/13 10/13/13 10/29/13 12/01/13  NA 136 136 135  < > 138 142 138 141  K 4.9 4.1 3.7  --  3.8 4.4 3.9 4.3  CL 101 103 102  --   --   --   --   --   CO2 26 26 24   --   --   --   --   --   GLUCOSE 97 115* 121*  --   --   --   --   --   BUN 31* 19 22  < > 20 26* 19 29*  CREATININE 0.98 0.83 0.81  < > 0.7 0.8 0.7 0.7  CALCIUM 9.7 8.6 8.5  --   --   --   --   --   TSH  --   --   --   --  2.35  --   --   --   < > = values in this interval not displayed. Liver Function Tests:  Recent Labs  04/09/13 1030 05/14/13 0927 06/24/13 1430 07/07/13 10/29/13 12/01/13  AST 83* 33 30 19 14  13*  ALT 137* 40 22 19 9* 9*  ALKPHOS 76 71 87 114 98 149*  BILITOT 1.0 1.1 0.6  --   --   --   PROT 6.6 6.3 6.7  --   --   --   ALBUMIN 4.1 3.9 3.9  --   --   --    CBC:  Recent Labs  06/29/13 0540 06/30/13 0425 06/30/13 1515 07/01/13 0428  10/13/13 10/29/13 12/01/13  WBC 12.9* 9.7  --  9.3  < > 8.0 11.7 10.8  NEUTROABS  --   --   --  7.0  --   --   --   --   HGB 9.6* 8.3* 8.6* 8.1*  < > 14.6 12.8* 11.5*  HCT 28.2* 24.9* 24.9* 23.5*  < > 43 38* 34*  MCV 93.1 92.6  --  92.5  --   --   --   --   PLT 113* 92*  --  117*  < > 192 199 327  < > = values in this  interval not displayed. Lipid Panel:  Recent Labs  04/09/13 1030  CHOL 138  HDL 40.10  LDLCALC 69  TRIG 144.0  CHOLHDL 3    Past Procedures:  10/28/13 CXR minimal cardiomegaly wihtout pulmonary vascular congestion, no pleural effusion, patchy bibasilar pneumonitis.   12/02/13 CT with and without contrast: atrophy and chronic left external capsule infarct. No acute abnormality.   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 Namenda started and tolerated.     Situational depression Hx of anxiety and Ativan use. Better with Sertraline to 75mg  and Depakote 250mg  daily. Also Lorazepam 0.5mg  q4h prn  is available to him      Unspecified constipation Stable with Senna II qd, MiraLax dailly, Colace 100mg  bid,  and prn Bisacodyl suppository prn    BPH (benign prostatic hyperplasia) Takes Tamsulosin. No urinary retention.     Low[place] Bowl 03/06/14 when transferred himself from w/c to bed. No apparent injury noted. Continue PT and intensive supervision      Family/ Staff Communication: observe the patient. PT to evaluate and treat   Goals of Care: SNF  Labs/tests ordered: none

## 2014-03-06 NOTE — Assessment & Plan Note (Signed)
Hx of anxiety and Ativan use. Better with Sertraline to 75mg  and Depakote 250mg  daily. Also Lorazepam 0.5mg  q4h prn is available to him

## 2014-03-06 NOTE — Assessment & Plan Note (Signed)
Takes Tamsulosin. No urinary retention.    

## 2014-03-06 NOTE — Assessment & Plan Note (Signed)
Takes Levothyroxine 72mcg, TSH 2.35 07/07/13 and 1.761 12/01/13

## 2014-03-17 ENCOUNTER — Encounter: Payer: Self-pay | Admitting: Internal Medicine

## 2014-03-17 ENCOUNTER — Non-Acute Institutional Stay (SKILLED_NURSING_FACILITY): Payer: Medicare Other | Admitting: Internal Medicine

## 2014-03-17 DIAGNOSIS — M1712 Unilateral primary osteoarthritis, left knee: Secondary | ICD-10-CM

## 2014-03-17 DIAGNOSIS — I10 Essential (primary) hypertension: Secondary | ICD-10-CM

## 2014-03-17 DIAGNOSIS — IMO0002 Reserved for concepts with insufficient information to code with codable children: Secondary | ICD-10-CM

## 2014-03-17 DIAGNOSIS — M1710 Unilateral primary osteoarthritis, unspecified knee: Secondary | ICD-10-CM

## 2014-03-17 DIAGNOSIS — R413 Other amnesia: Secondary | ICD-10-CM

## 2014-03-17 NOTE — Progress Notes (Signed)
Patient ID: William Roach, male   DOB: Apr 18, 1921, 81 y.o.   MRN: [id number]    Location:  [facility] , room N15  Place of Service: SNF (31)  Extended Emergency Contact Information Primary Emergency Contact: Cheek,Frances Address: [address], [place] [address] [place] Phone: [phone] Relation: Friend Secondary Emergency Contact: Wisler,Scott Address: [place] Phone: [phone] Relation: Son   Chief Complaint  Patient presents with  . Hospitalization Follow-up    new admission to skilled nursing facility following hospitalization for left TKR    HPI:   patient was admitted 07/01/13 to [facility] skilled nursing facility following hospitalization for left total knee replacement by Dr. Lindwood Qua. He had postoperative anemia down to 8.3 g percent. His recovery has been otherwise unremarkable.  He is now admitted for further rehabilitation prior to return to his independent living apartment.  Past Medical History  Diagnosis Date  . Hypercholesterolemia   . Fatigue   . Basal cell carcinoma, ear     post -- partial pinnectomy left side with primary closure    . History of kidney stones     x1  . Acute blood loss anemia 06/30/2013  . Adjustment disorder with mixed anxiety and depressed mood 07/04/2013  . CAD (coronary artery disease) 07/04/2011    S/p CABG   . CVA, old, cognitive deficits 12/05/2013  . Dizziness and giddiness 10/07/2013  . Gait difficulty 02/17/2014  . HTN (hypertension) 07/04/2011  . Hyperlipidemia 07/04/2011  . Lack of coordination 11/08/2013  . Memory deficit 07/29/2013  . Numbness in both legs 10/31/2013  . Osteoarthritis of left knee 06/27/2013  . PAD (peripheral artery disease) 07/29/2013  . S/P knee replacement 07/01/2013  . Situational depression 10/17/2013  . Unspecified constipation 07/01/2013  . Unspecified hypothyroidism 07/01/2013     TSH 1.761  12/01/13     . Weakness 12/01/2013    Past Surgical History  Procedure Laterality Date  . Coronary artery bypass graft  2003    x4 --   . Cholecystectomy  2002  . Inguinal hernia repair  09/19/2007  . Ear cyst excision  12/14/2009    Excision of left auricular cyst with primary closure  . Basal cell carcinoma excision  01/14/2010    Wedge resection, partial pinnectomy left side with primary closure      . Tonsillectomy    . Carpal tunnel release    . Cataract surgery Bilateral 06-24-13  . Tonsillectomy    . Cystoscopy    . Total knee arthroplasty Left 06/27/2013    Procedure: LEFT TOTAL KNEE ARTHROPLASTY;  Surgeon: Tobi Bastos, MD;  Location: [facility];  Service: Orthopedics;  Laterality: Left;    History   Social History  . Marital Status: Widowed    Spouse Name: N/A    Number of Children: N/A  . Years of Education: N/A   Occupational History  . Not on file.   Social History Main Topics  . Smoking status: Former Smoker    Quit date: 06/21/1981  . Smokeless tobacco: Not on file  . Alcohol Use: No  . Drug Use: No  . Sexual Activity: No   Other Topics Concern  . Not on file   Social History Narrative  . No narrative on file     reports that he quit smoking about 32 years ago. He  does not have any smokeless tobacco history on file. He reports that he does not drink alcohol or use illicit drugs.  Immunization History  Administered Date(s) Administered  . Influenza Whole 08/01/2013    No Known Allergies  Medications: Patient's Medications  New Prescriptions   No medications on file  Previous Medications   ASPIRIN 325 MG TABLET    Take 325 mg by mouth 2 (two) times daily.   BISACODYL (DULCOLAX) 10 MG SUPPOSITORY    Place 1 suppository (10 mg total) rectally daily as needed.   DOCUSATE SODIUM (COLACE) 100 MG CAPSULE    Take 100 mg by mouth 2 (two) times daily.   LEVOTHYROXINE (SYNTHROID, LEVOTHROID) 50 MCG TABLET    Take 50 mcg by mouth daily before breakfast.      LORAZEPAM (ATIVAN) 0.5 MG TABLET    0.5 mg every 4 (four) hours as needed.    MEMANTINE HCL ER (NAMENDA XR) 28 MG CP24    Take by mouth.   OXYCODONE (OXY IR/ROXICODONE) 5 MG IMMEDIATE RELEASE TABLET    Take 1-2 tablets (5-10 mg total) by mouth every 3 (three) hours as needed.   POLYETHYLENE GLYCOL (MIRALAX / GLYCOLAX) PACKET    Take 17 g by mouth daily as needed.   SENNA (SENOKOT) 8.6 MG TABLET    Take 2 tablets by mouth daily.    SERTRALINE (ZOLOFT) 25 MG TABLET    Take 75 mg by mouth daily.   TAMSULOSIN (FLOMAX) 0.4 MG CAPS CAPSULE    Take 1 capsule (0.4 mg total) by mouth daily.  Modified Medications   No medications on file  Discontinued Medications   No medications on file     Review of Systems  Constitutional: Positive for activity change and fatigue. Negative for fever, chills, diaphoresis and unexpected weight change.  HENT: Positive for hearing loss (using aids). Negative for congestion, trouble swallowing and voice change.   Eyes: Negative.   Respiratory: Negative.  Negative for cough, choking and shortness of breath.   Cardiovascular: Negative for chest pain, palpitations and leg swelling.  Gastrointestinal: Positive for constipation. Negative for nausea, abdominal pain, diarrhea, blood in stool and abdominal distention.  Endocrine: Negative.   Genitourinary:       History BPH and urolithiasis.  Musculoskeletal:       Recent left knee surgery  Allergic/Immunologic: Negative.   Neurological:       Chronic memory loss to a moderate degree. Last MMSE 22/30. Failed clock drawing.  Hematological:       Acute blood loss anemia following surgery with hemoglobin 8.3.  Psychiatric/Behavioral: Positive for decreased concentration. Negative for behavioral problems, sleep disturbance and agitation. The patient is nervous/anxious.     There were no vitals filed for this visit. There is no weight on file to calculate BMI.  Physical Exam  Constitutional: He appears well-developed  and well-nourished. No distress.  HENT:  Mouth/Throat: No oropharyngeal exudate.  Bilateral hearing loss and use of hearing aids.  Neck: No JVD present. No tracheal deviation present. No thyromegaly present.  Cardiovascular: Normal rate, regular rhythm, normal heart sounds and intact distal pulses.  Exam reveals no gallop and no friction rub.   No murmur heard. Pulmonary/Chest: No respiratory distress. He has no wheezes. He has no rales. He exhibits no tenderness.  Abdominal: He exhibits no distension and no mass. There is no tenderness.  Musculoskeletal: He exhibits tenderness (Left knee). He exhibits no edema.  Gait impaired related to the surgery for osteoarthritis of the  left knee. Left leg is swollen and has serosanguineous drainage from the incision.  Lymphadenopathy:    He has no cervical adenopathy.  Neurological: He is alert. No cranial nerve deficit. Coordination normal.  Skin: No rash noted. No erythema. No pallor.  Psychiatric: He has a normal mood and affect. His behavior is normal. Judgment and thought content normal.     Labs reviewed: Admission on 06/27/2013, Discharged on 07/01/2013  Component Date Value Ref Range Status  . ABO/RH(D) 06/27/2013 O POS   Final  . Antibody Screen 06/27/2013 NEG   Final  . Sample Expiration 06/27/2013 06/30/2013   Final  . WBC 06/28/2013 15.2* 4.0 - 10.5 K/uL Final  . RBC 06/28/2013 3.41* 4.22 - 5.81 MIL/uL Final  . Hemoglobin 06/28/2013 11.0* 13.0 - 17.0 g/dL Final  . HCT 06/28/2013 32.0* 39.0 - 52.0 % Final  . MCV 06/28/2013 93.8  78.0 - 100.0 fL Final  . MCH 06/28/2013 32.3  26.0 - 34.0 pg Final  . MCHC 06/28/2013 34.4  30.0 - 36.0 g/dL Final  . RDW 06/28/2013 13.3  11.5 - 15.5 % Final  . Platelets 06/28/2013 138* 150 - 400 K/uL Final  . Sodium 06/28/2013 136  135 - 145 mEq/L Final  . Potassium 06/28/2013 4.1  3.5 - 5.1 mEq/L Final  . Chloride 06/28/2013 103  96 - 112 mEq/L Final  . CO2 06/28/2013 26  19 - 32 mEq/L Final  .  Glucose, Bld 06/28/2013 115* 70 - 99 mg/dL Final  . BUN 06/28/2013 19  6 - 23 mg/dL Final  . Creatinine, Ser 06/28/2013 0.83  0.50 - 1.35 mg/dL Final  . Calcium 06/28/2013 8.6  8.4 - 10.5 mg/dL Final  . GFR calc non Af Amer 06/28/2013 75* >90 mL/min Final  . GFR calc Af Amer 06/28/2013 87* >90 mL/min Final   Comment: (NOTE)                          The eGFR has been calculated using the CKD EPI equation.                          This calculation has not been validated in all clinical situations.                          eGFR's persistently <90 mL/min signify possible Chronic Kidney                          Disease.  . WBC 06/29/2013 12.9* 4.0 - 10.5 K/uL Final  . RBC 06/29/2013 3.03* 4.22 - 5.81 MIL/uL Final  . Hemoglobin 06/29/2013 9.6* 13.0 - 17.0 g/dL Final  . HCT 06/29/2013 28.2* 39.0 - 52.0 % Final  . MCV 06/29/2013 93.1  78.0 - 100.0 fL Final  . MCH 06/29/2013 31.7  26.0 - 34.0 pg Final  . MCHC 06/29/2013 34.0  30.0 - 36.0 g/dL Final  . RDW 06/29/2013 13.4  11.5 - 15.5 % Final  . Platelets 06/29/2013 113* 150 - 400 K/uL Final   Comment: REPEATED TO VERIFY                          SPECIMEN CHECKED FOR CLOTS  PLATELET COUNT CONFIRMED BY SMEAR  . Sodium 06/29/2013 135  135 - 145 mEq/L Final  . Potassium 06/29/2013 3.7  3.5 - 5.1 mEq/L Final  . Chloride 06/29/2013 102  96 - 112 mEq/L Final  . CO2 06/29/2013 24  19 - 32 mEq/L Final  . Glucose, Bld 06/29/2013 121* 70 - 99 mg/dL Final  . BUN 06/29/2013 22  6 - 23 mg/dL Final  . Creatinine, Ser 06/29/2013 0.81  0.50 - 1.35 mg/dL Final  . Calcium 06/29/2013 8.5  8.4 - 10.5 mg/dL Final  . GFR calc non Af Amer 06/29/2013 76* >90 mL/min Final  . GFR calc Af Amer 06/29/2013 88* >90 mL/min Final   Comment: (NOTE)                          The eGFR has been calculated using the CKD EPI equation.                          This calculation has not been validated in all clinical situations.                           eGFR's persistently <90 mL/min signify possible Chronic Kidney                          Disease.  . WBC 06/30/2013 9.7  4.0 - 10.5 K/uL Final  . RBC 06/30/2013 2.69* 4.22 - 5.81 MIL/uL Final  . Hemoglobin 06/30/2013 8.3* 13.0 - 17.0 g/dL Final  . HCT 06/30/2013 24.9* 39.0 - 52.0 % Final  . MCV 06/30/2013 92.6  78.0 - 100.0 fL Final  . MCH 06/30/2013 30.9  26.0 - 34.0 pg Final  . MCHC 06/30/2013 33.3  30.0 - 36.0 g/dL Final  . RDW 06/30/2013 13.7  11.5 - 15.5 % Final  . Platelets 06/30/2013 92* 150 - 400 K/uL Final   CONSISTENT WITH PREVIOUS RESULT  . Hemoglobin 06/30/2013 8.6* 13.0 - 17.0 g/dL Final  . HCT 06/30/2013 24.9* 39.0 - 52.0 % Final  . WBC 07/01/2013 9.3  4.0 - 10.5 K/uL Final  . RBC 07/01/2013 2.54* 4.22 - 5.81 MIL/uL Final  . Hemoglobin 07/01/2013 8.1* 13.0 - 17.0 g/dL Final  . HCT 07/01/2013 23.5* 39.0 - 52.0 % Final  . MCV 07/01/2013 92.5  78.0 - 100.0 fL Final  . MCH 07/01/2013 31.9  26.0 - 34.0 pg Final  . MCHC 07/01/2013 34.5  30.0 - 36.0 g/dL Final  . RDW 07/01/2013 13.5  11.5 - 15.5 % Final  . Platelets 07/01/2013 117* 150 - 400 K/uL Final   CONSISTENT WITH PREVIOUS RESULT  . Neutrophils Relative % 07/01/2013 75  43 - 77 % Final  . Neutro Abs 07/01/2013 7.0  1.7 - 7.7 K/uL Final  . Lymphocytes Relative 07/01/2013 8* 12 - 46 % Final  . Lymphs Abs 07/01/2013 0.7  0.7 - 4.0 K/uL Final  . Monocytes Relative 07/01/2013 13* 3 - 12 % Final  . Monocytes Absolute 07/01/2013 1.2* 0.1 - 1.0 K/uL Final  . Eosinophils Relative 07/01/2013 4  0 - 5 % Final  . Eosinophils Absolute 07/01/2013 0.3  0.0 - 0.7 K/uL Final  . Basophils Relative 07/01/2013 0  0 - 1 % Final  . Basophils Absolute 07/01/2013 0.0  0.0 - 0.1 K/uL Final  Hospital Outpatient Visit on 06/24/2013  Component Date  Value Ref Range Status  . ABO/RH(D) 06/24/2013 O POS   Final  Hospital Outpatient Visit on 06/24/2013  Component Date Value Ref Range Status  . MRSA, PCR 06/24/2013 NEGATIVE  NEGATIVE Final  .  Staphylococcus aureus 06/24/2013 NEGATIVE  NEGATIVE Final   Comment:                                 The Xpert SA Assay (FDA                          approved for NASAL specimens                          in patients over 80 years of age),                          is one component of                          a comprehensive surveillance                          program.  Test performance has                          been validated by [employer] for patients greater                          than or equal to 1 year old.                          It is not intended                          to diagnose infection nor to                          guide or monitor treatment.  . WBC 06/24/2013 7.4  4.0 - 10.5 K/uL Final  . RBC 06/24/2013 4.50  4.22 - 5.81 MIL/uL Final  . Hemoglobin 06/24/2013 14.3  13.0 - 17.0 g/dL Final  . HCT 06/24/2013 42.2  39.0 - 52.0 % Final  . MCV 06/24/2013 93.8  78.0 - 100.0 fL Final  . MCH 06/24/2013 31.8  26.0 - 34.0 pg Final  . MCHC 06/24/2013 33.9  30.0 - 36.0 g/dL Final  . RDW 06/24/2013 13.4  11.5 - 15.5 % Final  . Platelets 06/24/2013 177  150 - 400 K/uL Final  . aPTT 06/24/2013 27  24 - 37 seconds Final  . Sodium 06/24/2013 136  135 - 145 mEq/L Final  . Potassium 06/24/2013 4.9  3.5 - 5.1 mEq/L Final  . Chloride 06/24/2013 101  96 - 112 mEq/L Final  . CO2 06/24/2013 26  19 - 32 mEq/L Final  . Glucose, Bld 06/24/2013 97  70 - 99 mg/dL Final  . BUN 06/24/2013 31* 6 - 23 mg/dL Final  . Creatinine, Ser 06/24/2013 0.98  0.50 - 1.35 mg/dL Final  .  Calcium 06/24/2013 9.7  8.4 - 10.5 mg/dL Final  . Total Protein 06/24/2013 6.7  6.0 - 8.3 g/dL Final  . Albumin 06/24/2013 3.9  3.5 - 5.2 g/dL Final  . AST 06/24/2013 30  0 - 37 U/L Final  . ALT 06/24/2013 22  0 - 53 U/L Final  . Alkaline Phosphatase 06/24/2013 87  39 - 117 U/L Final  . Total Bilirubin 06/24/2013 0.6  0.3 - 1.2 mg/dL Final  . GFR calc non Af Amer 06/24/2013 70* >90 mL/min  Final  . GFR calc Af Amer 06/24/2013 81* >90 mL/min Final   Comment: (NOTE)                          The eGFR has been calculated using the CKD EPI equation.                          This calculation has not been validated in all clinical situations.                          eGFR's persistently <90 mL/min signify possible Chronic Kidney                          Disease.  Marland Kitchen Prothrombin Time 06/24/2013 12.6  11.6 - 15.2 seconds Final  . INR 06/24/2013 0.96  0.00 - 1.49 Final  . ABO/RH(D) 06/24/2013 O POS   Final  . Antibody Screen 06/24/2013 NEG   Final  . Sample Expiration 06/24/2013 06/30/2013   Final  . Color, Urine 06/24/2013 YELLOW  YELLOW Final  . APPearance 06/24/2013 CLOUDY* CLEAR Final  . Specific Gravity, Urine 06/24/2013 1.020  1.005 - 1.030 Final  . pH 06/24/2013 6.5  5.0 - 8.0 Final  . Glucose, UA 06/24/2013 NEGATIVE  NEGATIVE mg/dL Final  . Hgb urine dipstick 06/24/2013 NEGATIVE  NEGATIVE Final  . Bilirubin Urine 06/24/2013 NEGATIVE  NEGATIVE Final  . Ketones, ur 06/24/2013 NEGATIVE  NEGATIVE mg/dL Final  . Protein, ur 06/24/2013 NEGATIVE  NEGATIVE mg/dL Final  . Urobilinogen, UA 06/24/2013 0.2  0.0 - 1.0 mg/dL Final  . Nitrite 06/24/2013 NEGATIVE  NEGATIVE Final  . Leukocytes, UA 06/24/2013 NEGATIVE  NEGATIVE Final   MICROSCOPIC NOT DONE ON URINES WITH NEGATIVE PROTEIN, BLOOD, LEUKOCYTES, NITRITE, OR GLUCOSE <1000 mg/dL.  Appointment on 05/14/2013  Component Date Value Ref Range Status  . Total Bilirubin 05/14/2013 1.1  0.3 - 1.2 mg/dL Final  . Bilirubin, Direct 05/14/2013 0.2  0.0 - 0.3 mg/dL Final  . Alkaline Phosphatase 05/14/2013 71  39 - 117 U/L Final  . AST 05/14/2013 33  0 - 37 U/L Final  . ALT 05/14/2013 40  0 - 53 U/L Final  . Total Protein 05/14/2013 6.3  6.0 - 8.3 g/dL Final  . Albumin 05/14/2013 3.9  3.5 - 5.2 g/dL Final  Office Visit on 04/09/2013  Component Date Value Ref Range Status  . Sodium 04/09/2013 139  135 - 145 mEq/L Final  . Potassium  04/09/2013 4.6  3.5 - 5.1 mEq/L Final  . Chloride 04/09/2013 106  96 - 112 mEq/L Final  . CO2 04/09/2013 29  19 - 32 mEq/L Final  . Glucose, Bld 04/09/2013 123* 70 - 99 mg/dL Final  . BUN 04/09/2013 25* 6 - 23 mg/dL Final  . Creatinine, Ser 04/09/2013 1.0  0.4 - 1.5 mg/dL Final  .  Calcium 04/09/2013 9.4  8.4 - 10.5 mg/dL Final  . GFR 04/09/2013 77.28  >60.00 mL/min Final  . Cholesterol 04/09/2013 138  0 - 200 mg/dL Final   ATP III Classification       Desirable:  < 200 mg/dL               Borderline High:  200 - 239 mg/dL          High:  > = 240 mg/dL  . Triglycerides 04/09/2013 144.0  0.0 - 149.0 mg/dL Final   Normal:  <150 mg/dLBorderline High:  150 - 199 mg/dL  . HDL 04/09/2013 40.10  >39.00 mg/dL Final  . VLDL 04/09/2013 28.8  0.0 - 40.0 mg/dL Final  . LDL Cholesterol 04/09/2013 69  0 - 99 mg/dL Final  . Total CHOL/HDL Ratio 04/09/2013 3   Final                  Men          Women1/2 Average Risk     3.4          3.3Average Risk          5.0          4.42X Average Risk          9.6          7.13X Average Risk          15.0          11.0                      . Total Bilirubin 04/09/2013 1.0  0.3 - 1.2 mg/dL Final  . Bilirubin, Direct 04/09/2013 0.1  0.0 - 0.3 mg/dL Final  . Alkaline Phosphatase 04/09/2013 76  39 - 117 U/L Final  . AST 04/09/2013 83* 0 - 37 U/L Final  . ALT 04/09/2013 137* 0 - 53 U/L Final  . Total Protein 04/09/2013 6.6  6.0 - 8.3 g/dL Final  . Albumin 04/09/2013 4.1  3.5 - 5.2 g/dL Final      Assessment/Plan  1. S/P knee replacement Patient is engaged in physical therapy and occupational therapy. Daily wound care.  2. Weakness PT and OT  3. Acute blood loss anemia Routine lab followup  4. CAD (coronary artery disease) No recent changes. No angina. Denies dyspnea.  5. HTN (hypertension) Controlled  6. Hyperlipidemia Follow lab  7. Constipation Use laxatives as needed  8. BPH (benign prostatic hyperplasia) Monitor for problems of urination  9.  Gait difficulty PT and OT   Lab ordered for next week included CBC, CMP, and TSH.

## 2014-03-17 NOTE — Progress Notes (Signed)
Patient ID: William Roach, male   DOB: July 25, 1921, 80 y.o.   MRN: [id number]    Location:  [facility] , room N15  Place of Service: SNF (31)    No Known Allergies  Chief Complaint  Patient presents with  . Acute Visit    Left knee pain and inability to walk    HPI:  Patient was seen acutely for continued problems of inability to walk. Is not having a lot of pain in the left knee, but does complain of some pain with weightbearing. There is a flexion contracture which was evident at the time he was admitted in October 2014. The left knee has had total knee replacement by Dr. Gladstone Lighter. Patient has seen Dr. Gladstone Lighter in followup. He remains unable to walk because of buckling of the left knee.  Medications: Patient's Medications  New Prescriptions   No medications on file  Previous Medications   ASPIRIN 325 MG TABLET    Take 325 mg by mouth 2 (two) times daily.   BISACODYL (DULCOLAX) 10 MG SUPPOSITORY    Place 1 suppository (10 mg total) rectally daily as needed.   DOCUSATE SODIUM (COLACE) 100 MG CAPSULE    Take 100 mg by mouth 2 (two) times daily.   LEVOTHYROXINE (SYNTHROID, LEVOTHROID) 50 MCG TABLET    Take 50 mcg by mouth daily before breakfast.    LORAZEPAM (ATIVAN) 0.5 MG TABLET    0.5 mg every 4 (four) hours as needed.    MEMANTINE HCL ER (NAMENDA XR) 28 MG CP24    Take by mouth.   OXYCODONE (OXY IR/ROXICODONE) 5 MG IMMEDIATE RELEASE TABLET    Take 1-2 tablets (5-10 mg total) by mouth every 3 (three) hours as needed.   POLYETHYLENE GLYCOL (MIRALAX / GLYCOLAX) PACKET    Take 17 g by mouth daily as needed.   SENNA (SENOKOT) 8.6 MG TABLET    Take 2 tablets by mouth daily.    SERTRALINE (ZOLOFT) 25 MG TABLET    Take 75 mg by mouth daily.   TAMSULOSIN (FLOMAX) 0.4 MG CAPS CAPSULE    Take 1 capsule (0.4 mg total) by mouth daily.  Modified Medications   No medications on file  Discontinued Medications   No medications on file     Review of Systems  Constitutional: Positive  for activity change and fatigue. Negative for fever, chills, diaphoresis and unexpected weight change.  HENT: Positive for hearing loss (using aids). Negative for congestion, trouble swallowing and voice change.   Eyes: Negative.   Respiratory: Negative.  Negative for cough, choking and shortness of breath.   Cardiovascular: Negative for chest pain, palpitations and leg swelling.  Gastrointestinal: Positive for constipation. Negative for nausea, abdominal pain, diarrhea, blood in stool and abdominal distention.  Endocrine: Negative.   Genitourinary:       History BPH and urolithiasis.  Musculoskeletal:       Unstable on standing. Unable to walk any distance. Mild flexion contracture the left knee. History of left TKR.  Allergic/Immunologic: Negative.   Neurological:       Chronic memory loss to a moderate degree. Last MMSE 22/30. Failed clock drawing.  Hematological:       Acute blood loss anemia following surgery with hemoglobin 8.3.  Psychiatric/Behavioral: Positive for decreased concentration. Negative for behavioral problems, sleep disturbance and agitation. The patient is nervous/anxious.     Filed Vitals:   03/17/14 1310  BP: 120/80  Pulse: 82  Temp: 98.6 F (37 C)  Resp:  16  Height: 5\' 8"  (1.727 m)  Weight: 151 lb (68.493 kg)   Body mass index is 22.96 kg/(m^2).  Physical Exam  Constitutional: He appears well-developed and well-nourished. No distress.  HENT:  Mouth/Throat: No oropharyngeal exudate.  Bilateral hearing loss and use of hearing aids.  Neck: No JVD present. No tracheal deviation present. No thyromegaly present.  Cardiovascular: Normal rate, regular rhythm, normal heart sounds and intact distal pulses.  Exam reveals no gallop and no friction rub.   No murmur heard. Pulmonary/Chest: No respiratory distress. He has no wheezes. He has no rales. He exhibits no tenderness.  Abdominal: He exhibits no distension and no mass. There is no tenderness.    Musculoskeletal: He exhibits tenderness (Left knee). He exhibits no edema.  History of left TKR in September 2014. Incision is well-healed. There is a mild flexion contracture. He is unable to stand without assistance and is quite unstable. He has not been able to walk. There is mild discomfort with extension and flexion of the left knee.  Lymphadenopathy:    He has no cervical adenopathy.  Neurological: He is alert. No cranial nerve deficit. Coordination normal.  Skin: No rash noted. No erythema. No pallor.  Psychiatric: He has a normal mood and affect. His behavior is normal. Judgment and thought content normal.     Labs reviewed: No visits with results within 3 Month(s) from this visit. Latest known visit with results is:  Nursing Home on 12/05/2013  Component Date Value Ref Range Status  . Hemoglobin 12/01/2013 11.5* 13.5 - 17.5 g/dL Final  . HCT 12/01/2013 34* 41 - 53 % Final  . Platelets 12/01/2013 327  150 - 399 K/L Final  . WBC 12/01/2013 10.8   Final  . Glucose 12/01/2013 86   Final  . BUN 12/01/2013 29* 4 - 21 mg/dL Final  . Creatinine 12/01/2013 0.7  0.6 - 1.3 mg/dL Final  . Potassium 12/01/2013 4.3  3.4 - 5.3 mmol/L Final  . Sodium 12/01/2013 141  137 - 147 mmol/L Final  . Alkaline Phosphatase 12/01/2013 149* 25 - 125 U/L Final  . ALT 12/01/2013 9* 10 - 40 U/L Final  . AST 12/01/2013 13* 14 - 40 U/L Final  . Bilirubin, Total 12/01/2013 0.7   Final      Assessment/Plan  Osteoarthritis of left knee: Severe arthritis of the left knee led to a left totally replacement in September 2014 by Dr. Gladstone Lighter. He continues to have mild flexion contracture. He is unstable on standing. We will return him to Dr. Gladstone Lighter for an opinion regarding whether there is anything else to be done. It may be possible to refer him to occupational therapy for splinting for the contracture. I might also consider a brace to stabilize the knee and another round of physical therapy in this case.  Final opinion will rest with Dr. Gladstone Lighter prior to resuming any therapies or splinting.  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

## 2014-03-24 ENCOUNTER — Non-Acute Institutional Stay (SKILLED_NURSING_FACILITY): Payer: Medicare Other | Admitting: Nurse Practitioner

## 2014-03-24 ENCOUNTER — Encounter: Payer: Self-pay | Admitting: Nurse Practitioner

## 2014-03-24 DIAGNOSIS — F4323 Adjustment disorder with mixed anxiety and depressed mood: Secondary | ICD-10-CM

## 2014-03-24 DIAGNOSIS — K5900 Constipation, unspecified: Secondary | ICD-10-CM

## 2014-03-24 DIAGNOSIS — N400 Enlarged prostate without lower urinary tract symptoms: Secondary | ICD-10-CM

## 2014-03-24 DIAGNOSIS — E039 Hypothyroidism, unspecified: Secondary | ICD-10-CM

## 2014-03-24 DIAGNOSIS — R413 Other amnesia: Secondary | ICD-10-CM

## 2014-03-24 NOTE — Assessment & Plan Note (Signed)
Takes Tamsulosin. No urinary retention.    

## 2014-03-24 NOTE — Assessment & Plan Note (Signed)
Stable with Senna II qd, MiraLax dailly, Colace 100mg bid,  and prn Bisacodyl suppository prn  

## 2014-03-24 NOTE — Assessment & Plan Note (Signed)
Hx of anxiety and Ativan use. Better with Sertraline to 75mg   and Depakote 250mg  daily

## 2014-03-24 NOTE — Progress Notes (Signed)
Patient ID: William Roach, William Roach   DOB: 1921-03-12, 80 y.o.   MRN: [id number]   Code Status: DNR  No Known Allergies  Chief Complaint  Patient presents with  . Medical Management of Chronic Issues  . Acute Visit    left elbow abrasion.     HPI: Patient is a 80 y.o. William Roach seen in the SNF at [facility] today for evaluation of s/p fall and chronic medical conditions.  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 Namenda started and tolerated.        Constipation     Stable with Senna II qd, MiraLax dailly, Colace 100mg  bid,  and prn Bisacodyl suppository prn      BPH (benign prostatic hyperplasia)     Takes Tamsulosin. No urinary retention.      Adjustment disorder with mixed anxiety and depressed mood     Hx of anxiety and Ativan use. Better with Sertraline to 75mg   and Depakote 250mg  daily         Review of Systems:  Review of Systems  Constitutional: Negative for fever, chills, weight loss and malaise/fatigue.       Generalized weakness-no longer ambulates with walker and difficulty transferring self  HENT: Negative for congestion, ear discharge, ear pain, hearing loss, nosebleeds, sore throat and tinnitus.   Eyes: Negative for blurred vision, double vision, photophobia, pain, discharge and redness.  Respiratory: Negative for cough, hemoptysis, sputum production, shortness of breath, wheezing and stridor.   Cardiovascular: Positive for leg swelling. Negative for chest pain, palpitations, orthopnea, claudication and PND.       Trace LLE  Gastrointestinal: Negative for heartburn, nausea, vomiting, abdominal pain, diarrhea, constipation, blood in stool and melena.  Genitourinary: Negative for dysuria, urgency, frequency, hematuria and flank pain.  Musculoskeletal: Positive for  falls and joint pain. Negative for back pain, myalgias and neck pain.       Left knee-much better. [place] 03/06/14 In his room when transferred himself from w/c to bed w/o apparent injury noted.   Skin: Negative for itching and rash.       The left knee surgical incision is healed. Mild fever and erythema still presents. Left elbow skin abrasion  Neurological: Positive for sensory change and weakness. Negative for dizziness, tingling, tremors, speech change, focal weakness, seizures, loss of consciousness and headaches.       Numbness in BLE/feet in am-better once he is up. Difficulty ambulates with walker independently  Endo/Heme/Allergies: Negative for environmental allergies and polydipsia. Does not bruise/bleed easily.  Psychiatric/Behavioral: Positive for depression and memory loss. Negative for suicidal ideas, hallucinations and substance abuse. The patient is nervous/anxious. The patient does not have insomnia.        Sad facial looks-better/brighter     Past Medical History  Diagnosis Date  . Hypercholesterolemia   . Fatigue   . Basal cell carcinoma, ear     post -- partial pinnectomy left side with primary closure    . History of kidney stones     x1  . Acute blood loss anemia 06/30/2013  . Adjustment disorder with mixed anxiety and depressed mood 07/04/2013  . CAD (coronary artery disease) 07/04/2011    S/p CABG   . CVA, old, cognitive deficits 12/05/2013  . Dizziness and giddiness  10/07/2013  . Gait difficulty 02/17/2014  . HTN (hypertension) 07/04/2011  . Hyperlipidemia 07/04/2011  . Lack of coordination 11/08/2013  . Memory deficit 07/29/2013  . Numbness in both legs 10/31/2013  . Osteoarthritis of left knee 06/27/2013  . PAD (peripheral artery disease) 07/29/2013  . S/P knee replacement 07/01/2013  . Situational depression 10/17/2013  . Unspecified constipation 07/01/2013  . Unspecified hypothyroidism 07/01/2013     TSH 1.761 12/01/13     . Weakness 12/01/2013   Past Surgical History    Procedure Laterality Date  . Coronary artery bypass graft  2003    x4 --   . Cholecystectomy  2002  . Inguinal hernia repair  09/19/2007  . Ear cyst excision  12/14/2009    Excision of left auricular cyst with primary closure  . Basal cell carcinoma excision  01/14/2010    Wedge resection, partial pinnectomy left side with primary closure      . Tonsillectomy    . Carpal tunnel release    . Cataract surgery Bilateral 06-24-13  . Tonsillectomy    . Cystoscopy    . Total knee arthroplasty Left 06/27/2013    Procedure: LEFT TOTAL KNEE ARTHROPLASTY;  Surgeon: Tobi Bastos, MD;  Location: [facility];  Service: Orthopedics;  Laterality: Left;   Social History:   reports that he quit smoking about 32 years ago. He does not have any smokeless tobacco history on file. He reports that he does not drink alcohol or use illicit drugs.  Family History  Problem Relation Age of Onset  . Parkinsonism Father   . Angina Mother   . Coronary artery disease Brother     with CABG    Medications: Patient's Medications  New Prescriptions   No medications on file  Previous Medications   ASPIRIN 325 MG TABLET    Take 325 mg by mouth 2 (two) times daily.   BISACODYL (DULCOLAX) 10 MG SUPPOSITORY    Place 1 suppository (10 mg total) rectally daily as needed.   DOCUSATE SODIUM (COLACE) 100 MG CAPSULE    Take 100 mg by mouth 2 (two) times daily.   LEVOTHYROXINE (SYNTHROID, LEVOTHROID) 50 MCG TABLET    Take 50 mcg by mouth daily before breakfast.    LORAZEPAM (ATIVAN) 0.5 MG TABLET    0.5 mg every 4 (four) hours as needed.    MEMANTINE HCL ER (NAMENDA XR) 28 MG CP24    Take by mouth.   OXYCODONE (OXY IR/ROXICODONE) 5 MG IMMEDIATE RELEASE TABLET    Take 1-2 tablets (5-10 mg total) by mouth every 3 (three) hours as needed.   POLYETHYLENE GLYCOL (MIRALAX / GLYCOLAX) PACKET    Take 17 g by mouth daily as needed.   SENNA (SENOKOT) 8.6 MG TABLET    Take 2 tablets by mouth daily.    SERTRALINE (ZOLOFT) 25 MG  TABLET    Take 75 mg by mouth daily.   TAMSULOSIN (FLOMAX) 0.4 MG CAPS CAPSULE    Take 1 capsule (0.4 mg total) by mouth daily.  Modified Medications   No medications on file  Discontinued Medications   No medications on file     Physical Exam: Physical Exam  Constitutional: He is oriented to person, place, and time. He appears well-developed and well-nourished. No distress.  generalized weakness. No longer transferring self and ambulates with walker.   HENT:  Head: Normocephalic and atraumatic.  Right Ear: External ear normal.  Left Ear: External ear normal.  Nose: Nose normal.  Mouth/Throat: Oropharynx  is clear and moist. No oropharyngeal exudate.  Eyes: Conjunctivae and EOM are normal. Pupils are equal, round, and reactive to light. Right eye exhibits no discharge. Left eye exhibits no discharge. No scleral icterus.  Neck: Normal range of motion. Neck supple. No JVD present. No tracheal deviation present. No thyromegaly present.  Cardiovascular: Normal rate, regular rhythm, normal heart sounds and intact distal pulses.   No murmur heard. Pulmonary/Chest: Effort normal. No stridor. No respiratory distress. He has no wheezes. He has rales. He exhibits no tenderness.  Dry rales bases.   Abdominal: Soft. Bowel sounds are normal. He exhibits no distension. There is no tenderness. There is no rebound and no guarding.  Genitourinary: Penis normal.  Musculoskeletal: Normal range of motion. He exhibits edema and tenderness.   Left knee mild swelling and warmth with limited flexion and extension ROM.   Lymphadenopathy:    He has no cervical adenopathy.  Neurological: He is alert and oriented to person, place, and time. He has normal reflexes. No cranial nerve deficit. He exhibits normal muscle tone. Coordination abnormal.  Decreased coordination of the RUE-improving.   Skin: Skin is warm and dry. No rash noted. He is not diaphoretic. There is erythema. No pallor.  The left knee surgical  incision is healed. Mild erythema Left knee. Left elbow abrasion  Psychiatric: His speech is normal. Judgment and thought content normal. His mood appears anxious. He is slowed and withdrawn. Cognition and memory are impaired. He exhibits abnormal recent memory.  Flat affect-improved.     Filed Vitals:   03/24/14 1244  BP: 127/71  Pulse: 68  Temp: 97.6 F (36.4 C)  TempSrc: Tympanic  Resp: 18      Labs reviewed: Basic Metabolic Panel:  Recent Labs  06/24/13 1430 06/28/13 0541 06/29/13 0540  07/07/13 10/13/13 10/29/13 12/01/13  NA 136 136 135  < > 138 142 138 141  K 4.9 4.1 3.7  --  3.8 4.4 3.9 4.3  CL 101 103 102  --   --   --   --   --   CO2 26 26 24   --   --   --   --   --   GLUCOSE 97 115* 121*  --   --   --   --   --   BUN 31* 19 22  < > 20 26* 19 29*  CREATININE 0.98 0.83 0.81  < > 0.7 0.8 0.7 0.7  CALCIUM 9.7 8.6 8.5  --   --   --   --   --   TSH  --   --   --   --  2.35  --   --   --   < > = values in this interval not displayed. Liver Function Tests:  Recent Labs  04/09/13 1030 05/14/13 0927 06/24/13 1430 07/07/13 10/29/13 12/01/13  AST 83* 33 30 19 14  13*  ALT 137* 40 22 19 9* 9*  ALKPHOS 76 71 87 114 98 149*  BILITOT 1.0 1.1 0.6  --   --   --   PROT 6.6 6.3 6.7  --   --   --   ALBUMIN 4.1 3.9 3.9  --   --   --    CBC:  Recent Labs  06/29/13 0540 06/30/13 0425 06/30/13 1515 07/01/13 0428  10/13/13 10/29/13 12/01/13  WBC 12.9* 9.7  --  9.3  < > 8.0 11.7 10.8  NEUTROABS  --   --   --  7.0  --   --   --   --   HGB 9.6* 8.3* 8.6* 8.1*  < > 14.6 12.8* 11.5*  HCT 28.2* 24.9* 24.9* 23.5*  < > 43 38* 34*  MCV 93.1 92.6  --  92.5  --   --   --   --   PLT 113* 92*  --  117*  < > 192 199 327  < > = values in this interval not displayed. Lipid Panel:  Recent Labs  04/09/13 1030  CHOL 138  HDL 40.10  LDLCALC 69  TRIG 144.0  CHOLHDL 3    Past Procedures:  10/28/13 CXR minimal cardiomegaly wihtout pulmonary vascular congestion, no pleural  effusion, patchy bibasilar pneumonitis.   12/02/13 CT with and without contrast: atrophy and chronic left external capsule infarct. No acute abnormality.   Assessment/Plan Unspecified hypothyroidism Takes Levothyroxine 80mcg, TSH 2.35 07/07/13 and 1.761 12/01/13     Adjustment disorder with mixed anxiety and depressed mood Hx of anxiety and Ativan use. 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 Namenda started and tolerated.      BPH (benign prostatic hyperplasia) Takes Tamsulosin. No urinary retention.    Constipation Stable with Senna II qd, MiraLax dailly, Colace 100mg  bid,  and prn Bisacodyl suppository prn      Family/ Staff Communication: observe the patient. PT to evaluate and treat   Goals of Care: SNF  Labs/tests ordered: none

## 2014-03-24 NOTE — Assessment & Plan Note (Signed)
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 Namenda started and tolerated.   

## 2014-03-24 NOTE — Assessment & Plan Note (Signed)
Takes Levothyroxine 82mcg, TSH 2.35 07/07/13 and 1.761 12/01/13

## 2014-04-21 ENCOUNTER — Encounter: Payer: Self-pay | Admitting: Nurse Practitioner

## 2014-04-21 ENCOUNTER — Non-Acute Institutional Stay (SKILLED_NURSING_FACILITY): Payer: Medicare Other | Admitting: Nurse Practitioner

## 2014-04-21 DIAGNOSIS — M1710 Unilateral primary osteoarthritis, unspecified knee: Secondary | ICD-10-CM

## 2014-04-21 DIAGNOSIS — K5900 Constipation, unspecified: Secondary | ICD-10-CM

## 2014-04-21 DIAGNOSIS — M1712 Unilateral primary osteoarthritis, left knee: Secondary | ICD-10-CM

## 2014-04-21 DIAGNOSIS — R413 Other amnesia: Secondary | ICD-10-CM

## 2014-04-21 DIAGNOSIS — F4323 Adjustment disorder with mixed anxiety and depressed mood: Secondary | ICD-10-CM

## 2014-04-21 DIAGNOSIS — N400 Enlarged prostate without lower urinary tract symptoms: Secondary | ICD-10-CM

## 2014-04-21 DIAGNOSIS — R269 Unspecified abnormalities of gait and mobility: Secondary | ICD-10-CM

## 2014-04-21 DIAGNOSIS — E039 Hypothyroidism, unspecified: Secondary | ICD-10-CM

## 2014-04-21 NOTE — Assessment & Plan Note (Signed)
Hx of anxiety and Ativan use. Better with Sertraline 75mg   and Depakote 250mg  daily

## 2014-04-21 NOTE — Assessment & Plan Note (Signed)
Takes Tamsulosin. No urinary retention.    

## 2014-04-21 NOTE — Progress Notes (Signed)
Patient ID: William Roach, male   DOB: Dec 06, 1920, 80 y.o.   MRN: [id number]   Code Status: DNR  No Known Allergies  Chief Complaint  Patient presents with  . Medical Management of Chronic Issues    HPI: Patient is a 80 y.o. male seen in the SNF at [facility] today for evaluation of chronic medical conditions.  Problem List Items Addressed This Visit   Osteoarthritis of left knee     04/20/14 Ortho: needs 5Ibs on ankle and let his leg extend with the weight  S/p total left joint replacement surgery.      BPH (benign prostatic hyperplasia)     Takes Tamsulosin. No urinary retention.      Unspecified hypothyroidism - Primary     Takes Levothyroxine 69mcg, TSH 2.35 07/07/13 and 1.761 12/01/13       Adjustment disorder with mixed anxiety and depressed mood     Hx of anxiety and Ativan use. 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 Namenda started and tolerated.        Gait difficulty     W/c for mobility for now.     Constipation     Stable with Senna II qd, MiraLax dailly, Colace 100mg  bid,  and prn Bisacodyl suppository prn         Review of Systems:  Review of Systems  Constitutional: Negative for fever, chills, weight loss and malaise/fatigue.       Generalized weakness-no longer ambulates with walker and difficulty transferring self  HENT: Negative for congestion, ear discharge, ear pain, hearing loss, nosebleeds, sore throat and tinnitus.   Eyes: Negative for blurred vision, double vision, photophobia, pain, discharge and redness.  Respiratory: Negative for cough, hemoptysis, sputum production, shortness of breath, wheezing and stridor.   Cardiovascular: Positive for leg swelling. Negative for chest pain, palpitations, orthopnea, claudication and PND.       Trace LLE  Gastrointestinal: Negative for heartburn, nausea, vomiting, abdominal pain, diarrhea,  constipation, blood in stool and melena.  Genitourinary: Negative for dysuria, urgency, frequency, hematuria and flank pain.  Musculoskeletal: Positive for falls and joint pain. Negative for back pain, myalgias and neck pain.       Left knee-much better. [place] 03/06/14 In his room when transferred himself from w/c to bed w/o apparent injury noted.   Skin: Negative for itching and rash.       The left knee surgical incision is healed. Mild fever and erythema still presents. Left elbow skin abrasion  Neurological: Positive for sensory change and weakness. Negative for dizziness, tingling, tremors, speech change, focal weakness, seizures, loss of consciousness and headaches.       Numbness in BLE/feet in am-better once he is up. Difficulty ambulates with walker independently  Endo/Heme/Allergies: Negative for environmental allergies and polydipsia. Does not bruise/bleed easily.  Psychiatric/Behavioral: Positive for depression and memory loss. Negative for suicidal ideas, hallucinations and substance abuse. The patient is nervous/anxious. The patient does not have insomnia.        Sad facial looks-better/brighter     Past Medical History  Diagnosis Date  . Hypercholesterolemia   . Fatigue   . Basal cell carcinoma, ear     post -- partial pinnectomy left side with primary closure    . History of kidney stones     x1  . Acute blood loss anemia 06/30/2013  .  Adjustment disorder with mixed anxiety and depressed mood 07/04/2013  . CAD (coronary artery disease) 07/04/2011    S/p CABG   . CVA, old, cognitive deficits 12/05/2013  . Dizziness and giddiness 10/07/2013  . Gait difficulty 02/17/2014  . HTN (hypertension) 07/04/2011  . Hyperlipidemia 07/04/2011  . Lack of coordination 11/08/2013  . Memory deficit 07/29/2013  . Numbness in both legs 10/31/2013  . Osteoarthritis of left knee 06/27/2013  . PAD (peripheral artery disease) 07/29/2013  . S/P knee replacement 07/01/2013  . Situational depression  10/17/2013  . Unspecified constipation 07/01/2013  . Unspecified hypothyroidism 07/01/2013     TSH 1.761 12/01/13     . Weakness 12/01/2013   Past Surgical History  Procedure Laterality Date  . Coronary artery bypass graft  2003    x4 --   . Cholecystectomy  2002  . Inguinal hernia repair  09/19/2007  . Ear cyst excision  12/14/2009    Excision of left auricular cyst with primary closure  . Basal cell carcinoma excision  01/14/2010    Wedge resection, partial pinnectomy left side with primary closure      . Tonsillectomy    . Carpal tunnel release    . Cataract surgery Bilateral 06-24-13  . Tonsillectomy    . Cystoscopy    . Total knee arthroplasty Left 06/27/2013    Procedure: LEFT TOTAL KNEE ARTHROPLASTY;  Surgeon: Tobi Bastos, MD;  Location: [facility];  Service: Orthopedics;  Laterality: Left;   Social History:   reports that he quit smoking about 32 years ago. He does not have any smokeless tobacco history on file. He reports that he does not drink alcohol or use illicit drugs.  Family History  Problem Relation Age of Onset  . Parkinsonism Father   . Angina Mother   . Coronary artery disease Brother     with CABG    Medications: Patient's Medications  New Prescriptions   No medications on file  Previous Medications   ASPIRIN 325 MG TABLET    Take 325 mg by mouth 2 (two) times daily.   BISACODYL (DULCOLAX) 10 MG SUPPOSITORY    Place 1 suppository (10 mg total) rectally daily as needed.   DOCUSATE SODIUM (COLACE) 100 MG CAPSULE    Take 100 mg by mouth 2 (two) times daily.   LEVOTHYROXINE (SYNTHROID, LEVOTHROID) 50 MCG TABLET    Take 50 mcg by mouth daily before breakfast.    LORAZEPAM (ATIVAN) 0.5 MG TABLET    0.5 mg every 4 (four) hours as needed.    MEMANTINE HCL ER (NAMENDA XR) 28 MG CP24    Take by mouth.   OXYCODONE (OXY IR/ROXICODONE) 5 MG IMMEDIATE RELEASE TABLET    Take 1-2 tablets (5-10 mg total) by mouth every 3 (three) hours as needed.   POLYETHYLENE GLYCOL  (MIRALAX / GLYCOLAX) PACKET    Take 17 g by mouth daily as needed.   SENNA (SENOKOT) 8.6 MG TABLET    Take 2 tablets by mouth daily.    SERTRALINE (ZOLOFT) 25 MG TABLET    Take 75 mg by mouth daily.   TAMSULOSIN (FLOMAX) 0.4 MG CAPS CAPSULE    Take 1 capsule (0.4 mg total) by mouth daily.  Modified Medications   No medications on file  Discontinued Medications   No medications on file     Physical Exam: Physical Exam  Constitutional: He is oriented to person, place, and time. He appears well-developed and well-nourished. No distress.  generalized weakness. No longer  transferring self and ambulates with walker.   HENT:  Head: Normocephalic and atraumatic.  Right Ear: External ear normal.  Left Ear: External ear normal.  Nose: Nose normal.  Mouth/Throat: Oropharynx is clear and moist. No oropharyngeal exudate.  Eyes: Conjunctivae and EOM are normal. Pupils are equal, round, and reactive to light. Right eye exhibits no discharge. Left eye exhibits no discharge. No scleral icterus.  Neck: Normal range of motion. Neck supple. No JVD present. No tracheal deviation present. No thyromegaly present.  Cardiovascular: Normal rate, regular rhythm, normal heart sounds and intact distal pulses.   No murmur heard. Pulmonary/Chest: Effort normal. No stridor. No respiratory distress. He has no wheezes. He has rales. He exhibits no tenderness.  Dry rales bases.   Abdominal: Soft. Bowel sounds are normal. He exhibits no distension. There is no tenderness. There is no rebound and no guarding.  Genitourinary: Penis normal.  Musculoskeletal: Normal range of motion. He exhibits edema and tenderness.   Left knee mild swelling and warmth with limited flexion and extension ROM.   Lymphadenopathy:    He has no cervical adenopathy.  Neurological: He is alert and oriented to person, place, and time. He has normal reflexes. No cranial nerve deficit. He exhibits normal muscle tone. Coordination abnormal.    Decreased coordination of the RUE-improving.   Skin: Skin is warm and dry. No rash noted. He is not diaphoretic. There is erythema. No pallor.  The left knee surgical incision is healed. Mild erythema Left knee. Left elbow abrasion  Psychiatric: His speech is normal. Judgment and thought content normal. His mood appears anxious. He is slowed and withdrawn. Cognition and memory are impaired. He exhibits abnormal recent memory.  Flat affect-improved.     Filed Vitals:   04/21/14 1352  BP: 132/73  Pulse: 70  Temp: 97.1 F (36.2 C)  TempSrc: Tympanic  Resp: 18      Labs reviewed: Basic Metabolic Panel:  Recent Labs  06/24/13 1430 06/28/13 0541 06/29/13 0540  07/07/13 10/13/13 10/29/13 12/01/13  NA 136 136 135  < > 138 142 138 141  K 4.9 4.1 3.7  --  3.8 4.4 3.9 4.3  CL 101 103 102  --   --   --   --   --   CO2 26 26 24   --   --   --   --   --   GLUCOSE 97 115* 121*  --   --   --   --   --   BUN 31* 19 22  < > 20 26* 19 29*  CREATININE 0.98 0.83 0.81  < > 0.7 0.8 0.7 0.7  CALCIUM 9.7 8.6 8.5  --   --   --   --   --   TSH  --   --   --   --  2.35  --   --   --   < > = values in this interval not displayed. Liver Function Tests:  Recent Labs  05/14/13 0927 06/24/13 1430 07/07/13 10/29/13 12/01/13  AST 33 30 19 14  13*  ALT 40 22 19 9* 9*  ALKPHOS 71 87 114 98 149*  BILITOT 1.1 0.6  --   --   --   PROT 6.3 6.7  --   --   --   ALBUMIN 3.9 3.9  --   --   --    CBC:  Recent Labs  06/29/13 0540 06/30/13 0425 06/30/13 1515 07/01/13 0428  10/13/13 10/29/13  12/01/13  WBC 12.9* 9.7  --  9.3  < > 8.0 11.7 10.8  NEUTROABS  --   --   --  7.0  --   --   --   --   HGB 9.6* 8.3* 8.6* 8.1*  < > 14.6 12.8* 11.5*  HCT 28.2* 24.9* 24.9* 23.5*  < > 43 38* 34*  MCV 93.1 92.6  --  92.5  --   --   --   --   PLT 113* 92*  --  117*  < > 192 199 327  < > = values in this interval not displayed. Lipid Panel: No results found for this basename: CHOL, HDL, LDLCALC, TRIG, CHOLHDL,  LDLDIRECT,  in the last 8760 hours  Past Procedures:  10/28/13 CXR minimal cardiomegaly wihtout pulmonary vascular congestion, no pleural effusion, patchy bibasilar pneumonitis.   12/02/13 CT with and without contrast: atrophy and chronic left external capsule infarct. No acute abnormality.   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 Namenda started and tolerated.      Adjustment disorder with mixed anxiety and depressed mood Hx of anxiety and Ativan use. Better with Sertraline 75mg   and Depakote 250mg  daily     Constipation Stable with Senna II qd, MiraLax dailly, Colace 100mg  bid,  and prn Bisacodyl suppository prn    BPH (benign prostatic hyperplasia) Takes Tamsulosin. No urinary retention.    Osteoarthritis of left knee 04/20/14 Ortho: needs 5Ibs on ankle and let his leg extend with the weight  S/p total left joint replacement surgery.    Gait difficulty W/c for mobility for now.     Family/ Staff Communication: observe the patient. PT to evaluate and treat   Goals of Care: SNF  Labs/tests ordered: none

## 2014-04-21 NOTE — Assessment & Plan Note (Signed)
04/20/14 Ortho: needs 5Ibs on ankle and let his leg extend with the weight  S/p total left joint replacement surgery.

## 2014-04-21 NOTE — Assessment & Plan Note (Signed)
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 Namenda started and tolerated.   

## 2014-04-21 NOTE — Assessment & Plan Note (Signed)
Stable with Senna II qd, MiraLax dailly, Colace 100mg bid,  and prn Bisacodyl suppository prn  

## 2014-04-21 NOTE — Assessment & Plan Note (Signed)
W/c for mobility for now.

## 2014-04-21 NOTE — Assessment & Plan Note (Signed)
Takes Levothyroxine 66mcg, TSH 2.35 07/07/13 and 1.761 12/01/13

## 2014-04-28 ENCOUNTER — Non-Acute Institutional Stay (SKILLED_NURSING_FACILITY): Payer: Medicare Other | Admitting: Nurse Practitioner

## 2014-04-28 ENCOUNTER — Encounter: Payer: Self-pay | Admitting: Nurse Practitioner

## 2014-04-28 DIAGNOSIS — E039 Hypothyroidism, unspecified: Secondary | ICD-10-CM

## 2014-04-28 DIAGNOSIS — L259 Unspecified contact dermatitis, unspecified cause: Secondary | ICD-10-CM

## 2014-04-28 DIAGNOSIS — N400 Enlarged prostate without lower urinary tract symptoms: Secondary | ICD-10-CM

## 2014-04-28 DIAGNOSIS — F4323 Adjustment disorder with mixed anxiety and depressed mood: Secondary | ICD-10-CM

## 2014-04-28 DIAGNOSIS — L309 Dermatitis, unspecified: Secondary | ICD-10-CM | POA: Insufficient documentation

## 2014-04-28 DIAGNOSIS — K5900 Constipation, unspecified: Secondary | ICD-10-CM

## 2014-04-28 DIAGNOSIS — R413 Other amnesia: Secondary | ICD-10-CM

## 2014-04-28 NOTE — Assessment & Plan Note (Signed)
Hx of anxiety and Ativan use. Better with Sertraline 75mg   and Depakote 250mg  daily

## 2014-04-28 NOTE — Progress Notes (Signed)
Patient ID: William Roach, male   DOB: [birth year], 80 y.o.   MRN: [id number]   Code Status: DNR  No Known Allergies  Chief Complaint  Patient presents with  . Medical Management of Chronic Issues  . Acute Visit    right arm itching rash    HPI: Patient is a 80 y.o. male seen in the SNF at [facility] today for evaluation of patchy pruritic rash R forearm and chronic medical conditions.  Problem List Items Addressed This Visit   BPH (benign prostatic hyperplasia)     Takes Tamsulosin. No urinary retention.       Unspecified hypothyroidism     Takes Levothyroxine 28mcg, TSH 2.35 07/07/13 and 1.761 12/01/13      Adjustment disorder with mixed anxiety and depressed mood     Hx of anxiety and Ativan use. 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 Namenda started and tolerated.       Constipation     Stable with Senna II qd, MiraLax dailly, Colace 100mg  bid,  and prn Bisacodyl suppository prn       Dermatitis - Primary     Right medial posterior aspect fore arm 5cmx5cm circular slightly raised beefy red pruritic rash. Will apply Mycolog II cream bid-observe.         Review of Systems:  Review of Systems  Constitutional: Negative for fever, chills, weight loss and malaise/fatigue.       Generalized weakness-no longer ambulates with walker and difficulty transferring self  HENT: Negative for congestion, ear discharge, ear pain, hearing loss, nosebleeds, sore throat and tinnitus.   Eyes: Negative for blurred vision, double vision, photophobia, pain, discharge and redness.  Respiratory: Negative for cough, hemoptysis, sputum production, shortness of breath, wheezing and stridor.   Cardiovascular: Positive for leg swelling. Negative for chest pain, palpitations, orthopnea, claudication and PND.       Trace LLE  Gastrointestinal: Negative for heartburn, nausea, vomiting,  abdominal pain, diarrhea, constipation, blood in stool and melena.  Genitourinary: Negative for dysuria, urgency, frequency, hematuria and flank pain.  Musculoskeletal: Positive for falls and joint pain. Negative for back pain, myalgias and neck pain.       Left knee-much better. [place] 03/06/14 In his room when transferred himself from w/c to bed w/o apparent injury noted.   Skin: Negative for itching and rash.       The left knee surgical incision is healed. Mild fever and erythema still presents. Right medial posterior aspect fore arm 5cmx5cm circular slightly raised beefy red pruritic rash.  Neurological: Positive for sensory change and weakness. Negative for dizziness, tingling, tremors, speech change, focal weakness, seizures, loss of consciousness and headaches.       Numbness in BLE/feet in am-better once he is up. Difficulty ambulates with walker independently  Endo/Heme/Allergies: Negative for environmental allergies and polydipsia. Does not bruise/bleed easily.  Psychiatric/Behavioral: Positive for depression and memory loss. Negative for suicidal ideas, hallucinations and substance abuse. The patient is nervous/anxious. The patient does not have insomnia.        Sad facial looks-better/brighter     Past Medical History  Diagnosis Date  . Hypercholesterolemia   . Fatigue   . Basal cell carcinoma, ear     post -- partial pinnectomy left side with primary closure    . History of kidney stones     x1  .  Acute blood loss anemia 06/30/2013  . Adjustment disorder with mixed anxiety and depressed mood 07/04/2013  . CAD (coronary artery disease) 07/04/2011    S/p CABG   . CVA, old, cognitive deficits 12/05/2013  . Dizziness and giddiness 10/07/2013  . Gait difficulty 02/17/2014  . HTN (hypertension) 07/04/2011  . Hyperlipidemia 07/04/2011  . Lack of coordination 11/08/2013  . Memory deficit 07/29/2013  . Numbness in both legs 10/31/2013  . Osteoarthritis of left knee 06/27/2013  . PAD (peripheral  artery disease) 07/29/2013  . S/P knee replacement 07/01/2013  . Situational depression 10/17/2013  . Unspecified constipation 07/01/2013  . Unspecified hypothyroidism 07/01/2013     TSH 1.761 12/01/13     . Weakness 12/01/2013   Past Surgical History  Procedure Laterality Date  . Coronary artery bypass graft  2003    x4 --   . Cholecystectomy  2002  . Inguinal hernia repair  09/19/2007  . Ear cyst excision  12/14/2009    Excision of left auricular cyst with primary closure  . Basal cell carcinoma excision  01/14/2010    Wedge resection, partial pinnectomy left side with primary closure      . Tonsillectomy    . Carpal tunnel release    . Cataract surgery Bilateral 06-24-13  . Tonsillectomy    . Cystoscopy    . Total knee arthroplasty Left 06/27/2013    Procedure: LEFT TOTAL KNEE ARTHROPLASTY;  Surgeon: Tobi Bastos, MD;  Location: [facility];  Service: Orthopedics;  Laterality: Left;   Social History:   reports that he quit smoking about 32 years ago. He does not have any smokeless tobacco history on file. He reports that he does not drink alcohol or use illicit drugs.  Family History  Problem Relation Age of Onset  . Parkinsonism Father   . Angina Mother   . Coronary artery disease Brother     with CABG    Medications: Patient's Medications  New Prescriptions   No medications on file  Previous Medications   ASPIRIN 325 MG TABLET    Take 325 mg by mouth 2 (two) times daily.   BISACODYL (DULCOLAX) 10 MG SUPPOSITORY    Place 1 suppository (10 mg total) rectally daily as needed.   DOCUSATE SODIUM (COLACE) 100 MG CAPSULE    Take 100 mg by mouth 2 (two) times daily.   LEVOTHYROXINE (SYNTHROID, LEVOTHROID) 50 MCG TABLET    Take 50 mcg by mouth daily before breakfast.    LORAZEPAM (ATIVAN) 0.5 MG TABLET    0.5 mg every 4 (four) hours as needed.    MEMANTINE HCL ER (NAMENDA XR) 28 MG CP24    Take by mouth.   OXYCODONE (OXY IR/ROXICODONE) 5 MG IMMEDIATE RELEASE TABLET    Take 1-2  tablets (5-10 mg total) by mouth every 3 (three) hours as needed.   POLYETHYLENE GLYCOL (MIRALAX / GLYCOLAX) PACKET    Take 17 g by mouth daily as needed.   SENNA (SENOKOT) 8.6 MG TABLET    Take 2 tablets by mouth daily.    SERTRALINE (ZOLOFT) 25 MG TABLET    Take 75 mg by mouth daily.   TAMSULOSIN (FLOMAX) 0.4 MG CAPS CAPSULE    Take 1 capsule (0.4 mg total) by mouth daily.  Modified Medications   No medications on file  Discontinued Medications   No medications on file     Physical Exam: Physical Exam  Constitutional: He is oriented to person, place, and time. He appears well-developed and well-nourished.  No distress.  generalized weakness. No longer transferring self and ambulates with walker.   HENT:  Head: Normocephalic and atraumatic.  Right Ear: External ear normal.  Left Ear: External ear normal.  Nose: Nose normal.  Mouth/Throat: Oropharynx is clear and moist. No oropharyngeal exudate.  Eyes: Conjunctivae and EOM are normal. Pupils are equal, round, and reactive to light. Right eye exhibits no discharge. Left eye exhibits no discharge. No scleral icterus.  Neck: Normal range of motion. Neck supple. No JVD present. No tracheal deviation present. No thyromegaly present.  Cardiovascular: Normal rate, regular rhythm, normal heart sounds and intact distal pulses.   No murmur heard. Pulmonary/Chest: Effort normal. No stridor. No respiratory distress. He has no wheezes. He has rales. He exhibits no tenderness.  Dry rales bases.   Abdominal: Soft. Bowel sounds are normal. He exhibits no distension. There is no tenderness. There is no rebound and no guarding.  Genitourinary: Penis normal.  Musculoskeletal: Normal range of motion. He exhibits edema and tenderness.   Left knee mild swelling and warmth with limited flexion and extension ROM.   Lymphadenopathy:    He has no cervical adenopathy.  Neurological: He is alert and oriented to person, place, and time. He has normal reflexes.  No cranial nerve deficit. He exhibits normal muscle tone. Coordination abnormal.  Decreased coordination of the RUE-improving.   Skin: Skin is warm and dry. No rash noted. He is not diaphoretic. There is erythema. No pallor.  The left knee surgical incision is healed. Mild erythema Left knee. Right medial posterior aspect fore arm 5cmx5cm circular slightly raised beefy red pruritic rash.   Psychiatric: His speech is normal. Judgment and thought content normal. His mood appears anxious. He is slowed and withdrawn. Cognition and memory are impaired. He exhibits abnormal recent memory.  Flat affect-improved.     Filed Vitals:   04/28/14 1245  BP: 134/74  Pulse: 68  Temp: 98.7 F (37.1 C)  TempSrc: Tympanic  Resp: 20      Labs reviewed: Basic Metabolic Panel:  Recent Labs  06/24/13 1430 06/28/13 0541 06/29/13 0540  07/07/13 10/13/13 10/29/13 12/01/13  NA 136 136 135  < > 138 142 138 141  K 4.9 4.1 3.7  --  3.8 4.4 3.9 4.3  CL 101 103 102  --   --   --   --   --   CO2 26 26 24   --   --   --   --   --   GLUCOSE 97 115* 121*  --   --   --   --   --   BUN 31* 19 22  < > 20 26* 19 29*  CREATININE 0.98 0.83 0.81  < > 0.7 0.8 0.7 0.7  CALCIUM 9.7 8.6 8.5  --   --   --   --   --   TSH  --   --   --   --  2.35  --   --   --   < > = values in this interval not displayed. Liver Function Tests:  Recent Labs  05/14/13 0927 06/24/13 1430 07/07/13 10/29/13 12/01/13  AST 33 30 19 14  13*  ALT 40 22 19 9* 9*  ALKPHOS 71 87 114 98 149*  BILITOT 1.1 0.6  --   --   --   PROT 6.3 6.7  --   --   --   ALBUMIN 3.9 3.9  --   --   --  CBC:  Recent Labs  06/29/13 0540 06/30/13 0425 06/30/13 1515 07/01/13 0428  10/13/13 10/29/13 12/01/13  WBC 12.9* 9.7  --  9.3  < > 8.0 11.7 10.8  NEUTROABS  --   --   --  7.0  --   --   --   --   HGB 9.6* 8.3* 8.6* 8.1*  < > 14.6 12.8* 11.5*  HCT 28.2* 24.9* 24.9* 23.5*  < > 43 38* 34*  MCV 93.1 92.6  --  92.5  --   --   --   --   PLT 113* 92*  --   117*  < > 192 199 327  < > = values in this interval not displayed. Lipid Panel: No results found for this basename: CHOL, HDL, LDLCALC, TRIG, CHOLHDL, LDLDIRECT,  in the last 8760 hours  Past Procedures:  10/28/13 CXR minimal cardiomegaly wihtout pulmonary vascular congestion, no pleural effusion, patchy bibasilar pneumonitis.   12/02/13 CT with and without contrast: atrophy and chronic left external capsule infarct. No acute abnormality.   Assessment/Plan Dermatitis Right medial posterior aspect fore arm 5cmx5cm circular slightly raised beefy red pruritic rash. Will apply Mycolog II cream bid-observe.    Unspecified hypothyroidism Takes Levothyroxine 49mcg, TSH 2.35 07/07/13 and 1.761 12/01/13    Adjustment disorder with mixed anxiety and depressed mood Hx of anxiety and Ativan use. 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 Namenda started and tolerated.     Constipation Stable with Senna II qd, MiraLax dailly, Colace 100mg  bid,  and prn Bisacodyl suppository prn     BPH (benign prostatic hyperplasia) Takes Tamsulosin. No urinary retention.       Family/ Staff Communication: observe the patient. PT to evaluate and treat   Goals of Care: SNF  Labs/tests ordered: none

## 2014-04-28 NOTE — Assessment & Plan Note (Signed)
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 Namenda started and tolerated.   

## 2014-04-28 NOTE — Assessment & Plan Note (Signed)
Stable with Senna II qd, MiraLax dailly, Colace 100mg bid,  and prn Bisacodyl suppository prn  

## 2014-04-28 NOTE — Assessment & Plan Note (Signed)
Takes Tamsulosin. No urinary retention.    

## 2014-04-28 NOTE — Assessment & Plan Note (Signed)
Right medial posterior aspect fore arm 5cmx5cm circular slightly raised beefy red pruritic rash. Will apply Mycolog II cream bid-observe.

## 2014-04-28 NOTE — Assessment & Plan Note (Signed)
Takes Levothyroxine 52mcg, TSH 2.35 07/07/13 and 1.761 12/01/13

## 2014-05-12 ENCOUNTER — Non-Acute Institutional Stay (SKILLED_NURSING_FACILITY): Payer: Medicare Other | Admitting: Nurse Practitioner

## 2014-05-12 ENCOUNTER — Encounter: Payer: Self-pay | Admitting: Nurse Practitioner

## 2014-05-12 DIAGNOSIS — F4321 Adjustment disorder with depressed mood: Secondary | ICD-10-CM

## 2014-05-12 DIAGNOSIS — B351 Tinea unguium: Secondary | ICD-10-CM | POA: Insufficient documentation

## 2014-05-12 DIAGNOSIS — E039 Hypothyroidism, unspecified: Secondary | ICD-10-CM

## 2014-05-12 DIAGNOSIS — K5900 Constipation, unspecified: Secondary | ICD-10-CM

## 2014-05-12 DIAGNOSIS — R413 Other amnesia: Secondary | ICD-10-CM

## 2014-05-12 DIAGNOSIS — N400 Enlarged prostate without lower urinary tract symptoms: Secondary | ICD-10-CM

## 2014-05-12 NOTE — Progress Notes (Signed)
Patient ID: William Roach, male   DOB: 1921/06/01, 80 y.o.   MRN: [id number]   Code Status: DNR  No Known Allergies  Chief Complaint  Patient presents with  . Medical Management of Chronic Issues  . Acute Visit    left great toe nail-yellow, thick, brittle.     HPI: Patient is a 80 y.o. male seen in the SNF at [facility] today for evaluation of left great toe nail thick/yellow/brittle and chronic medical conditions.  Problem List Items Addressed This Visit   BPH (benign prostatic hyperplasia)     Takes Tamsulosin. No urinary retention.        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 Namenda started and tolerated.        Situational depression     Hx of anxiety and Ativan use. Better with Sertraline 75mg   and Depakote 250mg  daily      Constipation     Stable with Senna II qd, MiraLax dailly, Colace 100mg  bid,  and prn Bisacodyl suppository prn      Onychomycosis of left great toe - Primary     Observe.        Review of Systems:  Review of Systems  Constitutional: Negative for fever, chills, weight loss and malaise/fatigue.       Generalized weakness-no longer ambulates with walker and difficulty transferring self  HENT: Negative for congestion, ear discharge, ear pain, hearing loss, nosebleeds, sore throat and tinnitus.   Eyes: Negative for blurred vision, double vision, photophobia, pain, discharge and redness.  Respiratory: Negative for cough, hemoptysis, sputum production, shortness of breath, wheezing and stridor.   Cardiovascular: Positive for leg swelling. Negative for chest pain, palpitations, orthopnea, claudication and PND.       Trace LLE  Gastrointestinal: Negative for heartburn, nausea, vomiting, abdominal pain, diarrhea, constipation, blood in stool and melena.  Genitourinary: Negative for dysuria, urgency,  frequency, hematuria and flank pain.  Musculoskeletal: Positive for falls and joint pain. Negative for back pain, myalgias and neck pain.       Left knee-much better. [place] 03/06/14 In his room when transferred himself from w/c to bed w/o apparent injury noted.   Skin: Negative for itching and rash.       The left knee surgical incision is healed. Mild fever and erythema still presents. Thick, yellow, brittle left great toe nail.   Neurological: Positive for sensory change and weakness. Negative for dizziness, tingling, tremors, speech change, focal weakness, seizures, loss of consciousness and headaches.       Numbness in BLE/feet in am-better once he is up. Difficulty ambulates with walker independently  Endo/Heme/Allergies: Negative for environmental allergies and polydipsia. Does not bruise/bleed easily.  Psychiatric/Behavioral: Positive for depression and memory loss. Negative for suicidal ideas, hallucinations and substance abuse. The patient is nervous/anxious. The patient does not have insomnia.        Sad facial looks-better/brighter     Past Medical History  Diagnosis Date  . Hypercholesterolemia   . Fatigue   . Basal cell carcinoma, ear     post -- partial pinnectomy left side with primary closure    . History of kidney stones     x1  . Acute blood loss anemia 06/30/2013  . Adjustment disorder with mixed anxiety and depressed mood 07/04/2013  . CAD (coronary artery disease) 07/04/2011  S/p CABG   . CVA, old, cognitive deficits 12/05/2013  . Dizziness and giddiness 10/07/2013  . Gait difficulty 02/17/2014  . HTN (hypertension) 07/04/2011  . Hyperlipidemia 07/04/2011  . Lack of coordination 11/08/2013  . Memory deficit 07/29/2013  . Numbness in both legs 10/31/2013  . Osteoarthritis of left knee 06/27/2013  . PAD (peripheral artery disease) 07/29/2013  . S/P knee replacement 07/01/2013  . Situational depression 10/17/2013  . Unspecified constipation 07/01/2013  . Unspecified  hypothyroidism 07/01/2013     TSH 1.761 12/01/13     . Weakness 12/01/2013   Past Surgical History  Procedure Laterality Date  . Coronary artery bypass graft  2003    x4 --   . Cholecystectomy  2002  . Inguinal hernia repair  09/19/2007  . Ear cyst excision  12/14/2009    Excision of left auricular cyst with primary closure  . Basal cell carcinoma excision  01/14/2010    Wedge resection, partial pinnectomy left side with primary closure      . Tonsillectomy    . Carpal tunnel release    . Cataract surgery Bilateral 06-24-13  . Tonsillectomy    . Cystoscopy    . Total knee arthroplasty Left 06/27/2013    Procedure: LEFT TOTAL KNEE ARTHROPLASTY;  Surgeon: Tobi Bastos, MD;  Location: [facility];  Service: Orthopedics;  Laterality: Left;   Social History:   reports that he quit smoking about 32 years ago. He does not have any smokeless tobacco history on file. He reports that he does not drink alcohol or use illicit drugs.  Family History  Problem Relation Age of Onset  . Parkinsonism Father   . Angina Mother   . Coronary artery disease Brother     with CABG    Medications: Patient's Medications  New Prescriptions   No medications on file  Previous Medications   ASPIRIN 325 MG TABLET    Take 325 mg by mouth 2 (two) times daily.   BISACODYL (DULCOLAX) 10 MG SUPPOSITORY    Place 1 suppository (10 mg total) rectally daily as needed.   DOCUSATE SODIUM (COLACE) 100 MG CAPSULE    Take 100 mg by mouth 2 (two) times daily.   LEVOTHYROXINE (SYNTHROID, LEVOTHROID) 50 MCG TABLET    Take 50 mcg by mouth daily before breakfast.    LORAZEPAM (ATIVAN) 0.5 MG TABLET    0.5 mg every 4 (four) hours as needed.    MEMANTINE HCL ER (NAMENDA XR) 28 MG CP24    Take by mouth.   OXYCODONE (OXY IR/ROXICODONE) 5 MG IMMEDIATE RELEASE TABLET    Take 1-2 tablets (5-10 mg total) by mouth every 3 (three) hours as needed.   POLYETHYLENE GLYCOL (MIRALAX / GLYCOLAX) PACKET    Take 17 g by mouth daily as needed.    SENNA (SENOKOT) 8.6 MG TABLET    Take 2 tablets by mouth daily.    SERTRALINE (ZOLOFT) 25 MG TABLET    Take 75 mg by mouth daily.   TAMSULOSIN (FLOMAX) 0.4 MG CAPS CAPSULE    Take 1 capsule (0.4 mg total) by mouth daily.  Modified Medications   No medications on file  Discontinued Medications   No medications on file     Physical Exam: Physical Exam  Constitutional: He is oriented to person, place, and time. He appears well-developed and well-nourished. No distress.  generalized weakness. No longer transferring self and ambulates with walker.   HENT:  Head: Normocephalic and atraumatic.  Right Ear: External ear  normal.  Left Ear: External ear normal.  Nose: Nose normal.  Mouth/Throat: Oropharynx is clear and moist. No oropharyngeal exudate.  Eyes: Conjunctivae and EOM are normal. Pupils are equal, round, and reactive to light. Right eye exhibits no discharge. Left eye exhibits no discharge. No scleral icterus.  Neck: Normal range of motion. Neck supple. No JVD present. No tracheal deviation present. No thyromegaly present.  Cardiovascular: Normal rate, regular rhythm, normal heart sounds and intact distal pulses.   No murmur heard. Pulmonary/Chest: Effort normal. No stridor. No respiratory distress. He has no wheezes. He has rales. He exhibits no tenderness.  Dry rales bases.   Abdominal: Soft. Bowel sounds are normal. He exhibits no distension. There is no tenderness. There is no rebound and no guarding.  Genitourinary: Penis normal.  Musculoskeletal: Normal range of motion. He exhibits edema and tenderness.   Left knee mild swelling and warmth with limited flexion and extension ROM.   Lymphadenopathy:    He has no cervical adenopathy.  Neurological: He is alert and oriented to person, place, and time. He has normal reflexes. No cranial nerve deficit. He exhibits normal muscle tone. Coordination abnormal.  Decreased coordination of the RUE-improving.   Skin: Skin is warm and dry.  No rash noted. He is not diaphoretic. There is erythema. No pallor.  The left knee surgical incision is healed. Mild fever and erythema still presents. Thick, yellow, brittle left great toe nail.    Psychiatric: His speech is normal. Judgment and thought content normal. His mood appears anxious. He is slowed and withdrawn. Cognition and memory are impaired. He exhibits abnormal recent memory.  Flat affect-improved.     Filed Vitals:   05/12/14 1425  BP: 115/70  Pulse: 71  Temp: 97.9 F (36.6 C)  TempSrc: Tympanic  Resp: 20      Labs reviewed: Basic Metabolic Panel:  Recent Labs  06/24/13 1430 06/28/13 0541 06/29/13 0540  07/07/13 10/13/13 10/29/13 12/01/13  NA 136 136 135  < > 138 142 138 141  K 4.9 4.1 3.7  --  3.8 4.4 3.9 4.3  CL 101 103 102  --   --   --   --   --   CO2 26 26 24   --   --   --   --   --   GLUCOSE 97 115* 121*  --   --   --   --   --   BUN 31* 19 22  < > 20 26* 19 29*  CREATININE 0.98 0.83 0.81  < > 0.7 0.8 0.7 0.7  CALCIUM 9.7 8.6 8.5  --   --   --   --   --   TSH  --   --   --   --  2.35  --   --   --   < > = values in this interval not displayed. Liver Function Tests:  Recent Labs  05/14/13 0927 06/24/13 1430 07/07/13 10/29/13 12/01/13  AST 33 30 19 14  13*  ALT 40 22 19 9* 9*  ALKPHOS 71 87 114 98 149*  BILITOT 1.1 0.6  --   --   --   PROT 6.3 6.7  --   --   --   ALBUMIN 3.9 3.9  --   --   --    CBC:  Recent Labs  06/29/13 0540 06/30/13 0425 06/30/13 1515 07/01/13 0428  10/13/13 10/29/13 12/01/13  WBC 12.9* 9.7  --  9.3  < >  8.0 11.7 10.8  NEUTROABS  --   --   --  7.0  --   --   --   --   HGB 9.6* 8.3* 8.6* 8.1*  < > 14.6 12.8* 11.5*  HCT 28.2* 24.9* 24.9* 23.5*  < > 43 38* 34*  MCV 93.1 92.6  --  92.5  --   --   --   --   PLT 113* 92*  --  117*  < > 192 199 327  < > = values in this interval not displayed. Lipid Panel: No results found for this basename: CHOL, HDL, LDLCALC, TRIG, CHOLHDL, LDLDIRECT,  in the last 8760  hours  Past Procedures:  10/28/13 CXR minimal cardiomegaly wihtout pulmonary vascular congestion, no pleural effusion, patchy bibasilar pneumonitis.   12/02/13 CT with and without contrast: atrophy and chronic left external capsule infarct. No acute abnormality.   Assessment/Plan Onychomycosis of left great toe Observe.   Unspecified hypothyroidism Takes Levothyroxine 44mcg, TSH 2.35 07/07/13 and 1.761 12/01/13  Situational depression Hx of anxiety and Ativan use. 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 Namenda started and tolerated.      BPH (benign prostatic hyperplasia) Takes Tamsulosin. No urinary retention.      Constipation Stable with Senna II qd, MiraLax dailly, Colace 100mg  bid,  and prn Bisacodyl suppository prn      Family/ Staff Communication: observe the patient. PT to evaluate and treat   Goals of Care: SNF  Labs/tests ordered: none

## 2014-05-12 NOTE — Assessment & Plan Note (Signed)
Observe

## 2014-05-12 NOTE — Assessment & Plan Note (Signed)
Stable with Senna II qd, MiraLax dailly, Colace 100mg bid,  and prn Bisacodyl suppository prn  

## 2014-05-12 NOTE — Assessment & Plan Note (Signed)
Takes Tamsulosin. No urinary retention.    

## 2014-05-12 NOTE — Assessment & Plan Note (Signed)
Takes Levothyroxine 26mcg, TSH 2.35 07/07/13 and 1.761 12/01/13

## 2014-05-12 NOTE — Assessment & Plan Note (Signed)
Hx of anxiety and Ativan use. Better with Sertraline 75mg   and Depakote 250mg  daily

## 2014-05-12 NOTE — Assessment & Plan Note (Signed)
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 Namenda started and tolerated.   

## 2014-05-29 ENCOUNTER — Encounter: Payer: Self-pay | Admitting: Nurse Practitioner

## 2014-05-29 ENCOUNTER — Non-Acute Institutional Stay (SKILLED_NURSING_FACILITY): Payer: Medicare Other | Admitting: Nurse Practitioner

## 2014-05-29 DIAGNOSIS — K5900 Constipation, unspecified: Secondary | ICD-10-CM

## 2014-05-29 DIAGNOSIS — F4321 Adjustment disorder with depressed mood: Secondary | ICD-10-CM

## 2014-05-29 DIAGNOSIS — R413 Other amnesia: Secondary | ICD-10-CM

## 2014-05-29 DIAGNOSIS — R062 Wheezing: Secondary | ICD-10-CM

## 2014-05-29 DIAGNOSIS — M1712 Unilateral primary osteoarthritis, left knee: Secondary | ICD-10-CM

## 2014-05-29 DIAGNOSIS — R609 Edema, unspecified: Secondary | ICD-10-CM

## 2014-05-29 DIAGNOSIS — N400 Enlarged prostate without lower urinary tract symptoms: Secondary | ICD-10-CM

## 2014-05-29 DIAGNOSIS — E039 Hypothyroidism, unspecified: Secondary | ICD-10-CM

## 2014-05-29 DIAGNOSIS — M1710 Unilateral primary osteoarthritis, unspecified knee: Secondary | ICD-10-CM

## 2014-05-29 NOTE — Assessment & Plan Note (Signed)
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 Namenda started and tolerated.   

## 2014-05-29 NOTE — Assessment & Plan Note (Signed)
04/20/14 Ortho: needs 5Ibs on ankle and let his leg extend with the weight  S/p total left joint replacement surgery.  Contracted L knee 05/29/14 c/o both knee pain with weight bearing-X-ray bil hips and knees to evaluate further.

## 2014-05-29 NOTE — Assessment & Plan Note (Signed)
Stable with Senna II qd, MiraLax dailly, Colace 100mg bid,  and prn Bisacodyl suppository prn  

## 2014-05-29 NOTE — Assessment & Plan Note (Signed)
Takes Tamsulosin. No urinary retention.    

## 2014-05-29 NOTE — Assessment & Plan Note (Signed)
Takes Levothyroxine 80mcg, TSH 2.35 07/07/13 and 1.761 12/01/13. Update TSH

## 2014-05-29 NOTE — Assessment & Plan Note (Signed)
Hx of anxiety and Ativan use. Better with Sertraline 75mg   and Depakote 250mg  daily

## 2014-05-29 NOTE — Progress Notes (Signed)
Patient ID: William Roach, male   DOB: 04/16/1921, 80 y.o.   MRN: [id number]   Code Status: DNR  No Known Allergies  Chief Complaint  Patient presents with  . Medical Management of Chronic Issues  . Acute Visit    wheezing, left leg pain.     HPI: Patient is a 80 y.o. male seen in the SNF at [facility] today for evaluation of c/o expiratory wheezing and heavy breathing prior to geeing him OOB, c/o R+L knee pain,  and chronic medical conditions.  Problem List Items Addressed This Visit   BPH (benign prostatic hyperplasia)     Takes Tamsulosin. No urinary retention.       Edema - Primary     BLE 1+--will add Furosemide 20mg  + Kcl 39meq po daily in setting of wheezing/moist rales lower lungs. F/u CMP in one week.     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 Namenda started and tolerated.      Osteoarthritis of left knee     04/20/14 Ortho: needs 5Ibs on ankle and let his leg extend with the weight  S/p total left joint replacement surgery.  Contracted L knee 05/29/14 c/o both knee pain with weight bearing-X-ray bil hips and knees to evaluate further.      Situational depression     Hx of anxiety and Ativan use. Better with Sertraline 75mg   and Depakote 250mg  daily       Unspecified constipation     Stable with Senna II qd, MiraLax dailly, Colace 100mg  bid,  and prn Bisacodyl suppository prn      Unspecified hypothyroidism     Takes Levothyroxine 30mcg, TSH 2.35 07/07/13 and 1.761 12/01/13. Update TSH     Wheezes     Staff noted expiratory wheezes prior getting OOB-will obtain CXR. Symptomatic tx: DuoNeb q6hr x 3 days. Gentle diurese the patient in setting of new onset of wheezing and worsened BLE edema.        Review of Systems:  Review of Systems  Constitutional: Negative for fever, chills, weight loss and malaise/fatigue.       Generalized weakness-no longer ambulates with walker and difficulty transferring self    HENT: Negative for congestion, ear discharge, ear pain, hearing loss, nosebleeds, sore throat and tinnitus.   Eyes: Negative for blurred vision, double vision, photophobia, pain, discharge and redness.  Respiratory: Positive for wheezing. Negative for cough, hemoptysis, sputum production, shortness of breath and stridor.        Prior to getting OOB  Cardiovascular: Positive for leg swelling. Negative for chest pain, palpitations, orthopnea, claudication and PND.       BLE-1+  Gastrointestinal: Negative for heartburn, nausea, vomiting, abdominal pain, diarrhea, constipation, blood in stool and melena.  Genitourinary: Negative for dysuria, urgency, frequency, hematuria and flank pain.  Musculoskeletal: Positive for falls and joint pain. Negative for back pain, myalgias and neck pain.       Left knee and right knee. [place] 03/06/14 In his room when transferred himself from w/c to bed w/o apparent injury noted.   Skin: Negative for itching and rash.       The left knee surgical incision is healed. Mild fever and erythema still presents. Thick, yellow, brittle left great toe nail.   Neurological: Positive for sensory change and weakness. Negative for dizziness, tingling, tremors, speech change, focal weakness, seizures, loss of consciousness and headaches.       Numbness in BLE/feet in  am-better once he is up. Difficulty ambulates with walker independently  Endo/Heme/Allergies: Negative for environmental allergies and polydipsia. Does not bruise/bleed easily.  Psychiatric/Behavioral: Positive for depression and memory loss. Negative for suicidal ideas, hallucinations and substance abuse. The patient is nervous/anxious. The patient does not have insomnia.        Sad facial looks-better/brighter. Sleeps well at night      Past Medical History  Diagnosis Date  . Hypercholesterolemia   . Fatigue   . Basal cell carcinoma, ear     post -- partial pinnectomy left side with primary closure    . History of  kidney stones     x1  . Acute blood loss anemia 06/30/2013  . Adjustment disorder with mixed anxiety and depressed mood 07/04/2013  . CAD (coronary artery disease) 07/04/2011    S/p CABG   . CVA, old, cognitive deficits 12/05/2013  . Dizziness and giddiness 10/07/2013  . Gait difficulty 02/17/2014  . HTN (hypertension) 07/04/2011  . Hyperlipidemia 07/04/2011  . Lack of coordination 11/08/2013  . Memory deficit 07/29/2013  . Numbness in both legs 10/31/2013  . Osteoarthritis of left knee 06/27/2013  . PAD (peripheral artery disease) 07/29/2013  . S/P knee replacement 07/01/2013  . Situational depression 10/17/2013  . Unspecified constipation 07/01/2013  . Unspecified hypothyroidism 07/01/2013     TSH 1.761 12/01/13     . Weakness 12/01/2013   Past Surgical History  Procedure Laterality Date  . Coronary artery bypass graft  2003    x4 --   . Cholecystectomy  2002  . Inguinal hernia repair  09/19/2007  . Ear cyst excision  12/14/2009    Excision of left auricular cyst with primary closure  . Basal cell carcinoma excision  01/14/2010    Wedge resection, partial pinnectomy left side with primary closure      . Tonsillectomy    . Carpal tunnel release    . Cataract surgery Bilateral 06-24-13  . Tonsillectomy    . Cystoscopy    . Total knee arthroplasty Left 06/27/2013    Procedure: LEFT TOTAL KNEE ARTHROPLASTY;  Surgeon: Tobi Bastos, MD;  Location: [facility];  Service: Orthopedics;  Laterality: Left;   Social History:   reports that he quit smoking about 32 years ago. He does not have any smokeless tobacco history on file. He reports that he does not drink alcohol or use illicit drugs.  Family History  Problem Relation Age of Onset  . Parkinsonism Father   . Angina Mother   . Coronary artery disease Brother     with CABG    Medications: Patient's Medications  New Prescriptions   No medications on file  Previous Medications   ASPIRIN 325 MG TABLET    Take 325 mg by mouth 2 (two) times  daily.   BISACODYL (DULCOLAX) 10 MG SUPPOSITORY    Place 1 suppository (10 mg total) rectally daily as needed.   DOCUSATE SODIUM (COLACE) 100 MG CAPSULE    Take 100 mg by mouth 2 (two) times daily.   LEVOTHYROXINE (SYNTHROID, LEVOTHROID) 50 MCG TABLET    Take 50 mcg by mouth daily before breakfast.    LORAZEPAM (ATIVAN) 0.5 MG TABLET    0.5 mg every 4 (four) hours as needed.    MEMANTINE HCL ER (NAMENDA XR) 28 MG CP24    Take by mouth.   OXYCODONE (OXY IR/ROXICODONE) 5 MG IMMEDIATE RELEASE TABLET    Take 1-2 tablets (5-10 mg total) by mouth every 3 (three) hours  as needed.   POLYETHYLENE GLYCOL (MIRALAX / GLYCOLAX) PACKET    Take 17 g by mouth daily as needed.   SENNA (SENOKOT) 8.6 MG TABLET    Take 2 tablets by mouth daily.    SERTRALINE (ZOLOFT) 25 MG TABLET    Take 75 mg by mouth daily.   TAMSULOSIN (FLOMAX) 0.4 MG CAPS CAPSULE    Take 1 capsule (0.4 mg total) by mouth daily.  Modified Medications   No medications on file  Discontinued Medications   No medications on file     Physical Exam: Physical Exam  Constitutional: He is oriented to person, place, and time. He appears well-developed and well-nourished. No distress.  generalized weakness. No longer transferring self and ambulates with walker.   HENT:  Head: Normocephalic and atraumatic.  Right Ear: External ear normal.  Left Ear: External ear normal.  Nose: Nose normal.  Mouth/Throat: Oropharynx is clear and moist. No oropharyngeal exudate.  Eyes: Conjunctivae and EOM are normal. Pupils are equal, round, and reactive to light. Right eye exhibits no discharge. Left eye exhibits no discharge. No scleral icterus.  Neck: Normal range of motion. Neck supple. No JVD present. No tracheal deviation present. No thyromegaly present.  Cardiovascular: Normal rate, regular rhythm, normal heart sounds and intact distal pulses.   No murmur heard. Pulmonary/Chest: Effort normal. No stridor. No respiratory distress. He has wheezes. He has  rales. He exhibits no tenderness.  moist rales posterior lower lungs. Wheezes prior to getting OOB  Abdominal: Soft. Bowel sounds are normal. He exhibits no distension. There is no tenderness. There is no rebound and no guarding.  Genitourinary: Penis normal.  Musculoskeletal: Normal range of motion. He exhibits edema and tenderness.   Left knee mild swelling and warmth with limited flexion and extension ROM. BLE edema 1+. C/o R+L knee pain.   Lymphadenopathy:    He has no cervical adenopathy.  Neurological: He is alert and oriented to person, place, and time. He has normal reflexes. No cranial nerve deficit. He exhibits normal muscle tone. Coordination abnormal.  Decreased coordination of the RUE-improving.   Skin: Skin is warm and dry. No rash noted. He is not diaphoretic. There is erythema. No pallor.  The left knee surgical incision is healed. Mild fever and erythema still presents. Thick, yellow, brittle left great toe nail.    Psychiatric: His speech is normal. Judgment and thought content normal. His mood appears anxious. He is slowed and withdrawn. Cognition and memory are impaired. He exhibits abnormal recent memory.  Flat affect-improved.     Filed Vitals:   05/29/14 1129  BP: 110/70  Pulse: 76  Temp: 97 F (36.1 C)  TempSrc: Tympanic  Resp: 20      Labs reviewed: Basic Metabolic Panel:  Recent Labs  06/24/13 1430 06/28/13 0541 06/29/13 0540  07/07/13 10/13/13 10/29/13 12/01/13  NA 136 136 135  < > 138 142 138 141  K 4.9 4.1 3.7  --  3.8 4.4 3.9 4.3  CL 101 103 102  --   --   --   --   --   CO2 26 26 24   --   --   --   --   --   GLUCOSE 97 115* 121*  --   --   --   --   --   BUN 31* 19 22  < > 20 26* 19 29*  CREATININE 0.98 0.83 0.81  < > 0.7 0.8 0.7 0.7  CALCIUM 9.7 8.6 8.5  --   --   --   --   --  TSH  --   --   --   --  2.35  --   --   --   < > = values in this interval not displayed. Liver Function Tests:  Recent Labs  06/24/13 1430 07/07/13 10/29/13  12/01/13  AST 30 19 14  13*  ALT 22 19 9* 9*  ALKPHOS 87 114 98 149*  BILITOT 0.6  --   --   --   PROT 6.7  --   --   --   ALBUMIN 3.9  --   --   --    CBC:  Recent Labs  06/29/13 0540 06/30/13 0425 06/30/13 1515 07/01/13 0428  10/13/13 10/29/13 12/01/13  WBC 12.9* 9.7  --  9.3  < > 8.0 11.7 10.8  NEUTROABS  --   --   --  7.0  --   --   --   --   HGB 9.6* 8.3* 8.6* 8.1*  < > 14.6 12.8* 11.5*  HCT 28.2* 24.9* 24.9* 23.5*  < > 43 38* 34*  MCV 93.1 92.6  --  92.5  --   --   --   --   PLT 113* 92*  --  117*  < > 192 199 327  < > = values in this interval not displayed. Lipid Panel: No results found for this basename: CHOL, HDL, LDLCALC, TRIG, CHOLHDL, LDLDIRECT,  in the last 8760 hours  Past Procedures:  10/28/13 CXR minimal cardiomegaly wihtout pulmonary vascular congestion, no pleural effusion, patchy bibasilar pneumonitis.   12/02/13 CT with and without contrast: atrophy and chronic left external capsule infarct. No acute abnormality.   Assessment/Plan Edema BLE 1+--will add Furosemide 20mg  + Kcl 67meq po daily in setting of wheezing/moist rales lower lungs. F/u CMP in one week.   Wheezes Staff noted expiratory wheezes prior getting OOB-will obtain CXR. Symptomatic tx: DuoNeb q6hr x 3 days. Gentle diurese the patient in setting of new onset of wheezing and worsened BLE edema.   Osteoarthritis of left knee 04/20/14 Ortho: needs 5Ibs on ankle and let his leg extend with the weight  S/p total left joint replacement surgery.  Contracted L knee 05/29/14 c/o both knee pain with weight bearing-X-ray bil hips and knees to evaluate further.    Unspecified hypothyroidism Takes Levothyroxine 9mcg, TSH 2.35 07/07/13 and 1.761 12/01/13. Update TSH   Unspecified constipation Stable with Senna II qd, MiraLax dailly, Colace 100mg  bid,  and prn Bisacodyl suppository prn    Situational depression Hx of anxiety and Ativan use. 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 Namenda started and tolerated.    BPH (benign prostatic hyperplasia) Takes Tamsulosin. No urinary retention.       Family/ Staff Communication: observe the patient. PT to evaluate and treat   Goals of Care: SNF  Labs/tests ordered: CXR, X-ray Bil hips/knees. CMP one week. TSH

## 2014-05-29 NOTE — Assessment & Plan Note (Signed)
Staff noted expiratory wheezes prior getting OOB-will obtain CXR. Symptomatic tx: DuoNeb q6hr x 3 days. Gentle diurese the patient in setting of new onset of wheezing and worsened BLE edema.

## 2014-05-29 NOTE — Assessment & Plan Note (Signed)
BLE 1+--will add Furosemide 20mg  + Kcl 48meq po daily in setting of wheezing/moist rales lower lungs. F/u CMP in one week.

## 2014-06-02 ENCOUNTER — Encounter: Payer: Self-pay | Admitting: Nurse Practitioner

## 2014-06-04 LAB — BASIC METABOLIC PANEL
BUN: 27 mg/dL — AB (ref 4–21)
Creatinine: 0.8 mg/dL (ref 0.6–1.3)
Glucose: 79 mg/dL
Potassium: 4.1 mmol/L (ref 3.4–5.3)
Sodium: 140 mmol/L (ref 137–147)

## 2014-06-04 LAB — TSH: TSH: 5.53 u[IU]/mL (ref 0.41–5.90)

## 2014-06-05 ENCOUNTER — Encounter: Payer: Self-pay | Admitting: Nurse Practitioner

## 2014-06-05 ENCOUNTER — Non-Acute Institutional Stay (SKILLED_NURSING_FACILITY): Payer: Medicare Other | Admitting: Nurse Practitioner

## 2014-06-05 DIAGNOSIS — M1712 Unilateral primary osteoarthritis, left knee: Secondary | ICD-10-CM

## 2014-06-05 DIAGNOSIS — K5900 Constipation, unspecified: Secondary | ICD-10-CM

## 2014-06-05 DIAGNOSIS — R609 Edema, unspecified: Secondary | ICD-10-CM

## 2014-06-05 DIAGNOSIS — R062 Wheezing: Secondary | ICD-10-CM

## 2014-06-05 DIAGNOSIS — F4321 Adjustment disorder with depressed mood: Secondary | ICD-10-CM

## 2014-06-05 DIAGNOSIS — R413 Other amnesia: Secondary | ICD-10-CM

## 2014-06-05 DIAGNOSIS — N400 Enlarged prostate without lower urinary tract symptoms: Secondary | ICD-10-CM

## 2014-06-05 DIAGNOSIS — E039 Hypothyroidism, unspecified: Secondary | ICD-10-CM

## 2014-06-05 DIAGNOSIS — M1710 Unilateral primary osteoarthritis, unspecified knee: Secondary | ICD-10-CM

## 2014-06-05 NOTE — Assessment & Plan Note (Signed)
Takes Levothyroxine 35mcg, TSH 2.35 07/07/13 and 1.761 12/01/13. 06/04/14 TSH 5.533-up Levothyroxine to 65mcg-f/u TSH 8 weeks.

## 2014-06-05 NOTE — Progress Notes (Signed)
Patient ID: William Roach, male   DOB: [birth year], 80 y.o.   MRN: [id number]   Code Status: DNR  No Known Allergies  Chief Complaint  Patient presents with  . Medical Management of Chronic Issues  . Acute Visit    thyroid    HPI: Patient is a 80 y.o. male seen in the SNF at [facility] today for evaluation of thyroid and chronic medical conditions.  Problem List Items Addressed This Visit   BPH (benign prostatic hyperplasia)     Takes Tamsulosin. No urinary retention.       Constipation     Stable with Senna II qd, MiraLax dailly, Colace 100mg  bid,  and prn Bisacodyl suppository prn      Edema     Trace edema RLE and 1+ LLE since Furosemide 20mg  + Kcl 29meq po daily in setting of wheezing/moist rales lower lungs. 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 Namenda started and tolerated.       Osteoarthritis of left knee     04/20/14 Ortho: needs 5Ibs on ankle and let his leg extend with the weight  S/p total left joint replacement surgery.  Contracted L knee 05/29/14 c/o both knee pain with weight bearing. 05/29/14 X-ray L knee/hip: no acute osseous abnormality, chronic and degenerative findings, s/p left total knee arthroplasty.       Situational depression     Hx of anxiety and Ativan use. Better with Sertraline 75mg   and Depakote 250mg  daily       Unspecified constipation     Stable with Senna II qd, MiraLax dailly, Colace 100mg  bid,  and prn Bisacodyl suppository prn      Unspecified hypothyroidism - Primary     Takes Levothyroxine 40mcg, TSH 2.35 07/07/13 and 1.761 12/01/13. 06/04/14 TSH 5.533-up Levothyroxine to 20mcg-f/u TSH 8 weeks.          Relevant Medications      levothyroxine (SYNTHROID, LEVOTHROID) 75 MCG tablet   Wheezes     Staff noted expiratory wheezes prior getting OOB- Completed symptomatic tx: DuoNeb q6hr x 3 days. Improved since gentle diurese the patient in setting of new onset of  wheezing and worsened BLE edema.  05/29/14 CXR minimal cardiomegaly and central pulmonary vascular congestion, without other overt congestive heart failure findings. Minimal left basilar subsegmental atelectatic change, but otherwise negative for suspected focal pneumonia.           Review of Systems:  Review of Systems  Constitutional: Negative for fever, chills, weight loss and malaise/fatigue.       Generalized weakness-no longer ambulates with walker and difficulty transferring self  HENT: Negative for congestion, ear discharge, ear pain, hearing loss, nosebleeds, sore throat and tinnitus.   Eyes: Negative for blurred vision, double vision, photophobia, pain, discharge and redness.  Respiratory: Negative for cough, hemoptysis, sputum production, shortness of breath, wheezing and stridor.        Prior to getting OOB-better  Cardiovascular: Positive for leg swelling. Negative for chest pain, palpitations, orthopnea, claudication and PND.       Trace RLE. 1+ LLE  Gastrointestinal: Negative for heartburn, nausea, vomiting, abdominal pain, diarrhea, constipation, blood in stool and melena.  Genitourinary: Negative for dysuria, urgency, frequency, hematuria and flank pain.  Musculoskeletal: Positive for falls and joint pain. Negative for back pain, myalgias and neck pain.       Left knee and right knee. [place] 03/06/14  In his room when transferred himself from w/c to bed w/o apparent injury noted.   Skin: Negative for itching and rash.       The left knee surgical incision is healed. Mild fever and erythema still presents. Thick, yellow, brittle left great toe nail.   Neurological: Positive for sensory change and weakness. Negative for dizziness, tingling, tremors, speech change, focal weakness, seizures, loss of consciousness and headaches.       Numbness in BLE/feet in am-better once he is up. Difficulty ambulates with walker independently  Endo/Heme/Allergies: Negative for environmental  allergies and polydipsia. Does not bruise/bleed easily.  Psychiatric/Behavioral: Positive for depression and memory loss. Negative for suicidal ideas, hallucinations and substance abuse. The patient is nervous/anxious. The patient does not have insomnia.        Sad facial looks-better/brighter. Sleeps well at night      Past Medical History  Diagnosis Date  . Hypercholesterolemia   . Fatigue   . Basal cell carcinoma, ear     post -- partial pinnectomy left side with primary closure    . History of kidney stones     x1  . Acute blood loss anemia 06/30/2013  . Adjustment disorder with mixed anxiety and depressed mood 07/04/2013  . CAD (coronary artery disease) 07/04/2011    S/p CABG   . CVA, old, cognitive deficits 12/05/2013  . Dizziness and giddiness 10/07/2013  . Gait difficulty 02/17/2014  . HTN (hypertension) 07/04/2011  . Hyperlipidemia 07/04/2011  . Lack of coordination 11/08/2013  . Memory deficit 07/29/2013  . Numbness in both legs 10/31/2013  . Osteoarthritis of left knee 06/27/2013  . PAD (peripheral artery disease) 07/29/2013  . S/P knee replacement 07/01/2013  . Situational depression 10/17/2013  . Unspecified constipation 07/01/2013  . Unspecified hypothyroidism 07/01/2013     TSH 1.761 12/01/13     . Weakness 12/01/2013   Past Surgical History  Procedure Laterality Date  . Coronary artery bypass graft  2003    x4 --   . Cholecystectomy  2002  . Inguinal hernia repair  09/19/2007  . Ear cyst excision  12/14/2009    Excision of left auricular cyst with primary closure  . Basal cell carcinoma excision  01/14/2010    Wedge resection, partial pinnectomy left side with primary closure      . Tonsillectomy    . Carpal tunnel release    . Cataract surgery Bilateral 06-24-13  . Tonsillectomy    . Cystoscopy    . Total knee arthroplasty Left 06/27/2013    Procedure: LEFT TOTAL KNEE ARTHROPLASTY;  Surgeon: Tobi Bastos, MD;  Location: [facility];  Service: Orthopedics;  Laterality: Left;    Social History:   reports that he quit smoking about 33 years ago. He does not have any smokeless tobacco history on file. He reports that he does not drink alcohol or use illicit drugs.  Family History  Problem Relation Age of Onset  . Parkinsonism Father   . Angina Mother   . Coronary artery disease Brother     with CABG    Medications: Patient's Medications  New Prescriptions   No medications on file  Previous Medications   ASPIRIN 325 MG TABLET    Take 325 mg by mouth 2 (two) times daily.   BISACODYL (DULCOLAX) 10 MG SUPPOSITORY    Place 1 suppository (10 mg total) rectally daily as needed.   DOCUSATE SODIUM (COLACE) 100 MG CAPSULE    Take 100 mg by mouth 2 (two)  times daily.   LEVOTHYROXINE (SYNTHROID, LEVOTHROID) 75 MCG TABLET    Take 75 mcg by mouth daily before breakfast.   LORAZEPAM (ATIVAN) 0.5 MG TABLET    0.5 mg every 4 (four) hours as needed.    MEMANTINE HCL ER (NAMENDA XR) 28 MG CP24    Take by mouth.   OXYCODONE (OXY IR/ROXICODONE) 5 MG IMMEDIATE RELEASE TABLET    Take 1-2 tablets (5-10 mg total) by mouth every 3 (three) hours as needed.   POLYETHYLENE GLYCOL (MIRALAX / GLYCOLAX) PACKET    Take 17 g by mouth daily as needed.   SENNA (SENOKOT) 8.6 MG TABLET    Take 2 tablets by mouth daily.    SERTRALINE (ZOLOFT) 25 MG TABLET    Take 75 mg by mouth daily.   TAMSULOSIN (FLOMAX) 0.4 MG CAPS CAPSULE    Take 1 capsule (0.4 mg total) by mouth daily.  Modified Medications   No medications on file  Discontinued Medications   LEVOTHYROXINE (SYNTHROID, LEVOTHROID) 50 MCG TABLET    Take 50 mcg by mouth daily before breakfast.      Physical Exam: Physical Exam  Constitutional: He is oriented to person, place, and time. He appears well-developed and well-nourished. No distress.  generalized weakness. No longer transferring self and ambulates with walker.   HENT:  Head: Normocephalic and atraumatic.  Right Ear: External ear normal.  Left Ear: External ear normal.    Nose: Nose normal.  Mouth/Throat: Oropharynx is clear and moist. No oropharyngeal exudate.  Eyes: Conjunctivae and EOM are normal. Pupils are equal, round, and reactive to light. Right eye exhibits no discharge. Left eye exhibits no discharge. No scleral icterus.  Neck: Normal range of motion. Neck supple. No JVD present. No tracheal deviation present. No thyromegaly present.  Cardiovascular: Normal rate, regular rhythm, normal heart sounds and intact distal pulses.   No murmur heard. Pulmonary/Chest: Effort normal. No stridor. No respiratory distress. He has no wheezes. He has rales. He exhibits no tenderness.  moist rales posterior lower lungs and wheezes prior to getting OOB-improved.   Abdominal: Soft. Bowel sounds are normal. He exhibits no distension. There is no tenderness. There is no rebound and no guarding.  Genitourinary: Penis normal.  Musculoskeletal: Normal range of motion. He exhibits edema and tenderness.   Left knee mild swelling and warmth with limited flexion and extension ROM. LLE edema 1+ and trace edema RLE. C/o R+L knee pain.   Lymphadenopathy:    He has no cervical adenopathy.  Neurological: He is alert and oriented to person, place, and time. He has normal reflexes. No cranial nerve deficit. He exhibits normal muscle tone. Coordination abnormal.  Decreased coordination of the RUE-improving.   Skin: Skin is warm and dry. No rash noted. He is not diaphoretic. There is erythema. No pallor.  The left knee surgical incision is healed. Mild fever and erythema still presents. Thick, yellow, brittle left great toe nail.    Psychiatric: His speech is normal. Judgment and thought content normal. His mood appears anxious. He is slowed and withdrawn. Cognition and memory are impaired. He exhibits abnormal recent memory.  Flat affect-improved.     Filed Vitals:   06/05/14 1413  BP: 142/71  Pulse: 81  Temp: 98.4 F (36.9 C)  TempSrc: Tympanic  Resp: 20      Labs  reviewed: Basic Metabolic Panel:  Recent Labs  06/24/13 1430 06/28/13 0541 06/29/13 0540 07/07/13  10/29/13 12/01/13 06/04/14  NA 136 136 135 138  < >  138 141 140  K 4.9 4.1 3.7 3.8  < > 3.9 4.3 4.1  CL 101 103 102  --   --   --   --   --   CO2 26 26 24   --   --   --   --   --   GLUCOSE 97 115* 121*  --   --   --   --   --   BUN 31* 19 22 20   < > 19 29* 27*  CREATININE 0.98 0.83 0.81 0.7  < > 0.7 0.7 0.8  CALCIUM 9.7 8.6 8.5  --   --   --   --   --   TSH  --   --   --  2.35  --   --   --  5.53  < > = values in this interval not displayed. Liver Function Tests:  Recent Labs  06/24/13 1430 07/07/13 10/29/13 12/01/13  AST 30 19 14  13*  ALT 22 19 9* 9*  ALKPHOS 87 114 98 149*  BILITOT 0.6  --   --   --   PROT 6.7  --   --   --   ALBUMIN 3.9  --   --   --    CBC:  Recent Labs  06/29/13 0540 06/30/13 0425 06/30/13 1515 07/01/13 0428  10/13/13 10/29/13 12/01/13  WBC 12.9* 9.7  --  9.3  < > 8.0 11.7 10.8  NEUTROABS  --   --   --  7.0  --   --   --   --   HGB 9.6* 8.3* 8.6* 8.1*  < > 14.6 12.8* 11.5*  HCT 28.2* 24.9* 24.9* 23.5*  < > 43 38* 34*  MCV 93.1 92.6  --  92.5  --   --   --   --   PLT 113* 92*  --  117*  < > 192 199 327  < > = values in this interval not displayed. Lipid Panel: No results found for this basename: CHOL, HDL, LDLCALC, TRIG, CHOLHDL, LDLDIRECT,  in the last 8760 hours  Past Procedures:  10/28/13 CXR minimal cardiomegaly wihtout pulmonary vascular congestion, no pleural effusion, patchy bibasilar pneumonitis.   12/02/13 CT with and without contrast: atrophy and chronic left external capsule infarct. No acute abnormality.   Assessment/Plan Unspecified hypothyroidism Takes Levothyroxine 28mcg, TSH 2.35 07/07/13 and 1.761 12/01/13. 06/04/14 TSH 5.533-up Levothyroxine to 60mcg-f/u TSH 8 weeks.        Wheezes Staff noted expiratory wheezes prior getting OOB- Completed symptomatic tx: DuoNeb q6hr x 3 days. Improved since gentle diurese the patient in  setting of new onset of wheezing and worsened BLE edema.  05/29/14 CXR minimal cardiomegaly and central pulmonary vascular congestion, without other overt congestive heart failure findings. Minimal left basilar subsegmental atelectatic change, but otherwise negative for suspected focal pneumonia.      Unspecified constipation Stable with Senna II qd, MiraLax dailly, Colace 100mg  bid,  and prn Bisacodyl suppository prn    Situational depression Hx of anxiety and Ativan use. Better with Sertraline 75mg   and Depakote 250mg  daily     Osteoarthritis of left knee 04/20/14 Ortho: needs 5Ibs on ankle and let his leg extend with the weight  S/p total left joint replacement surgery.  Contracted L knee 05/29/14 c/o both knee pain with weight bearing. 05/29/14 X-ray L knee/hip: no acute osseous abnormality, chronic and degenerative findings, s/p left total knee arthroplasty.     Memory deficit MMSE  48/30, CT identified old CVA-vascular dementia likely. Namenda started and tolerated.     Edema Trace edema RLE and 1+ LLE since Furosemide 20mg  + Kcl 91meq po daily in setting of wheezing/moist rales lower lungs. F/u CMP    BPH (benign prostatic hyperplasia) Takes Tamsulosin. No urinary retention.     Constipation Stable with Senna II qd, MiraLax dailly, Colace 100mg  bid,  and prn Bisacodyl suppository prn      Family/ Staff Communication: observe the patient. PT to evaluate and treat   Goals of Care: SNF  Labs/tests ordered: 06/05/14 TSH 8 wks.

## 2014-06-15 ENCOUNTER — Encounter: Payer: Self-pay | Admitting: Nurse Practitioner

## 2014-06-15 NOTE — Assessment & Plan Note (Signed)
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 Namenda started and tolerated.   

## 2014-06-15 NOTE — Assessment & Plan Note (Signed)
Hx of anxiety and Ativan use. Better with Sertraline 75mg   and Depakote 250mg  daily

## 2014-06-15 NOTE — Assessment & Plan Note (Signed)
04/20/14 Ortho: needs 5Ibs on ankle and let his leg extend with the weight  S/p total left joint replacement surgery.  Contracted L knee 05/29/14 c/o both knee pain with weight bearing. 05/29/14 X-ray L knee/hip: no acute osseous abnormality, chronic and degenerative findings, s/p left total knee arthroplasty.

## 2014-06-15 NOTE — Assessment & Plan Note (Signed)
Stable with Senna II qd, MiraLax dailly, Colace 100mg bid,  and prn Bisacodyl suppository prn  

## 2014-06-15 NOTE — Assessment & Plan Note (Signed)
Trace edema RLE and 1+ LLE since Furosemide 20mg  + Kcl 55meq po daily in setting of wheezing/moist rales lower lungs. F/u CMP

## 2014-06-15 NOTE — Assessment & Plan Note (Signed)
Staff noted expiratory wheezes prior getting OOB- Completed symptomatic tx: DuoNeb q6hr x 3 days. Improved since gentle diurese the patient in setting of new onset of wheezing and worsened BLE edema.  05/29/14 CXR minimal cardiomegaly and central pulmonary vascular congestion, without other overt congestive heart failure findings. Minimal left basilar subsegmental atelectatic change, but otherwise negative for suspected focal pneumonia.

## 2014-06-15 NOTE — Assessment & Plan Note (Signed)
Takes Tamsulosin. No urinary retention.    

## 2014-06-16 ENCOUNTER — Non-Acute Institutional Stay (SKILLED_NURSING_FACILITY): Payer: Medicare Other | Admitting: Nurse Practitioner

## 2014-06-16 ENCOUNTER — Encounter: Payer: Self-pay | Admitting: Nurse Practitioner

## 2014-06-16 DIAGNOSIS — E039 Hypothyroidism, unspecified: Secondary | ICD-10-CM

## 2014-06-16 DIAGNOSIS — B001 Herpesviral vesicular dermatitis: Secondary | ICD-10-CM

## 2014-06-16 DIAGNOSIS — R413 Other amnesia: Secondary | ICD-10-CM

## 2014-06-16 DIAGNOSIS — M1712 Unilateral primary osteoarthritis, left knee: Secondary | ICD-10-CM

## 2014-06-16 DIAGNOSIS — N400 Enlarged prostate without lower urinary tract symptoms: Secondary | ICD-10-CM

## 2014-06-16 DIAGNOSIS — R062 Wheezing: Secondary | ICD-10-CM

## 2014-06-16 DIAGNOSIS — R609 Edema, unspecified: Secondary | ICD-10-CM

## 2014-06-16 DIAGNOSIS — K5900 Constipation, unspecified: Secondary | ICD-10-CM

## 2014-06-16 DIAGNOSIS — M1710 Unilateral primary osteoarthritis, unspecified knee: Secondary | ICD-10-CM

## 2014-06-16 DIAGNOSIS — B009 Herpesviral infection, unspecified: Secondary | ICD-10-CM

## 2014-06-16 DIAGNOSIS — F4321 Adjustment disorder with depressed mood: Secondary | ICD-10-CM

## 2014-06-16 NOTE — Assessment & Plan Note (Signed)
Staff noted expiratory wheezes prior getting OOB-resolved, completed Neb x3 days, continued with Furosemide.  05/29/14 CXR minimal cardiomegaly and central pulmonary vascular congestion, without other overt congestive heart failure findings. Minimal left basilar subsegmental atelectatic change, but otherwise negative for suspected focal pneumonia.

## 2014-06-16 NOTE — Assessment & Plan Note (Addendum)
Trace edema RLE and 1+ LLE since Furosemide 20mg  + Kcl 25meq po daily in setting of wheezing/moist rales lower lungs. 06/04/14 Bun/creat 27/0.8

## 2014-06-16 NOTE — Assessment & Plan Note (Signed)
Takes Tamsulosin. No urinary retention.    

## 2014-06-16 NOTE — Assessment & Plan Note (Signed)
a blister @ the corner of the right lower lip. Apply Abreva topical 5x/day until healed. Max [phone].

## 2014-06-16 NOTE — Assessment & Plan Note (Signed)
04/20/14 Ortho: needs 5Ibs on ankle and let his leg extend with the weight  S/p total left joint replacement surgery.  Contracted L knee 05/29/14 c/o both knee pain with weight bearing. 05/29/14 X-ray L knee/hip: no acute osseous abnormality, chronic and degenerative findings, s/p left total knee arthroplasty.

## 2014-06-16 NOTE — Progress Notes (Signed)
Patient ID: William Roach, male   DOB: Apr 14, 1921, 80 y.o.   MRN: [id number]   Code Status: DNR  No Known Allergies  Chief Complaint  Patient presents with  . Medical Management of Chronic Issues  . Acute Visit    a blister @ corner of the right lower lip    HPI: Patient is a 80 y.o. male seen in the SNF at [facility] today for evaluation of a blister @ the corner of the right lower lip and chronic medical conditions.  Problem List Items Addressed This Visit   BPH (benign prostatic hyperplasia)     Takes Tamsulosin. No urinary retention.       Cold sore - Primary     a blister @ the corner of the right lower lip. Apply Abreva topical 5x/day until healed. Max [phone].     Constipation     Stable with Senna II qd, MiraLax dailly, Colace 100mg  bid,  and prn Bisacodyl suppository prn       Edema     Trace edema RLE and 1+ LLE since Furosemide 20mg  + Kcl 54meq po daily in setting of wheezing/moist rales lower lungs. 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 Namenda started and tolerated.        Osteoarthritis of left knee     04/20/14 Ortho: needs 5Ibs on ankle and let his leg extend with the weight  S/p total left joint replacement surgery.  Contracted L knee 05/29/14 c/o both knee pain with weight bearing. 05/29/14 X-ray L knee/hip: no acute osseous abnormality, chronic and degenerative findings, s/p left total knee arthroplasty.        Situational depression     06/16/14 dc Ativan prn not used within the previous 90 days.  Hx of anxiety and Ativan use. Better with Sertraline 75mg   and Depakote 250mg  daily      Unspecified constipation     Stable with Senna II qd, MiraLax dailly, Colace 100mg  bid,  and prn Bisacodyl suppository prn      Unspecified hypothyroidism     TSH 1.761 12/01/13 06/04/14 TSH 5.533-up Levothyroxine to 57mcg-f/u TSH 8 weeks.        Wheezes     Staff noted  expiratory wheezes prior getting OOB-resolved, completed Neb x3 days, continued with Furosemide.  05/29/14 CXR minimal cardiomegaly and central pulmonary vascular congestion, without other overt congestive heart failure findings. Minimal left basilar subsegmental atelectatic change, but otherwise negative for suspected focal pneumonia.         Review of Systems:  Review of Systems  Constitutional: Negative for fever, chills, weight loss and malaise/fatigue.       Generalized weakness-no longer ambulates with walker and difficulty transferring self  HENT: Negative for congestion, ear discharge, ear pain, hearing loss, nosebleeds, sore throat and tinnitus.   Eyes: Negative for blurred vision, double vision, photophobia, pain, discharge and redness.  Respiratory: Negative for cough, hemoptysis, sputum production, shortness of breath, wheezing and stridor.        Prior to getting OOB-better  Cardiovascular: Positive for leg swelling. Negative for chest pain, palpitations, orthopnea, claudication and PND.       Trace RLE. 1+ LLE  Gastrointestinal: Negative for heartburn, nausea, vomiting, abdominal pain, diarrhea, constipation, blood in stool and melena.  Genitourinary: Negative for dysuria, urgency, frequency, hematuria and flank pain.  Musculoskeletal: Positive for falls and joint pain. Negative for back pain,  myalgias and neck pain.       Left knee and right knee. [place] 03/06/14 In his room when transferred himself from w/c to bed w/o apparent injury noted.   Skin: Negative for itching and rash.       The left knee surgical incision is healed. Mild fever and erythema still presents. Thick, yellow, brittle left great toe nail. a blister @ the corner of the right lower lip   Neurological: Positive for sensory change and weakness. Negative for dizziness, tingling, tremors, speech change, focal weakness, seizures, loss of consciousness and headaches.       Numbness in BLE/feet in am-better once he is  up. Difficulty ambulates with walker independently  Endo/Heme/Allergies: Negative for environmental allergies and polydipsia. Does not bruise/bleed easily.  Psychiatric/Behavioral: Positive for depression and memory loss. Negative for suicidal ideas, hallucinations and substance abuse. The patient is nervous/anxious. The patient does not have insomnia.        Sad facial looks-better/brighter. Sleeps well at night      Past Medical History  Diagnosis Date  . Hypercholesterolemia   . Fatigue   . Basal cell carcinoma, ear     post -- partial pinnectomy left side with primary closure    . History of kidney stones     x1  . Acute blood loss anemia 06/30/2013  . Adjustment disorder with mixed anxiety and depressed mood 07/04/2013  . CAD (coronary artery disease) 07/04/2011    S/p CABG   . CVA, old, cognitive deficits 12/05/2013  . Dizziness and giddiness 10/07/2013  . Gait difficulty 02/17/2014  . HTN (hypertension) 07/04/2011  . Hyperlipidemia 07/04/2011  . Lack of coordination 11/08/2013  . Memory deficit 07/29/2013  . Numbness in both legs 10/31/2013  . Osteoarthritis of left knee 06/27/2013  . PAD (peripheral artery disease) 07/29/2013  . S/P knee replacement 07/01/2013  . Situational depression 10/17/2013  . Unspecified constipation 07/01/2013  . Unspecified hypothyroidism 07/01/2013     TSH 1.761 12/01/13     . Weakness 12/01/2013   Past Surgical History  Procedure Laterality Date  . Coronary artery bypass graft  2003    x4 --   . Cholecystectomy  2002  . Inguinal hernia repair  09/19/2007  . Ear cyst excision  12/14/2009    Excision of left auricular cyst with primary closure  . Basal cell carcinoma excision  01/14/2010    Wedge resection, partial pinnectomy left side with primary closure      . Tonsillectomy    . Carpal tunnel release    . Cataract surgery Bilateral 06-24-13  . Tonsillectomy    . Cystoscopy    . Total knee arthroplasty Left 06/27/2013    Procedure: LEFT TOTAL KNEE  ARTHROPLASTY;  Surgeon: Tobi Bastos, MD;  Location: [facility];  Service: Orthopedics;  Laterality: Left;   Social History:   reports that he quit smoking about 33 years ago. He does not have any smokeless tobacco history on file. He reports that he does not drink alcohol or use illicit drugs.  Family History  Problem Relation Age of Onset  . Parkinsonism Father   . Angina Mother   . Coronary artery disease Brother     with CABG    Medications: Patient's Medications  New Prescriptions   No medications on file  Previous Medications   ASPIRIN 325 MG TABLET    Take 325 mg by mouth 2 (two) times daily.   BISACODYL (DULCOLAX) 10 MG SUPPOSITORY    Place  1 suppository (10 mg total) rectally daily as needed.   DOCUSATE SODIUM (COLACE) 100 MG CAPSULE    Take 100 mg by mouth 2 (two) times daily.   LEVOTHYROXINE (SYNTHROID, LEVOTHROID) 75 MCG TABLET    Take 75 mcg by mouth daily before breakfast.   LORAZEPAM (ATIVAN) 0.5 MG TABLET    0.5 mg every 4 (four) hours as needed.    MEMANTINE HCL ER (NAMENDA XR) 28 MG CP24    Take by mouth.   OXYCODONE (OXY IR/ROXICODONE) 5 MG IMMEDIATE RELEASE TABLET    Take 1-2 tablets (5-10 mg total) by mouth every 3 (three) hours as needed.   POLYETHYLENE GLYCOL (MIRALAX / GLYCOLAX) PACKET    Take 17 g by mouth daily as needed.   SENNA (SENOKOT) 8.6 MG TABLET    Take 2 tablets by mouth daily.    SERTRALINE (ZOLOFT) 25 MG TABLET    Take 75 mg by mouth daily.   TAMSULOSIN (FLOMAX) 0.4 MG CAPS CAPSULE    Take 1 capsule (0.4 mg total) by mouth daily.  Modified Medications   No medications on file  Discontinued Medications   No medications on file     Physical Exam: Physical Exam  Constitutional: He is oriented to person, place, and time. He appears well-developed and well-nourished. No distress.  generalized weakness. No longer transferring self and ambulates with walker.   HENT:  Head: Normocephalic and atraumatic.  Right Ear: External ear normal.  Left  Ear: External ear normal.  Nose: Nose normal.  Mouth/Throat: Oropharynx is clear and moist. No oropharyngeal exudate.  Eyes: Conjunctivae and EOM are normal. Pupils are equal, round, and reactive to light. Right eye exhibits no discharge. Left eye exhibits no discharge. No scleral icterus.  Neck: Normal range of motion. Neck supple. No JVD present. No tracheal deviation present. No thyromegaly present.  Cardiovascular: Normal rate, regular rhythm, normal heart sounds and intact distal pulses.   No murmur heard. Pulmonary/Chest: Effort normal. No stridor. No respiratory distress. He has no wheezes. He has rales. He exhibits no tenderness.  moist rales posterior lower lungs and wheezes prior to getting OOB-improved.   Abdominal: Soft. Bowel sounds are normal. He exhibits no distension. There is no tenderness. There is no rebound and no guarding.  Genitourinary: Penis normal.  Musculoskeletal: Normal range of motion. He exhibits edema and tenderness.   Left knee mild swelling and warmth with limited flexion and extension ROM. LLE edema 1+ and trace edema RLE. C/o R+L knee pain.   Lymphadenopathy:    He has no cervical adenopathy.  Neurological: He is alert and oriented to person, place, and time. He has normal reflexes. No cranial nerve deficit. He exhibits normal muscle tone. Coordination abnormal.  Decreased coordination of the RUE-improving.   Skin: Skin is warm and dry. No rash noted. He is not diaphoretic. There is erythema. No pallor.  The left knee surgical incision is healed. Mild fever and erythema still presents. Thick, yellow, brittle left great toe nail. a blister @ the corner of the right lower lip    Psychiatric: His speech is normal. Judgment and thought content normal. His mood appears anxious. He is slowed and withdrawn. Cognition and memory are impaired. He exhibits abnormal recent memory.  Flat affect-improved.     Filed Vitals:   06/16/14 1026  BP: 134/66  Pulse: 78    Temp: 98.4 F (36.9 C)  TempSrc: Tympanic  Resp: 20      Labs reviewed: Basic Metabolic Panel:  Recent Labs  06/24/13 1430 06/28/13 0541 06/29/13 0540 07/07/13  10/29/13 12/01/13 06/04/14  NA 136 136 135 138  < > 138 141 140  K 4.9 4.1 3.7 3.8  < > 3.9 4.3 4.1  CL 101 103 102  --   --   --   --   --   CO2 26 26 24   --   --   --   --   --   GLUCOSE 97 115* 121*  --   --   --   --   --   BUN 31* 19 22 20   < > 19 29* 27*  CREATININE 0.98 0.83 0.81 0.7  < > 0.7 0.7 0.8  CALCIUM 9.7 8.6 8.5  --   --   --   --   --   TSH  --   --   --  2.35  --   --   --  5.53  < > = values in this interval not displayed. Liver Function Tests:  Recent Labs  06/24/13 1430 07/07/13 10/29/13 12/01/13  AST 30 19 14  13*  ALT 22 19 9* 9*  ALKPHOS 87 114 98 149*  BILITOT 0.6  --   --   --   PROT 6.7  --   --   --   ALBUMIN 3.9  --   --   --    CBC:  Recent Labs  06/29/13 0540 06/30/13 0425 06/30/13 1515 07/01/13 0428  10/13/13 10/29/13 12/01/13  WBC 12.9* 9.7  --  9.3  < > 8.0 11.7 10.8  NEUTROABS  --   --   --  7.0  --   --   --   --   HGB 9.6* 8.3* 8.6* 8.1*  < > 14.6 12.8* 11.5*  HCT 28.2* 24.9* 24.9* 23.5*  < > 43 38* 34*  MCV 93.1 92.6  --  92.5  --   --   --   --   PLT 113* 92*  --  117*  < > 192 199 327  < > = values in this interval not displayed. Lipid Panel: No results found for this basename: CHOL, HDL, LDLCALC, TRIG, CHOLHDL, LDLDIRECT,  in the last 8760 hours  Past Procedures:  10/28/13 CXR minimal cardiomegaly wihtout pulmonary vascular congestion, no pleural effusion, patchy bibasilar pneumonitis.   12/02/13 CT with and without contrast: atrophy and chronic left external capsule infarct. No acute abnormality.   Assessment/Plan Cold sore a blister @ the corner of the right lower lip. Apply Abreva topical 5x/day until healed. Max [phone].   Situational depression 06/16/14 dc Ativan prn not used within the previous 90 days.  Hx of anxiety and Ativan use. Better with  Sertraline 75mg   and Depakote 250mg  daily    BPH (benign prostatic hyperplasia) Takes Tamsulosin. No urinary retention.     Constipation Stable with Senna II qd, MiraLax dailly, Colace 100mg  bid,  and prn Bisacodyl suppository prn     Edema Trace edema RLE and 1+ LLE since Furosemide 20mg  + Kcl 48meq po daily in setting of wheezing/moist rales lower lungs. 06/04/14 Bun/creat 27/0.8    Memory deficit MMSE 25/30, CT identified old CVA-vascular dementia likely. Namenda started and tolerated.      Osteoarthritis of left knee 04/20/14 Ortho: needs 5Ibs on ankle and let his leg extend with the weight  S/p total left joint replacement surgery.  Contracted L knee 05/29/14 c/o both knee pain with weight bearing. 05/29/14 X-ray L  knee/hip: no acute osseous abnormality, chronic and degenerative findings, s/p left total knee arthroplasty.      Unspecified constipation Stable with Senna II qd, MiraLax dailly, Colace 100mg  bid,  and prn Bisacodyl suppository prn    Unspecified hypothyroidism TSH 1.761 12/01/13 06/04/14 TSH 5.533-up Levothyroxine to 80mcg-f/u TSH 8 weeks.      Wheezes Staff noted expiratory wheezes prior getting OOB-resolved, completed Neb x3 days, continued with Furosemide.  05/29/14 CXR minimal cardiomegaly and central pulmonary vascular congestion, without other overt congestive heart failure findings. Minimal left basilar subsegmental atelectatic change, but otherwise negative for suspected focal pneumonia.      Family/ Staff Communication: observe the patient. PT to evaluate and treat   Goals of Care: SNF  Labs/tests ordered: 06/05/14 TSH 8 wks.

## 2014-06-16 NOTE — Assessment & Plan Note (Signed)
Stable with Senna II qd, MiraLax dailly, Colace 100mg bid,  and prn Bisacodyl suppository prn  

## 2014-06-16 NOTE — Assessment & Plan Note (Signed)
TSH 1.761 12/01/13 06/04/14 TSH 5.533-up Levothyroxine to 32mcg-f/u TSH 8 weeks.

## 2014-06-16 NOTE — Assessment & Plan Note (Signed)
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 Namenda started and tolerated.   

## 2014-06-16 NOTE — Assessment & Plan Note (Signed)
06/16/14 dc Ativan prn not used within the previous 90 days.  Hx of anxiety and Ativan use. Better with Sertraline 75mg   and Depakote 250mg  daily

## 2014-07-14 ENCOUNTER — Encounter: Payer: Self-pay | Admitting: Nurse Practitioner

## 2014-07-30 LAB — TSH: TSH: 1.5 u[IU]/mL (ref 0.41–5.90)

## 2014-07-31 ENCOUNTER — Non-Acute Institutional Stay (SKILLED_NURSING_FACILITY): Payer: Medicare Other | Admitting: Nurse Practitioner

## 2014-07-31 ENCOUNTER — Encounter: Payer: Self-pay | Admitting: Nurse Practitioner

## 2014-07-31 DIAGNOSIS — K5900 Constipation, unspecified: Secondary | ICD-10-CM

## 2014-07-31 DIAGNOSIS — R609 Edema, unspecified: Secondary | ICD-10-CM

## 2014-07-31 DIAGNOSIS — E039 Hypothyroidism, unspecified: Secondary | ICD-10-CM

## 2014-07-31 DIAGNOSIS — I69319 Unspecified symptoms and signs involving cognitive functions following cerebral infarction: Secondary | ICD-10-CM

## 2014-07-31 DIAGNOSIS — M1712 Unilateral primary osteoarthritis, left knee: Secondary | ICD-10-CM

## 2014-07-31 DIAGNOSIS — F4321 Adjustment disorder with depressed mood: Secondary | ICD-10-CM

## 2014-07-31 DIAGNOSIS — I6931 Cognitive deficits following cerebral infarction: Secondary | ICD-10-CM

## 2014-07-31 DIAGNOSIS — R413 Other amnesia: Secondary | ICD-10-CM

## 2014-07-31 DIAGNOSIS — R062 Wheezing: Secondary | ICD-10-CM

## 2014-07-31 DIAGNOSIS — N400 Enlarged prostate without lower urinary tract symptoms: Secondary | ICD-10-CM

## 2014-07-31 NOTE — Assessment & Plan Note (Signed)
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 Namenda started and tolerated.   

## 2014-07-31 NOTE — Assessment & Plan Note (Signed)
Stable with Senna II qd, MiraLax dailly, Colace 100mg bid,  and prn Bisacodyl suppository prn  

## 2014-07-31 NOTE — Assessment & Plan Note (Signed)
Takes Tamsulosin. No urinary retention.    

## 2014-07-31 NOTE — Assessment & Plan Note (Signed)
Staff noted expiratory wheezes prior getting OOB-resolved, completed Neb x3 days, continued with Furosemide.  05/29/14 CXR minimal cardiomegaly and central pulmonary vascular congestion, without other overt congestive heart failure findings. Minimal left basilar subsegmental atelectatic change, but otherwise negative for suspected focal pneumonia.  07/31/14 no wheezes noted.

## 2014-07-31 NOTE — Assessment & Plan Note (Signed)
slow mentation with confusion and body movements. CT head 12/02/13 showed chronic left external capsule infarct. Continue SNF for care.  07/14/14 reduce ASA from 325mg bid to qd per consultant pharmacist recommendation.    

## 2014-07-31 NOTE — Assessment & Plan Note (Signed)
Trace edema, continue Furosemide 20mg daily, mild DOE occasionally,  06/04/14 Bun/creat 27/0.8   

## 2014-07-31 NOTE — Assessment & Plan Note (Signed)
Hx of anxiety and Ativan use. Better with Sertraline 75mg   and Depakote 250mg  daily

## 2014-07-31 NOTE — Assessment & Plan Note (Signed)
TSH 1.761 12/01/13 06/04/14 TSH 5.533-up Levothyroxine to 75mcg 07/31/14 TSH 1.502 continue Levothyroxine 75mcg daily.    

## 2014-07-31 NOTE — Assessment & Plan Note (Signed)
04/20/14 Ortho: needs 5Ibs on ankle and let his leg extend with the weight  S/p total left joint replacement surgery.  Contracted L knee 05/29/14 c/o both knee pain with weight bearing. 05/29/14 X-ray L knee/hip: no acute osseous abnormality, chronic and degenerative findings, s/p left total knee arthroplasty.

## 2014-07-31 NOTE — Progress Notes (Signed)
Patient ID: William Roach, male   DOB: 16-Jan-1921, 80 y.o.   MRN: [id number]   Code Status: DNR  No Known Allergies  Chief Complaint  Patient presents with  . Medical Management of Chronic Issues    HPI: Patient is a 80 y.o. male seen in the SNF at [facility] today for evaluation of chronic medical conditions.  Problem List Items Addressed This Visit   Wheezes     Staff noted expiratory wheezes prior getting OOB-resolved, completed Neb x3 days, continued with Furosemide.  05/29/14 CXR minimal cardiomegaly and central pulmonary vascular congestion, without other overt congestive heart failure findings. Minimal left basilar subsegmental atelectatic change, but otherwise negative for suspected focal pneumonia.  07/31/14 no wheezes noted.     Situational depression     Hx of anxiety and Ativan use. Better with Sertraline 75mg   and Depakote 250mg  daily      Osteoarthritis of left knee     04/20/14 Ortho: needs 5Ibs on ankle and let his leg extend with the weight  S/p total left joint replacement surgery.  Contracted L knee 05/29/14 c/o both knee pain with weight bearing. 05/29/14 X-ray L knee/hip: no acute osseous abnormality, chronic and degenerative findings, s/p left total knee arthroplasty.        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 Namenda started and tolerated.         Hypothyroidism - Primary     TSH 1.761 12/01/13 06/04/14 TSH 5.533-up Levothyroxine to 34mcg 07/31/14 TSH 1.502 continue Levothyroxine 47mcg daily.        Edema     Trace edema, continue Furosemide 20mg  daily, mild DOE occasionally,  06/04/14 Bun/creat 27/0.8      CVA, old, cognitive deficits     slow mentation with confusion and body movements. CT head 12/02/13 showed chronic left external capsule infarct. Continue SNF for care.  07/14/14 reduce ASA from 325mg  bid to qd per consultant pharmacist recommendation.      Constipation     Stable with Senna II  qd, MiraLax dailly, Colace 100mg  bid,  and prn Bisacodyl suppository prn       BPH (benign prostatic hyperplasia)     Takes Tamsulosin. No urinary retention.           Review of Systems:  Review of Systems  Constitutional: Negative for fever, chills, weight loss and malaise/fatigue.       Generalized weakness-no longer ambulates with walker and difficulty transferring self  HENT: Negative for congestion, ear discharge, ear pain, hearing loss, nosebleeds, sore throat and tinnitus.   Eyes: Negative for blurred vision, double vision, photophobia, pain, discharge and redness.  Respiratory: Negative for cough, hemoptysis, sputum production, shortness of breath, wheezing and stridor.        Prior to getting OOB-better  Cardiovascular: Positive for leg swelling. Negative for chest pain, palpitations, orthopnea, claudication and PND.       Trace RLE. 1+ LLE  Gastrointestinal: Negative for heartburn, nausea, vomiting, abdominal pain, diarrhea, constipation, blood in stool and melena.  Genitourinary: Negative for dysuria, urgency, frequency, hematuria and flank pain.  Musculoskeletal: Positive for falls and joint pain. Negative for back pain, myalgias and neck pain.       Left knee and right knee. [place] 03/06/14 In his room when transferred himself from w/c to bed w/o apparent injury noted.   Skin: Negative for itching and rash.       The left knee surgical incision  is healed. Mild fever and erythema still presents.   Neurological: Positive for sensory change and weakness. Negative for dizziness, tingling, tremors, speech change, focal weakness, seizures, loss of consciousness and headaches.       Numbness in BLE/feet in am-better once he is up. Difficulty ambulates with walker independently  Endo/Heme/Allergies: Negative for environmental allergies and polydipsia. Does not bruise/bleed easily.  Psychiatric/Behavioral: Positive for depression and memory loss. Negative for suicidal ideas,  hallucinations and substance abuse. The patient is nervous/anxious. The patient does not have insomnia.        Sad facial looks-better/brighter. Sleeps well at night      Past Medical History  Diagnosis Date  . Hypercholesterolemia   . Fatigue   . Basal cell carcinoma, ear     post -- partial pinnectomy left side with primary closure    . History of kidney stones     x1  . Acute blood loss anemia 06/30/2013  . Adjustment disorder with mixed anxiety and depressed mood 07/04/2013  . CAD (coronary artery disease) 07/04/2011    S/p CABG   . CVA, old, cognitive deficits 12/05/2013  . Dizziness and giddiness 10/07/2013  . Gait difficulty 02/17/2014  . HTN (hypertension) 07/04/2011  . Hyperlipidemia 07/04/2011  . Lack of coordination 11/08/2013  . Memory deficit 07/29/2013  . Numbness in both legs 10/31/2013  . Osteoarthritis of left knee 06/27/2013  . PAD (peripheral artery disease) 07/29/2013  . S/P knee replacement 07/01/2013  . Situational depression 10/17/2013  . Unspecified constipation 07/01/2013  . Unspecified hypothyroidism 07/01/2013     TSH 1.761 12/01/13     . Weakness 12/01/2013   Past Surgical History  Procedure Laterality Date  . Coronary artery bypass graft  2003    x4 --   . Cholecystectomy  2002  . Inguinal hernia repair  09/19/2007  . Ear cyst excision  12/14/2009    Excision of left auricular cyst with primary closure  . Basal cell carcinoma excision  01/14/2010    Wedge resection, partial pinnectomy left side with primary closure      . Tonsillectomy    . Carpal tunnel release    . Cataract surgery Bilateral 06-24-13  . Tonsillectomy    . Cystoscopy    . Total knee arthroplasty Left 06/27/2013    Procedure: LEFT TOTAL KNEE ARTHROPLASTY;  Surgeon: Tobi Bastos, MD;  Location: [facility];  Service: Orthopedics;  Laterality: Left;   Social History:   reports that he quit smoking about 33 years ago. He does not have any smokeless tobacco history on file. He reports that he does  not drink alcohol or use illicit drugs.  Family History  Problem Relation Age of Onset  . Parkinsonism Father   . Angina Mother   . Coronary artery disease Brother     with CABG    Medications: Patient's Medications  New Prescriptions   No medications on file  Previous Medications   ASPIRIN 325 MG TABLET    Take 325 mg by mouth 2 (two) times daily.   BISACODYL (DULCOLAX) 10 MG SUPPOSITORY    Place 1 suppository (10 mg total) rectally daily as needed.   DOCUSATE SODIUM (COLACE) 100 MG CAPSULE    Take 100 mg by mouth 2 (two) times daily.   LEVOTHYROXINE (SYNTHROID, LEVOTHROID) 75 MCG TABLET    Take 75 mcg by mouth daily before breakfast.   LORAZEPAM (ATIVAN) 0.5 MG TABLET    0.5 mg every 4 (four) hours as needed.  MEMANTINE HCL ER (NAMENDA XR) 28 MG CP24    Take by mouth.   OXYCODONE (OXY IR/ROXICODONE) 5 MG IMMEDIATE RELEASE TABLET    Take 1-2 tablets (5-10 mg total) by mouth every 3 (three) hours as needed.   POLYETHYLENE GLYCOL (MIRALAX / GLYCOLAX) PACKET    Take 17 g by mouth daily as needed.   SENNA (SENOKOT) 8.6 MG TABLET    Take 2 tablets by mouth daily.    SERTRALINE (ZOLOFT) 25 MG TABLET    Take 75 mg by mouth daily.   TAMSULOSIN (FLOMAX) 0.4 MG CAPS CAPSULE    Take 1 capsule (0.4 mg total) by mouth daily.  Modified Medications   No medications on file  Discontinued Medications   No medications on file     Physical Exam: Physical Exam  Constitutional: He is oriented to person, place, and time. He appears well-developed and well-nourished. No distress.  generalized weakness. No longer transferring self and ambulates with walker.   HENT:  Head: Normocephalic and atraumatic.  Right Ear: External ear normal.  Left Ear: External ear normal.  Nose: Nose normal.  Mouth/Throat: Oropharynx is clear and moist. No oropharyngeal exudate.  Eyes: Conjunctivae and EOM are normal. Pupils are equal, round, and reactive to light. Right eye exhibits no discharge. Left eye exhibits no  discharge. No scleral icterus.  Neck: Normal range of motion. Neck supple. No JVD present. No tracheal deviation present. No thyromegaly present.  Cardiovascular: Normal rate, regular rhythm, normal heart sounds and intact distal pulses.   No murmur heard. Pulmonary/Chest: Effort normal. No stridor. No respiratory distress. He has no wheezes. He has rales. He exhibits no tenderness.  moist rales posterior lower lungs and wheezes prior to getting OOB-improved.   Abdominal: Soft. Bowel sounds are normal. He exhibits no distension. There is no tenderness. There is no rebound and no guarding.  Genitourinary: Penis normal.  Musculoskeletal: Normal range of motion. He exhibits edema and tenderness.   Left knee mild swelling and warmth with limited flexion and extension ROM. LLE edema 1+ and trace edema RLE. C/o R+L knee pain.   Lymphadenopathy:    He has no cervical adenopathy.  Neurological: He is alert and oriented to person, place, and time. He has normal reflexes. No cranial nerve deficit. He exhibits normal muscle tone. Coordination abnormal.  Decreased coordination of the RUE-improving.   Skin: Skin is warm and dry. No rash noted. He is not diaphoretic. There is erythema. No pallor.  The left knee surgical incision is healed. Mild fever and erythema still presents.    Psychiatric: His speech is normal. Judgment and thought content normal. His mood appears anxious. He is slowed and withdrawn. Cognition and memory are impaired. He exhibits abnormal recent memory.  Flat affect-improved.     Filed Vitals:   07/31/14 1425  BP: 100/60  Pulse: 66  Temp: 97.5 F (36.4 C)  TempSrc: Tympanic  Resp: 16      Labs reviewed: Basic Metabolic Panel:  Recent Labs  10/29/13 12/01/13 06/04/14 07/30/14  NA 138 141 140  --   K 3.9 4.3 4.1  --   BUN 19 29* 27*  --   CREATININE 0.7 0.7 0.8  --   TSH  --   --  5.53 1.50   Liver Function Tests:  Recent Labs  10/29/13 12/01/13  AST 14 13*  ALT  9* 9*  ALKPHOS 98 149*   CBC:  Recent Labs  10/13/13 10/29/13 12/01/13  WBC 8.0 11.7 10.8  HGB 14.6 12.8* 11.5*  HCT 43 38* 34*  PLT 192 199 327   Lipid Panel: No results found for this basename: CHOL, HDL, LDLCALC, TRIG, CHOLHDL, LDLDIRECT,  in the last 8760 hours  Past Procedures:  10/28/13 CXR minimal cardiomegaly wihtout pulmonary vascular congestion, no pleural effusion, patchy bibasilar pneumonitis.   12/02/13 CT with and without contrast: atrophy and chronic left external capsule infarct. No acute abnormality.   Assessment/Plan Hypothyroidism TSH 1.761 12/01/13 06/04/14 TSH 5.533-up Levothyroxine to 63mcg 07/31/14 TSH 1.502 continue Levothyroxine 16mcg daily.      Edema Trace edema, continue Furosemide 20mg  daily, mild DOE occasionally,  06/04/14 Bun/creat 27/0.8    CVA, old, cognitive deficits slow mentation with confusion and body movements. CT head 12/02/13 showed chronic left external capsule infarct. Continue SNF for care.  07/14/14 reduce ASA from 325mg  bid to qd per consultant pharmacist recommendation.    Constipation Stable with Senna II qd, MiraLax dailly, Colace 100mg  bid,  and prn Bisacodyl suppository prn     BPH (benign prostatic hyperplasia) Takes Tamsulosin. No urinary retention.      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 Namenda started and tolerated.       Osteoarthritis of left knee 04/20/14 Ortho: needs 5Ibs on ankle and let his leg extend with the weight  S/p total left joint replacement surgery.  Contracted L knee 05/29/14 c/o both knee pain with weight bearing. 05/29/14 X-ray L knee/hip: no acute osseous abnormality, chronic and degenerative findings, s/p left total knee arthroplasty.      Situational depression Hx of anxiety and Ativan use. Better with Sertraline 75mg   and Depakote 250mg  daily    Unspecified constipation Stable with Senna II qd, MiraLax dailly, Colace 100mg  bid,  and prn  Bisacodyl suppository prn    Wheezes Staff noted expiratory wheezes prior getting OOB-resolved, completed Neb x3 days, continued with Furosemide.  05/29/14 CXR minimal cardiomegaly and central pulmonary vascular congestion, without other overt congestive heart failure findings. Minimal left basilar subsegmental atelectatic change, but otherwise negative for suspected focal pneumonia.  07/31/14 no wheezes noted.     Family/ Staff Communication: observe the patient. PT to evaluate and treat   Goals of Care: SNF  Labs/tests ordered: none

## 2014-08-25 ENCOUNTER — Non-Acute Institutional Stay (SKILLED_NURSING_FACILITY): Payer: Medicare Other | Admitting: Nurse Practitioner

## 2014-08-25 ENCOUNTER — Encounter: Payer: Self-pay | Admitting: Nurse Practitioner

## 2014-08-25 DIAGNOSIS — N400 Enlarged prostate without lower urinary tract symptoms: Secondary | ICD-10-CM

## 2014-08-25 DIAGNOSIS — I69319 Unspecified symptoms and signs involving cognitive functions following cerebral infarction: Secondary | ICD-10-CM

## 2014-08-25 DIAGNOSIS — E039 Hypothyroidism, unspecified: Secondary | ICD-10-CM

## 2014-08-25 DIAGNOSIS — I6931 Cognitive deficits following cerebral infarction: Secondary | ICD-10-CM

## 2014-08-25 DIAGNOSIS — F4321 Adjustment disorder with depressed mood: Secondary | ICD-10-CM

## 2014-08-25 DIAGNOSIS — R062 Wheezing: Secondary | ICD-10-CM

## 2014-08-25 DIAGNOSIS — M1712 Unilateral primary osteoarthritis, left knee: Secondary | ICD-10-CM

## 2014-08-25 DIAGNOSIS — R609 Edema, unspecified: Secondary | ICD-10-CM

## 2014-08-25 NOTE — Assessment & Plan Note (Signed)
Hx of anxiety and Ativan use. Better with Sertraline 75mg   and Depakote 250mg  daily

## 2014-08-25 NOTE — Assessment & Plan Note (Signed)
Staff noted expiratory wheezes prior getting OOB-resolved, completed Neb x3 days, continued with Furosemide.  05/29/14 CXR minimal cardiomegaly and central pulmonary vascular congestion, without other overt congestive heart failure findings. Minimal left basilar subsegmental atelectatic change, but otherwise negative for suspected focal pneumonia.  07/31/14 no wheezes noted.  08/24/14 no wheezes

## 2014-08-25 NOTE — Assessment & Plan Note (Signed)
Trace edema, continue Furosemide 20mg daily, mild DOE occasionally,  06/04/14 Bun/creat 27/0.8   

## 2014-08-25 NOTE — Assessment & Plan Note (Signed)
Stable with Senna II qd, MiraLax dailly, Colace 100mg bid,  and prn Bisacodyl suppository prn  

## 2014-08-25 NOTE — Assessment & Plan Note (Signed)
04/20/14 Ortho: needs 5Ibs on ankle and let his leg extend with the weight  S/p total left joint replacement surgery.  Contracted L knee 05/29/14 c/o both knee pain with weight bearing. 05/29/14 X-ray L knee/hip: no acute osseous abnormality, chronic and degenerative findings, s/p left total knee arthroplasty.

## 2014-08-25 NOTE — Assessment & Plan Note (Signed)
TSH 1.761 12/01/13 06/04/14 TSH 5.533-up Levothyroxine to 75mcg 07/31/14 TSH 1.502 continue Levothyroxine 75mcg daily.    

## 2014-08-25 NOTE — Assessment & Plan Note (Signed)
Takes Tamsulosin. No urinary retention.    

## 2014-08-25 NOTE — Progress Notes (Signed)
Patient ID: William Roach, male   DOB: [birth year], 80 y.o.   MRN: [id number]   Code Status: DNR  No Known Allergies  Chief Complaint  Patient presents with  . Medical Management of Chronic Issues    HPI: Patient is a 81 y.o. male seen in the SNF at [facility] today for evaluation of chronic medical conditions.  Problem List Items Addressed This Visit    Wheezes    Staff noted expiratory wheezes prior getting OOB-resolved, completed Neb x3 days, continued with Furosemide.  05/29/14 CXR minimal cardiomegaly and central pulmonary vascular congestion, without other overt congestive heart failure findings. Minimal left basilar subsegmental atelectatic change, but otherwise negative for suspected focal pneumonia.  07/31/14 no wheezes noted.  08/24/14 no wheezes    Situational depression    Hx of anxiety and Ativan use. Better with Sertraline 75mg   and Depakote 250mg  daily       Osteoarthritis of left knee    04/20/14 Ortho: needs 5Ibs on ankle and let his leg extend with the weight  S/p total left joint replacement surgery.  Contracted L knee 05/29/14 c/o both knee pain with weight bearing. 05/29/14 X-ray L knee/hip: no acute osseous abnormality, chronic and degenerative findings, s/p left total knee arthroplasty.        Hypothyroidism    TSH 1.761 12/01/13 06/04/14 TSH 5.533-up Levothyroxine to 6mcg 07/31/14 TSH 1.502 continue Levothyroxine 42mcg daily.         Edema - Primary    Trace edema, continue Furosemide 20mg  daily, mild DOE occasionally,  06/04/14 Bun/creat 27/0.8     CVA, old, cognitive deficits    slow mentation with confusion and body movements. CT head 12/02/13 showed chronic left external capsule infarct. Continue SNF for care.  07/14/14 reduce ASA from 325mg  bid to qd per consultant pharmacist recommendation.       BPH (benign prostatic hyperplasia)    Takes Tamsulosin. No urinary retention.          Review of Systems:  Review of Systems    Constitutional: Negative for fever, chills, weight loss and malaise/fatigue.       Generalized weakness-no longer ambulates with walker and difficulty transferring self  HENT: Negative for congestion, ear discharge, ear pain, hearing loss, nosebleeds, sore throat and tinnitus.   Eyes: Negative for blurred vision, double vision, photophobia, pain, discharge and redness.  Respiratory: Negative for cough, hemoptysis, sputum production, shortness of breath, wheezing and stridor.        Prior to getting OOB-better  Cardiovascular: Positive for leg swelling. Negative for chest pain, palpitations, orthopnea, claudication and PND.       Trace RLE. 1+ LLE  Gastrointestinal: Negative for heartburn, nausea, vomiting, abdominal pain, diarrhea, constipation, blood in stool and melena.  Genitourinary: Negative for dysuria, urgency, frequency, hematuria and flank pain.  Musculoskeletal: Positive for joint pain and falls. Negative for myalgias, back pain and neck pain.       Left knee and right knee. [place] 03/06/14 In his room when transferred himself from w/c to bed w/o apparent injury noted.   Skin: Negative for itching and rash.       The left knee surgical incision is healed. Mild fever and erythema still presents.   Neurological: Positive for sensory change and weakness. Negative for dizziness, tingling, tremors, speech change, focal weakness, seizures, loss of consciousness and headaches.       Numbness in BLE/feet in am-better once he is up. Difficulty ambulates with walker independently  Endo/Heme/Allergies:  Negative for environmental allergies and polydipsia. Does not bruise/bleed easily.  Psychiatric/Behavioral: Positive for depression and memory loss. Negative for suicidal ideas, hallucinations and substance abuse. The patient is nervous/anxious. The patient does not have insomnia.        Sad facial looks-better/brighter. Sleeps well at night      Past Medical History  Diagnosis Date  .  Hypercholesterolemia   . Fatigue   . Basal cell carcinoma, ear     post -- partial pinnectomy left side with primary closure    . History of kidney stones     x1  . Acute blood loss anemia 06/30/2013  . Adjustment disorder with mixed anxiety and depressed mood 07/04/2013  . CAD (coronary artery disease) 07/04/2011    S/p CABG   . CVA, old, cognitive deficits 12/05/2013  . Dizziness and giddiness 10/07/2013  . Gait difficulty 02/17/2014  . HTN (hypertension) 07/04/2011  . Hyperlipidemia 07/04/2011  . Lack of coordination 11/08/2013  . Memory deficit 07/29/2013  . Numbness in both legs 10/31/2013  . Osteoarthritis of left knee 06/27/2013  . PAD (peripheral artery disease) 07/29/2013  . S/P knee replacement 07/01/2013  . Situational depression 10/17/2013  . Unspecified constipation 07/01/2013  . Unspecified hypothyroidism 07/01/2013     TSH 1.761 12/01/13     . Weakness 12/01/2013   Past Surgical History  Procedure Laterality Date  . Coronary artery bypass graft  2003    x4 --   . Cholecystectomy  2002  . Inguinal hernia repair  09/19/2007  . Ear cyst excision  12/14/2009    Excision of left auricular cyst with primary closure  . Basal cell carcinoma excision  01/14/2010    Wedge resection, partial pinnectomy left side with primary closure      . Tonsillectomy    . Carpal tunnel release    . Cataract surgery Bilateral 06-24-13  . Tonsillectomy    . Cystoscopy    . Total knee arthroplasty Left 06/27/2013    Procedure: LEFT TOTAL KNEE ARTHROPLASTY;  Surgeon: Tobi Bastos, MD;  Location: [facility];  Service: Orthopedics;  Laterality: Left;   Social History:   reports that he quit smoking about 33 years ago. He does not have any smokeless tobacco history on file. He reports that he does not drink alcohol or use illicit drugs.  Family History  Problem Relation Age of Onset  . Parkinsonism Father   . Angina Mother   . Coronary artery disease Brother     with CABG    Medications: Patient's  Medications  New Prescriptions   No medications on file  Previous Medications   ASPIRIN 325 MG TABLET    Take 325 mg by mouth 2 (two) times daily.   BISACODYL (DULCOLAX) 10 MG SUPPOSITORY    Place 1 suppository (10 mg total) rectally daily as needed.   DOCUSATE SODIUM (COLACE) 100 MG CAPSULE    Take 100 mg by mouth 2 (two) times daily.   LEVOTHYROXINE (SYNTHROID, LEVOTHROID) 75 MCG TABLET    Take 75 mcg by mouth daily before breakfast.   LORAZEPAM (ATIVAN) 0.5 MG TABLET    0.5 mg every 4 (four) hours as needed.    MEMANTINE HCL ER (NAMENDA XR) 28 MG CP24    Take by mouth.   OXYCODONE (OXY IR/ROXICODONE) 5 MG IMMEDIATE RELEASE TABLET    Take 1-2 tablets (5-10 mg total) by mouth every 3 (three) hours as needed.   POLYETHYLENE GLYCOL (MIRALAX / GLYCOLAX) PACKET  Take 17 g by mouth daily as needed.   SENNA (SENOKOT) 8.6 MG TABLET    Take 2 tablets by mouth daily.    SERTRALINE (ZOLOFT) 25 MG TABLET    Take 75 mg by mouth daily.   TAMSULOSIN (FLOMAX) 0.4 MG CAPS CAPSULE    Take 1 capsule (0.4 mg total) by mouth daily.  Modified Medications   No medications on file  Discontinued Medications   No medications on file     Physical Exam: Physical Exam  Constitutional: He is oriented to person, place, and time. He appears well-developed and well-nourished. No distress.  generalized weakness. No longer transferring self and ambulates with walker.   HENT:  Head: Normocephalic and atraumatic.  Right Ear: External ear normal.  Left Ear: External ear normal.  Nose: Nose normal.  Mouth/Throat: Oropharynx is clear and moist. No oropharyngeal exudate.  Eyes: Conjunctivae and EOM are normal. Pupils are equal, round, and reactive to light. Right eye exhibits no discharge. Left eye exhibits no discharge. No scleral icterus.  Neck: Normal range of motion. Neck supple. No JVD present. No tracheal deviation present. No thyromegaly present.  Cardiovascular: Normal rate, regular rhythm, normal heart sounds  and intact distal pulses.   No murmur heard. Pulmonary/Chest: Effort normal. No stridor. No respiratory distress. He has no wheezes. He has rales. He exhibits no tenderness.  moist rales posterior lower lungs   Abdominal: Soft. Bowel sounds are normal. He exhibits no distension. There is no tenderness. There is no rebound and no guarding.  Genitourinary: Penis normal.  Musculoskeletal: Normal range of motion. He exhibits edema and tenderness.   Left knee mild swelling and warmth with limited flexion and extension ROM. LLE edema 1+ and trace edema RLE. C/o R+L knee pain.   Lymphadenopathy:    He has no cervical adenopathy.  Neurological: He is alert and oriented to person, place, and time. He has normal reflexes. No cranial nerve deficit. He exhibits normal muscle tone. Coordination abnormal.  Decreased coordination of the RUE-improving.   Skin: Skin is warm and dry. No rash noted. He is not diaphoretic. There is erythema. No pallor.  The left knee surgical incision is healed. Mild fever and erythema still presents.    Psychiatric: His speech is normal. Judgment and thought content normal. His mood appears anxious. He is slowed and withdrawn. Cognition and memory are impaired. He exhibits abnormal recent memory.  Flat affect-improved.     Filed Vitals:   08/25/14 1128  BP: 114/64  Pulse: 68  Temp: 97.7 F (36.5 C)  TempSrc: Tympanic  Resp: 18      Labs reviewed: Basic Metabolic Panel:  Recent Labs  10/29/13 12/01/13 06/04/14 07/30/14  NA 138 141 140  --   K 3.9 4.3 4.1  --   BUN 19 29* 27*  --   CREATININE 0.7 0.7 0.8  --   TSH  --   --  5.53 1.50   Liver Function Tests:  Recent Labs  10/29/13 12/01/13  AST 14 13*  ALT 9* 9*  ALKPHOS 98 149*   CBC:  Recent Labs  10/13/13 10/29/13 12/01/13  WBC 8.0 11.7 10.8  HGB 14.6 12.8* 11.5*  HCT 43 38* 34*  PLT 192 199 327   Lipid Panel: No results for input(s): CHOL, HDL, LDLCALC, TRIG, CHOLHDL, LDLDIRECT in the last  8760 hours.  Past Procedures:  10/28/13 CXR minimal cardiomegaly wihtout pulmonary vascular congestion, no pleural effusion, patchy bibasilar pneumonitis.   12/02/13 CT with and  without contrast: atrophy and chronic left external capsule infarct. No acute abnormality.   Assessment/Plan Wheezes Staff noted expiratory wheezes prior getting OOB-resolved, completed Neb x3 days, continued with Furosemide.  05/29/14 CXR minimal cardiomegaly and central pulmonary vascular congestion, without other overt congestive heart failure findings. Minimal left basilar subsegmental atelectatic change, but otherwise negative for suspected focal pneumonia.  07/31/14 no wheezes noted.  08/24/14 no wheezes  Unspecified constipation Stable with Senna II qd, MiraLax dailly, Colace 100mg  bid,  and prn Bisacodyl suppository prn     Situational depression Hx of anxiety and Ativan use. Better with Sertraline 75mg   and Depakote 250mg  daily     Osteoarthritis of left knee 04/20/14 Ortho: needs 5Ibs on ankle and let his leg extend with the weight  S/p total left joint replacement surgery.  Contracted L knee 05/29/14 c/o both knee pain with weight bearing. 05/29/14 X-ray L knee/hip: no acute osseous abnormality, chronic and degenerative findings, s/p left total knee arthroplasty.      Hypothyroidism TSH 1.761 12/01/13 06/04/14 TSH 5.533-up Levothyroxine to 2mcg 07/31/14 TSH 1.502 continue Levothyroxine 81mcg daily.       Edema Trace edema, continue Furosemide 20mg  daily, mild DOE occasionally,  06/04/14 Bun/creat 27/0.8   CVA, old, cognitive deficits slow mentation with confusion and body movements. CT head 12/02/13 showed chronic left external capsule infarct. Continue SNF for care.  07/14/14 reduce ASA from 325mg  bid to qd per consultant pharmacist recommendation.     BPH (benign prostatic hyperplasia) Takes Tamsulosin. No urinary retention.       Family/ Staff Communication: observe the patient.     Goals of Care: SNF  Labs/tests ordered: none

## 2014-08-25 NOTE — Assessment & Plan Note (Signed)
slow mentation with confusion and body movements. CT head 12/02/13 showed chronic left external capsule infarct. Continue SNF for care.  07/14/14 reduce ASA from 325mg bid to qd per consultant pharmacist recommendation.    

## 2014-09-18 ENCOUNTER — Non-Acute Institutional Stay (SKILLED_NURSING_FACILITY): Payer: Medicare Other | Admitting: Nurse Practitioner

## 2014-09-18 ENCOUNTER — Encounter: Payer: Self-pay | Admitting: Nurse Practitioner

## 2014-09-18 DIAGNOSIS — I69319 Unspecified symptoms and signs involving cognitive functions following cerebral infarction: Secondary | ICD-10-CM

## 2014-09-18 DIAGNOSIS — M1712 Unilateral primary osteoarthritis, left knee: Secondary | ICD-10-CM

## 2014-09-18 DIAGNOSIS — R1032 Left lower quadrant pain: Secondary | ICD-10-CM

## 2014-09-18 DIAGNOSIS — R062 Wheezing: Secondary | ICD-10-CM

## 2014-09-18 DIAGNOSIS — N400 Enlarged prostate without lower urinary tract symptoms: Secondary | ICD-10-CM

## 2014-09-18 DIAGNOSIS — R1031 Right lower quadrant pain: Secondary | ICD-10-CM | POA: Insufficient documentation

## 2014-09-18 DIAGNOSIS — F4321 Adjustment disorder with depressed mood: Secondary | ICD-10-CM

## 2014-09-18 DIAGNOSIS — R609 Edema, unspecified: Secondary | ICD-10-CM

## 2014-09-18 DIAGNOSIS — E039 Hypothyroidism, unspecified: Secondary | ICD-10-CM

## 2014-09-18 DIAGNOSIS — I739 Peripheral vascular disease, unspecified: Secondary | ICD-10-CM

## 2014-09-18 DIAGNOSIS — I6931 Cognitive deficits following cerebral infarction: Secondary | ICD-10-CM

## 2014-09-18 NOTE — Assessment & Plan Note (Signed)
TSH 1.761 12/01/13 06/04/14 TSH 5.533-up Levothyroxine to 2mcg 07/31/14 TSH 1.502 continue Levothyroxine 28mcg daily.

## 2014-09-18 NOTE — Assessment & Plan Note (Signed)
04/20/14 Ortho: needs 5Ibs on ankle and let his leg extend with the weight  S/p total left joint replacement surgery.  Contracted L knee 05/29/14 c/o both knee pain with weight bearing. 05/29/14 X-ray L knee/hip: no acute osseous abnormality, chronic and degenerative findings, s/p left total knee arthroplasty.

## 2014-09-18 NOTE — Assessment & Plan Note (Signed)
Trace edema, continue Furosemide 20mg  daily, mild DOE occasionally,  06/04/14 Bun/creat 27/0.8

## 2014-09-18 NOTE — Assessment & Plan Note (Signed)
Takes Tamsulosin. No urinary retention.

## 2014-09-18 NOTE — Assessment & Plan Note (Signed)
Stable with Senna II qd, MiraLax dailly, Colace 100mg  bid,  and prn Bisacodyl suppository prn

## 2014-09-18 NOTE — Assessment & Plan Note (Signed)
Per arterial US 07/04/13 diminished flow especially in the left common femoral artery and deep femoral artery areas. Risk reduction.

## 2014-09-18 NOTE — Assessment & Plan Note (Signed)
slow mentation with confusion and body movements. CT head 12/02/13 showed chronic left external capsule infarct. Continue SNF for care.  07/14/14 reduce ASA from 325mg  bid to qd per consultant pharmacist recommendation.

## 2014-09-18 NOTE — Assessment & Plan Note (Signed)
06/16/14 dc Ativan prn not used within the previous 90 days.  Hx of anxiety and Ativan use. Better with Sertraline 75mg   and Depakote 250mg  daily 09/18/14 stable in mood.

## 2014-09-18 NOTE — Progress Notes (Signed)
Patient ID: William Roach, male   DOB: 1920-11-21, 80 y.o.   MRN: [id number]   Code Status: DNR  No Known Allergies  Chief Complaint  Patient presents with  . Medical Management of Chronic Issues  . Acute Visit    tingling sensation across lower abd at night.     HPI: Patient is a 80 y.o. male seen in the SNF at [facility] today for evaluation of c/o tingling sensation across lower abd at night and chronic medical conditions.  Problem List Items Addressed This Visit    Wheezes - Primary    Staff noted expiratory wheezes prior getting OOB-resolved, completed Neb x3 days, continued with Furosemide.  05/29/14 CXR minimal cardiomegaly and central pulmonary vascular congestion, without other overt congestive heart failure findings. Minimal left basilar subsegmental atelectatic change, but otherwise negative for suspected focal pneumonia.  07/31/14 no wheezes noted.  08/24/14 no wheezes 12/17.15 no wheezes     Situational depression    06/16/14 dc Ativan prn not used within the previous 90 days.  Hx of anxiety and Ativan use. Better with Sertraline 75mg   and Depakote 250mg  daily 09/18/14 stable in mood.       PAD (peripheral artery disease)    Per arterial US 07/04/13 diminished flow especially in the left common femoral artery and deep femoral artery areas. Risk reduction.        Osteoarthritis of left knee    04/20/14 Ortho: needs 5Ibs on ankle and let his leg extend with the weight  S/p total left joint replacement surgery.  Contracted L knee 05/29/14 c/o both knee pain with weight bearing. 05/29/14 X-ray L knee/hip: no acute osseous abnormality, chronic and degenerative findings, s/p left total knee arthroplasty.         Hypothyroidism    TSH 1.761 12/01/13 06/04/14 TSH 5.533-up Levothyroxine to 63mcg 07/31/14 TSH 1.502 continue Levothyroxine 35mcg daily.       Edema    Trace edema, continue Furosemide 20mg  daily, mild DOE occasionally,  06/04/14 Bun/creat  27/0.8      CVA, old, cognitive deficits    slow mentation with confusion and body movements. CT head 12/02/13 showed chronic left external capsule infarct. Continue SNF for care.  07/14/14 reduce ASA from 325mg  bid to qd per consultant pharmacist recommendation.       BPH (benign prostatic hyperplasia)    Takes Tamsulosin. No urinary retention.       Abdominal discomfort, bilateral lower quadrant    C/o tingling sensation across lower abd-denied pain or dysuria, will UA C/S, CBC, and CMP in am. Continue to observe the patient.        Review of Systems:  Review of Systems  Constitutional: Negative for fever, chills, weight loss and malaise/fatigue.       Generalized weakness-no longer ambulates with walker and difficulty transferring self  HENT: Negative for congestion, ear discharge, ear pain, hearing loss, nosebleeds, sore throat and tinnitus.   Eyes: Negative for blurred vision, double vision, photophobia, pain, discharge and redness.  Respiratory: Negative for cough, hemoptysis, sputum production, shortness of breath, wheezing and stridor.        Prior to getting OOB-better  Cardiovascular: Positive for leg swelling. Negative for chest pain, palpitations, orthopnea, claudication and PND.       Trace RLE. 1+ LLE  Gastrointestinal: Negative for heartburn, nausea, vomiting, abdominal pain, diarrhea, constipation, blood in stool and melena.  Genitourinary: Negative for dysuria, urgency, frequency, hematuria and flank pain.  Tingling sensation across lower abd at night. Purulent discharge from penis.    Musculoskeletal: Positive for joint pain and falls. Negative for myalgias, back pain and neck pain.       Left knee and right knee. [place] 03/06/14 In his room when transferred himself from w/c to bed w/o apparent injury noted.   Skin: Negative for itching and rash.       The left knee surgical incision is healed. Mild fever and erythema still presents.   Neurological: Positive  for sensory change and weakness. Negative for dizziness, tingling, tremors, speech change, focal weakness, seizures, loss of consciousness and headaches.       Numbness in BLE/feet in am-better once he is up. Difficulty ambulates with walker independently  Endo/Heme/Allergies: Negative for environmental allergies and polydipsia. Does not bruise/bleed easily.  Psychiatric/Behavioral: Positive for depression and memory loss. Negative for suicidal ideas, hallucinations and substance abuse. The patient is nervous/anxious. The patient does not have insomnia.        Sad facial looks-better/brighter. Sleeps well at night      Past Medical History  Diagnosis Date  . Hypercholesterolemia   . Fatigue   . Basal cell carcinoma, ear     post -- partial pinnectomy left side with primary closure    . History of kidney stones     x1  . Acute blood loss anemia 06/30/2013  . Adjustment disorder with mixed anxiety and depressed mood 07/04/2013  . CAD (coronary artery disease) 07/04/2011    S/p CABG   . CVA, old, cognitive deficits 12/05/2013  . Dizziness and giddiness 10/07/2013  . Gait difficulty 02/17/2014  . HTN (hypertension) 07/04/2011  . Hyperlipidemia 07/04/2011  . Lack of coordination 11/08/2013  . Memory deficit 07/29/2013  . Numbness in both legs 10/31/2013  . Osteoarthritis of left knee 06/27/2013  . PAD (peripheral artery disease) 07/29/2013  . S/P knee replacement 07/01/2013  . Situational depression 10/17/2013  . Unspecified constipation 07/01/2013  . Unspecified hypothyroidism 07/01/2013     TSH 1.761 12/01/13     . Weakness 12/01/2013   Past Surgical History  Procedure Laterality Date  . Coronary artery bypass graft  2003    x4 --   . Cholecystectomy  2002  . Inguinal hernia repair  09/19/2007  . Ear cyst excision  12/14/2009    Excision of left auricular cyst with primary closure  . Basal cell carcinoma excision  01/14/2010    Wedge resection, partial pinnectomy left side with primary closure       . Tonsillectomy    . Carpal tunnel release    . Cataract surgery Bilateral 06-24-13  . Tonsillectomy    . Cystoscopy    . Total knee arthroplasty Left 06/27/2013    Procedure: LEFT TOTAL KNEE ARTHROPLASTY;  Surgeon: Tobi Bastos, MD;  Location: [facility];  Service: Orthopedics;  Laterality: Left;   Social History:   reports that he quit smoking about 33 years ago. He does not have any smokeless tobacco history on file. He reports that he does not drink alcohol or use illicit drugs.  Family History  Problem Relation Age of Onset  . Parkinsonism Father   . Angina Mother   . Coronary artery disease Brother     with CABG    Medications: Patient's Medications  New Prescriptions   No medications on file  Previous Medications   ASPIRIN 325 MG TABLET    Take 325 mg by mouth 2 (two) times daily.   BISACODYL (  DULCOLAX) 10 MG SUPPOSITORY    Place 1 suppository (10 mg total) rectally daily as needed.   DOCUSATE SODIUM (COLACE) 100 MG CAPSULE    Take 100 mg by mouth 2 (two) times daily.   LEVOTHYROXINE (SYNTHROID, LEVOTHROID) 75 MCG TABLET    Take 75 mcg by mouth daily before breakfast.   LORAZEPAM (ATIVAN) 0.5 MG TABLET    0.5 mg every 4 (four) hours as needed.    MEMANTINE HCL ER (NAMENDA XR) 28 MG CP24    Take by mouth.   OXYCODONE (OXY IR/ROXICODONE) 5 MG IMMEDIATE RELEASE TABLET    Take 1-2 tablets (5-10 mg total) by mouth every 3 (three) hours as needed.   POLYETHYLENE GLYCOL (MIRALAX / GLYCOLAX) PACKET    Take 17 g by mouth daily as needed.   SENNA (SENOKOT) 8.6 MG TABLET    Take 2 tablets by mouth daily.    SERTRALINE (ZOLOFT) 25 MG TABLET    Take 75 mg by mouth daily.   TAMSULOSIN (FLOMAX) 0.4 MG CAPS CAPSULE    Take 1 capsule (0.4 mg total) by mouth daily.  Modified Medications   No medications on file  Discontinued Medications   No medications on file     Physical Exam: Physical Exam  Constitutional: He is oriented to person, place, and time. He appears well-developed and  well-nourished. No distress.  generalized weakness. No longer transferring self and ambulates with walker.   HENT:  Head: Normocephalic and atraumatic.  Right Ear: External ear normal.  Left Ear: External ear normal.  Nose: Nose normal.  Mouth/Throat: Oropharynx is clear and moist. No oropharyngeal exudate.  Eyes: Conjunctivae and EOM are normal. Pupils are equal, round, and reactive to light. Right eye exhibits no discharge. Left eye exhibits no discharge. No scleral icterus.  Neck: Normal range of motion. Neck supple. No JVD present. No tracheal deviation present. No thyromegaly present.  Cardiovascular: Normal rate, regular rhythm, normal heart sounds and intact distal pulses.   No murmur heard. Pulmonary/Chest: Effort normal. No stridor. No respiratory distress. He has no wheezes. He has rales. He exhibits no tenderness.  moist rales posterior lower lungs   Abdominal: Soft. Bowel sounds are normal. He exhibits no distension. There is no tenderness. There is no rebound and no guarding.  Genitourinary: Penis normal.  Musculoskeletal: Normal range of motion. He exhibits edema and tenderness.   Left knee mild swelling and warmth with limited flexion and extension ROM. LLE edema 1+ and trace edema RLE. C/o R+L knee pain.   Lymphadenopathy:    He has no cervical adenopathy.  Neurological: He is alert and oriented to person, place, and time. He has normal reflexes. No cranial nerve deficit. He exhibits normal muscle tone. Coordination abnormal.  Decreased coordination of the RUE-improving.   Skin: Skin is warm and dry. No rash noted. He is not diaphoretic. There is erythema. No pallor.  The left knee surgical incision is healed. Mild fever and erythema still presents.    Psychiatric: His speech is normal. Judgment and thought content normal. His mood appears anxious. He is slowed and withdrawn. Cognition and memory are impaired. He exhibits abnormal recent memory.  Flat affect-improved.      Filed Vitals:   09/18/14 1032  BP: 116/70  Pulse: 60  Temp: 97.6 F (36.4 C)  TempSrc: Tympanic  Resp: 18      Labs reviewed: Basic Metabolic Panel:  Recent Labs  10/29/13 12/01/13 06/04/14 07/30/14  NA 138 141 140  --  K 3.9 4.3 4.1  --   BUN 19 29* 27*  --   CREATININE 0.7 0.7 0.8  --   TSH  --   --  5.53 1.50   Liver Function Tests:  Recent Labs  10/29/13 12/01/13  AST 14 13*  ALT 9* 9*  ALKPHOS 98 149*   CBC:  Recent Labs  10/13/13 10/29/13 12/01/13  WBC 8.0 11.7 10.8  HGB 14.6 12.8* 11.5*  HCT 43 38* 34*  PLT 192 199 327   Lipid Panel: No results for input(s): CHOL, HDL, LDLCALC, TRIG, CHOLHDL, LDLDIRECT in the last 8760 hours.  Past Procedures:  10/28/13 CXR minimal cardiomegaly wihtout pulmonary vascular congestion, no pleural effusion, patchy bibasilar pneumonitis.   12/02/13 CT with and without contrast: atrophy and chronic left external capsule infarct. No acute abnormality.   Assessment/Plan Wheezes Staff noted expiratory wheezes prior getting OOB-resolved, completed Neb x3 days, continued with Furosemide.  05/29/14 CXR minimal cardiomegaly and central pulmonary vascular congestion, without other overt congestive heart failure findings. Minimal left basilar subsegmental atelectatic change, but otherwise negative for suspected focal pneumonia.  07/31/14 no wheezes noted.  08/24/14 no wheezes 12/17.15 no wheezes   Unspecified constipation Stable with Senna II qd, MiraLax dailly, Colace 100mg  bid,  and prn Bisacodyl suppository prn   Situational depression 06/16/14 dc Ativan prn not used within the previous 90 days.  Hx of anxiety and Ativan use. Better with Sertraline 75mg   and Depakote 250mg  daily 09/18/14 stable in mood.     PAD (peripheral artery disease) Per arterial US 07/04/13 diminished flow especially in the left common femoral artery and deep femoral artery areas. Risk reduction.      Osteoarthritis of left knee 04/20/14  Ortho: needs 5Ibs on ankle and let his leg extend with the weight  S/p total left joint replacement surgery.  Contracted L knee 05/29/14 c/o both knee pain with weight bearing. 05/29/14 X-ray L knee/hip: no acute osseous abnormality, chronic and degenerative findings, s/p left total knee arthroplasty.       Hypothyroidism TSH 1.761 12/01/13 06/04/14 TSH 5.533-up Levothyroxine to 19mcg 07/31/14 TSH 1.502 continue Levothyroxine 81mcg daily.     Edema Trace edema, continue Furosemide 20mg  daily, mild DOE occasionally,  06/04/14 Bun/creat 27/0.8    CVA, old, cognitive deficits slow mentation with confusion and body movements. CT head 12/02/13 showed chronic left external capsule infarct. Continue SNF for care.  07/14/14 reduce ASA from 325mg  bid to qd per consultant pharmacist recommendation.     BPH (benign prostatic hyperplasia) Takes Tamsulosin. No urinary retention.     Abdominal discomfort, bilateral lower quadrant C/o tingling sensation across lower abd-denied pain or dysuria, will UA C/S, CBC, and CMP in am. Continue to observe the patient.     Family/ Staff Communication: observe the patient.   Goals of Care: SNF  Labs/tests ordered: UA C/S, CMP, CBC in am

## 2014-09-18 NOTE — Assessment & Plan Note (Addendum)
C/o tingling sensation across lower abd-denied pain or dysuria, will UA C/S, CBC, and CMP in am. Continue to observe the patient.

## 2014-09-18 NOTE — Assessment & Plan Note (Signed)
Staff noted expiratory wheezes prior getting OOB-resolved, completed Neb x3 days, continued with Furosemide.  05/29/14 CXR minimal cardiomegaly and central pulmonary vascular congestion, without other overt congestive heart failure findings. Minimal left basilar subsegmental atelectatic change, but otherwise negative for suspected focal pneumonia.  07/31/14 no wheezes noted.  08/24/14 no wheezes 12/17.15 no wheezes

## 2014-09-21 LAB — CBC AND DIFFERENTIAL
HCT: 48 % (ref 41–53)
Hemoglobin: 13 g/dL — AB (ref 13.5–17.5)
Platelets: 186 10*3/uL (ref 150–399)
WBC: 7.2 10^3/mL

## 2014-09-21 LAB — HEPATIC FUNCTION PANEL
ALK PHOS: 121 U/L (ref 25–125)
ALT: 12 U/L (ref 10–40)
AST: 17 U/L (ref 14–40)
Bilirubin, Total: 0.6 mg/dL

## 2014-09-21 LAB — BASIC METABOLIC PANEL
BUN: 36 mg/dL — AB (ref 4–21)
CREATININE: 1 mg/dL (ref 0.6–1.3)
Glucose: 89 mg/dL
POTASSIUM: 4 mmol/L (ref 3.4–5.3)
Sodium: 142 mmol/L (ref 137–147)

## 2014-09-22 ENCOUNTER — Other Ambulatory Visit: Payer: Self-pay | Admitting: Nurse Practitioner

## 2014-09-22 DIAGNOSIS — N390 Urinary tract infection, site not specified: Secondary | ICD-10-CM

## 2014-10-16 ENCOUNTER — Non-Acute Institutional Stay (SKILLED_NURSING_FACILITY): Payer: Medicare Other | Admitting: Nurse Practitioner

## 2014-10-16 DIAGNOSIS — N400 Enlarged prostate without lower urinary tract symptoms: Secondary | ICD-10-CM

## 2014-10-16 DIAGNOSIS — K5900 Constipation, unspecified: Secondary | ICD-10-CM

## 2014-10-16 DIAGNOSIS — F4321 Adjustment disorder with depressed mood: Secondary | ICD-10-CM

## 2014-10-16 NOTE — Progress Notes (Signed)
Patient ID: William Roach, male   DOB: 1920/10/06, 81 y.o.   MRN: [id number]   Code Status: DNR  No Known Allergies  Chief Complaint  Patient presents with  . Medical Management of Chronic Issues    HPI: Patient is a 81 y.o. male seen in the SNF at [facility] today for evaluation depression and chronic medical conditions.  Problem List Items Addressed This Visit    Situational depression - Primary    No behaviors noted and mood seems good-Pharm GDR decreasing Sertraline to 50mg  po qd and continue Depakote 250mg        Constipation    Stable with Senna II qd, MiraLax dailly, Colace 100mg  bid,  and prn Bisacodyl suppository prn        BPH (benign prostatic hyperplasia)    Takes Tamsulosin. No urinary retention.           Review of Systems:  Review of Systems  Constitutional: Negative for fever, chills, weight loss, malaise/fatigue and diaphoresis.  HENT: Positive for hearing loss. Negative for congestion, ear discharge, ear pain, nosebleeds, sore throat and tinnitus.   Eyes: Negative for blurred vision, double vision, photophobia, pain, discharge and redness.  Respiratory: Negative for cough, hemoptysis, sputum production, shortness of breath and stridor.   Cardiovascular: Positive for leg swelling. Negative for chest pain, palpitations, claudication and PND.       Mild left knee  Gastrointestinal: Negative for heartburn, nausea, vomiting, abdominal pain, diarrhea, constipation, blood in stool and melena.  Genitourinary: Positive for frequency. Negative for dysuria, urgency, hematuria and flank pain.  Musculoskeletal: Positive for joint pain. Negative for myalgias, back pain, falls and neck pain.       Left knee pain-chronic. Joints stiffness.   Skin: Negative for itching and rash.       Mild erythema left knee since the surgery  Neurological: Positive for tingling and sensory change. Negative for dizziness, tremors, speech change, focal weakness, seizures, weakness  and headaches.  Endo/Heme/Allergies: Negative for environmental allergies and polydipsia. Does not bruise/bleed easily.  Psychiatric/Behavioral: Positive for memory loss. Negative for depression, suicidal ideas, hallucinations and substance abuse. The patient is not nervous/anxious and does not have insomnia.      Past Medical History  Diagnosis Date  . Hypercholesterolemia   . Fatigue   . Basal cell carcinoma, ear     post -- partial pinnectomy left side with primary closure    . History of kidney stones     x1  . Acute blood loss anemia 06/30/2013  . Adjustment disorder with mixed anxiety and depressed mood 07/04/2013  . CAD (coronary artery disease) 07/04/2011    S/p CABG   . CVA, old, cognitive deficits 12/05/2013  . Dizziness and giddiness 10/07/2013  . Gait difficulty 02/17/2014  . HTN (hypertension) 07/04/2011  . Hyperlipidemia 07/04/2011  . Lack of coordination 11/08/2013  . Memory deficit 07/29/2013  . Numbness in both legs 10/31/2013  . Osteoarthritis of left knee 06/27/2013  . PAD (peripheral artery disease) 07/29/2013  . S/P knee replacement 07/01/2013  . Situational depression 10/17/2013  . Unspecified constipation 07/01/2013  . Unspecified hypothyroidism 07/01/2013     TSH 1.761 12/01/13     . Weakness 12/01/2013   Past Surgical History  Procedure Laterality Date  . Coronary artery bypass graft  2003    x4 --   . Cholecystectomy  2002  . Inguinal hernia repair  09/19/2007  . Ear cyst excision  12/14/2009    Excision of left  auricular cyst with primary closure  . Basal cell carcinoma excision  01/14/2010    Wedge resection, partial pinnectomy left side with primary closure      . Tonsillectomy    . Carpal tunnel release    . Cataract surgery Bilateral 06-24-13  . Tonsillectomy    . Cystoscopy    . Total knee arthroplasty Left 06/27/2013    Procedure: LEFT TOTAL KNEE ARTHROPLASTY;  Surgeon: Tobi Bastos, MD;  Location: [facility];  Service: Orthopedics;  Laterality: Left;    Social History:   reports that he quit smoking about 33 years ago. He does not have any smokeless tobacco history on file. He reports that he does not drink alcohol or use illicit drugs.  Family History  Problem Relation Age of Onset  . Parkinsonism Father   . Angina Mother   . Coronary artery disease Brother     with CABG    Medications: Patient's Medications  New Prescriptions   No medications on file  Previous Medications   ASPIRIN 325 MG TABLET    Take 325 mg by mouth 2 (two) times daily.   BISACODYL (DULCOLAX) 10 MG SUPPOSITORY    Place 1 suppository (10 mg total) rectally daily as needed.   DOCUSATE SODIUM (COLACE) 100 MG CAPSULE    Take 100 mg by mouth 2 (two) times daily.   LEVOTHYROXINE (SYNTHROID, LEVOTHROID) 75 MCG TABLET    Take 75 mcg by mouth daily before breakfast.   LORAZEPAM (ATIVAN) 0.5 MG TABLET    0.5 mg every 4 (four) hours as needed.    MEMANTINE HCL ER (NAMENDA XR) 28 MG CP24    Take by mouth.   OXYCODONE (OXY IR/ROXICODONE) 5 MG IMMEDIATE RELEASE TABLET    Take 1-2 tablets (5-10 mg total) by mouth every 3 (three) hours as needed.   POLYETHYLENE GLYCOL (MIRALAX / GLYCOLAX) PACKET    Take 17 g by mouth daily as needed.   SENNA (SENOKOT) 8.6 MG TABLET    Take 2 tablets by mouth daily.    SERTRALINE (ZOLOFT) 25 MG TABLET    Take 50 mg by mouth daily.    TAMSULOSIN (FLOMAX) 0.4 MG CAPS CAPSULE    Take 1 capsule (0.4 mg total) by mouth daily.  Modified Medications   No medications on file  Discontinued Medications   No medications on file     Physical Exam: Physical Exam  Constitutional: He is oriented to person, place, and time. He appears well-developed and well-nourished. No distress.  generalized weakness. No longer transferring self and ambulates with walker.   HENT:  Head: Normocephalic and atraumatic.  Right Ear: External ear normal.  Left Ear: External ear normal.  Nose: Nose normal.  Mouth/Throat: Oropharynx is clear and moist. No  oropharyngeal exudate.  Eyes: Conjunctivae and EOM are normal. Pupils are equal, round, and reactive to light. Right eye exhibits no discharge. Left eye exhibits no discharge. No scleral icterus.  Neck: Normal range of motion. Neck supple. No JVD present. No tracheal deviation present. No thyromegaly present.  Cardiovascular: Normal rate, regular rhythm, normal heart sounds and intact distal pulses.   No murmur heard. Pulmonary/Chest: Effort normal. No stridor. No respiratory distress. He has no wheezes. He has rales. He exhibits no tenderness.  dry rales posterior lower lungs   Abdominal: Soft. Bowel sounds are normal. He exhibits no distension. There is no tenderness. There is no rebound and no guarding.  Genitourinary: Penis normal.  Musculoskeletal: Normal range of motion. He exhibits  edema and tenderness.   Left knee mild swelling and warmth with limited flexion and extension ROM. BLE trace edema. C/o R+L knee pain.   Lymphadenopathy:    He has no cervical adenopathy.  Neurological: He is alert and oriented to person, place, and time. He has normal reflexes. No cranial nerve deficit. He exhibits normal muscle tone. Coordination abnormal.  Decreased coordination of the RUE-improving.   Skin: Skin is warm and dry. No rash noted. He is not diaphoretic. There is erythema. No pallor.  The left knee surgical incision is healed. Mild fever and erythema still presents.    Psychiatric: His speech is normal. Judgment and thought content normal. His mood appears anxious. He is slowed and withdrawn. Cognition and memory are impaired. He exhibits abnormal recent memory.  Flat affect-improved.     Filed Vitals:   10/16/14 1401  BP: 124/63  Pulse: 61  Temp: 97.4 F (36.3 C)  TempSrc: Tympanic  Resp: 19      Labs reviewed: Basic Metabolic Panel:  Recent Labs  12/01/13 06/04/14 07/30/14 09/21/14  NA 141 140  --  142  K 4.3 4.1  --  4.0  BUN 29* 27*  --  36*  CREATININE 0.7 0.8  --  1.0   TSH  --  5.53 1.50  --    Liver Function Tests:  Recent Labs  10/29/13 12/01/13 09/21/14  AST 14 13* 17  ALT 9* 9* 12  ALKPHOS 98 149* 121   CBC:  Recent Labs  10/29/13 12/01/13 09/21/14  WBC 11.7 10.8 7.2  HGB 12.8* 11.5* 13.0*  HCT 38* 34* 48  PLT 199 327 186   Lipid Panel: No results for input(s): CHOL, HDL, LDLCALC, TRIG, CHOLHDL, LDLDIRECT in the last 8760 hours.  Past Procedures:  10/28/13 CXR minimal cardiomegaly wihtout pulmonary vascular congestion, no pleural effusion, patchy bibasilar pneumonitis.   12/02/13 CT with and without contrast: atrophy and chronic left external capsule infarct. No acute abnormality.   Assessment/Plan Situational depression No behaviors noted and mood seems good-Pharm GDR decreasing Sertraline to 50mg  po qd and continue Depakote 250mg     Constipation Stable with Senna II qd, MiraLax dailly, Colace 100mg  bid,  and prn Bisacodyl suppository prn     BPH (benign prostatic hyperplasia) Takes Tamsulosin. No urinary retention.       Family/ Staff Communication: observe the patient.   Goals of Care: SNF  Labs/tests ordered: none

## 2014-10-16 NOTE — Assessment & Plan Note (Signed)
No behaviors noted and mood seems good-Pharm GDR decreasing Sertraline to 50mg  po qd and continue Depakote 250mg 

## 2014-10-16 NOTE — Assessment & Plan Note (Signed)
Takes Tamsulosin. No urinary retention.

## 2014-10-16 NOTE — Assessment & Plan Note (Signed)
Stable with Senna II qd, MiraLax dailly, Colace 100mg  bid,  and prn Bisacodyl suppository prn

## 2014-11-03 ENCOUNTER — Encounter: Payer: Self-pay | Admitting: Nurse Practitioner

## 2014-11-03 ENCOUNTER — Non-Acute Institutional Stay (SKILLED_NURSING_FACILITY): Payer: Medicare Other | Admitting: Nurse Practitioner

## 2014-11-03 DIAGNOSIS — R319 Hematuria, unspecified: Secondary | ICD-10-CM

## 2014-11-03 DIAGNOSIS — I10 Essential (primary) hypertension: Secondary | ICD-10-CM

## 2014-11-03 DIAGNOSIS — R062 Wheezing: Secondary | ICD-10-CM

## 2014-11-03 DIAGNOSIS — R413 Other amnesia: Secondary | ICD-10-CM

## 2014-11-03 DIAGNOSIS — K5900 Constipation, unspecified: Secondary | ICD-10-CM

## 2014-11-03 DIAGNOSIS — E039 Hypothyroidism, unspecified: Secondary | ICD-10-CM

## 2014-11-03 DIAGNOSIS — F4323 Adjustment disorder with mixed anxiety and depressed mood: Secondary | ICD-10-CM

## 2014-11-03 DIAGNOSIS — N400 Enlarged prostate without lower urinary tract symptoms: Secondary | ICD-10-CM

## 2014-11-03 DIAGNOSIS — R609 Edema, unspecified: Secondary | ICD-10-CM

## 2014-11-03 DIAGNOSIS — N390 Urinary tract infection, site not specified: Secondary | ICD-10-CM

## 2014-11-03 NOTE — Progress Notes (Signed)
Patient ID: William Roach, male   DOB: 1921/03/10, 81 y.o.   MRN: [id number]   Code Status: DNR  No Known Allergies  Chief Complaint  Patient presents with  . Medical Management of Chronic Issues  . Acute Visit    lethargy and odorous urine.     HPI: Patient is a 81 y.o. male seen in the SNF at [facility] today for evaluation lethargy, odorous urine, suprapubic discomfort,  and chronic medical conditions.  Problem List Items Addressed This Visit    Wheezes    Resolved since the patient is well diuresed.       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 Namenda started and tolerated.       Infection of urinary tract - Primary    09/22/14 urine culture: P. Mirabilis >100,000c/ml, 10 day course of Cipro 500mg  bid started 09/21/14 11/03/14 odorous and blood tinged urine, suprapubic area discomfort-difficulty Catheterizing due to enlarged prostate and he is incontinent presently, will obtain Urine specimen is possible. Empirical ABX-Cipro 500mg  bid x 10day. CBC and BMP in am.        Hypothyroidism    TSH 1.761 12/01/13 06/04/14 TSH 5.533-up Levothyroxine to 80mcg 07/31/14 TSH 1.502 continue Levothyroxine 51mcg daily.         HTN (hypertension)    Controlled.       Edema    Trace edema, continue Furosemide 20mg  daily, mild DOE occasionally,  06/04/14 Bun/creat 27/0.8 11/03/14 edema and DOE are not apparent, will decrease Furosemide to 10mg  daily.        Constipation    Stable with Senna II qd, MiraLax dailly, Colace 100mg  bid,  and prn Bisacodyl suppository prn        BPH (benign prostatic hyperplasia)    Takes Tamsulosin. No urinary retention.         Adjustment disorder with mixed anxiety and depressed mood    No behaviors noted and mood seems good-Pharm GDR decreasing Sertraline to 50mg  po qd and continue Depakote 250mg   11/03/14 staff reported the the patient has exhibited symptoms of relapsed depression-may consider increase  Zoloft after UTI is fully treated.           Review of Systems:  Review of Systems  Constitutional: Positive for malaise/fatigue. Negative for fever, chills, weight loss and diaphoresis.  HENT: Positive for hearing loss. Negative for congestion, ear discharge, ear pain, nosebleeds, sore throat and tinnitus.   Eyes: Negative for blurred vision, double vision, photophobia, pain, discharge and redness.  Respiratory: Negative for cough, hemoptysis, sputum production, shortness of breath, wheezing and stridor.   Cardiovascular: Positive for leg swelling. Negative for chest pain, palpitations, orthopnea, claudication and PND.       Not apparent  Gastrointestinal: Positive for abdominal pain. Negative for heartburn, nausea, vomiting, diarrhea, constipation, blood in stool and melena.       Suprapubic discomfort.   Genitourinary: Positive for urgency, frequency and hematuria. Negative for dysuria and flank pain.  Musculoskeletal: Positive for joint pain. Negative for myalgias, back pain, falls and neck pain.       Knees  Skin: Negative for itching and rash.  Neurological: Negative for dizziness, tingling, tremors, sensory change, speech change, focal weakness, seizures, loss of consciousness, weakness and headaches.  Endo/Heme/Allergies: Negative for environmental allergies and polydipsia. Does not bruise/bleed easily.  Psychiatric/Behavioral: Positive for depression and memory loss. Negative for suicidal ideas, hallucinations and substance abuse. The patient is not nervous/anxious and does not  have insomnia.      Past Medical History  Diagnosis Date  . Hypercholesterolemia   . Fatigue   . Basal cell carcinoma, ear     post -- partial pinnectomy left side with primary closure    . History of kidney stones     x1  . Acute blood loss anemia 06/30/2013  . Adjustment disorder with mixed anxiety and depressed mood 07/04/2013  . CAD (coronary artery disease) 07/04/2011    S/p CABG   . CVA, old,  cognitive deficits 12/05/2013  . Dizziness and giddiness 10/07/2013  . Gait difficulty 02/17/2014  . HTN (hypertension) 07/04/2011  . Hyperlipidemia 07/04/2011  . Lack of coordination 11/08/2013  . Memory deficit 07/29/2013  . Numbness in both legs 10/31/2013  . Osteoarthritis of left knee 06/27/2013  . PAD (peripheral artery disease) 07/29/2013  . S/P knee replacement 07/01/2013  . Situational depression 10/17/2013  . Unspecified constipation 07/01/2013  . Unspecified hypothyroidism 07/01/2013     TSH 1.761 12/01/13     . Weakness 12/01/2013   Past Surgical History  Procedure Laterality Date  . Coronary artery bypass graft  2003    x4 --   . Cholecystectomy  2002  . Inguinal hernia repair  09/19/2007  . Ear cyst excision  12/14/2009    Excision of left auricular cyst with primary closure  . Basal cell carcinoma excision  01/14/2010    Wedge resection, partial pinnectomy left side with primary closure      . Tonsillectomy    . Carpal tunnel release    . Cataract surgery Bilateral 06-24-13  . Tonsillectomy    . Cystoscopy    . Total knee arthroplasty Left 06/27/2013    Procedure: LEFT TOTAL KNEE ARTHROPLASTY;  Surgeon: Tobi Bastos, MD;  Location: [facility];  Service: Orthopedics;  Laterality: Left;   Social History:   reports that he quit smoking about 33 years ago. He does not have any smokeless tobacco history on file. He reports that he does not drink alcohol or use illicit drugs.  Family History  Problem Relation Age of Onset  . Parkinsonism Father   . Angina Mother   . Coronary artery disease Brother     with CABG    Medications: Patient's Medications  New Prescriptions   No medications on file  Previous Medications   ASPIRIN 325 MG TABLET    Take 325 mg by mouth 2 (two) times daily.   BISACODYL (DULCOLAX) 10 MG SUPPOSITORY    Place 1 suppository (10 mg total) rectally daily as needed.   DOCUSATE SODIUM (COLACE) 100 MG CAPSULE    Take 100 mg by mouth 2 (two) times daily.    LEVOTHYROXINE (SYNTHROID, LEVOTHROID) 75 MCG TABLET    Take 75 mcg by mouth daily before breakfast.   LORAZEPAM (ATIVAN) 0.5 MG TABLET    0.5 mg every 4 (four) hours as needed.    MEMANTINE HCL ER (NAMENDA XR) 28 MG CP24    Take by mouth.   OXYCODONE (OXY IR/ROXICODONE) 5 MG IMMEDIATE RELEASE TABLET    Take 1-2 tablets (5-10 mg total) by mouth every 3 (three) hours as needed.   POLYETHYLENE GLYCOL (MIRALAX / GLYCOLAX) PACKET    Take 17 g by mouth daily as needed.   SENNA (SENOKOT) 8.6 MG TABLET    Take 2 tablets by mouth daily.    SERTRALINE (ZOLOFT) 25 MG TABLET    Take 50 mg by mouth daily.    TAMSULOSIN (FLOMAX) 0.4 MG  CAPS CAPSULE    Take 1 capsule (0.4 mg total) by mouth daily.  Modified Medications   No medications on file  Discontinued Medications   No medications on file     Physical Exam: Physical Exam  Constitutional: He is oriented to person, place, and time. He appears well-developed and well-nourished. No distress.  generalized weakness. No longer transferring self and ambulates with walker.   HENT:  Head: Normocephalic and atraumatic.  Right Ear: External ear normal.  Left Ear: External ear normal.  Nose: Nose normal.  Mouth/Throat: Oropharynx is clear and moist. No oropharyngeal exudate.  Eyes: Conjunctivae and EOM are normal. Pupils are equal, round, and reactive to light. Right eye exhibits no discharge. Left eye exhibits no discharge. No scleral icterus.  Neck: Normal range of motion. Neck supple. No JVD present. No tracheal deviation present. No thyromegaly present.  Cardiovascular: Normal rate, regular rhythm, normal heart sounds and intact distal pulses.   No murmur heard. Pulmonary/Chest: Effort normal. No stridor. No respiratory distress. He has no wheezes. He has rales. He exhibits no tenderness.  dry rales posterior lower lungs   Abdominal: Soft. Bowel sounds are normal. He exhibits no distension. There is no tenderness. There is no rebound and no guarding.    Genitourinary: Penis normal.  Musculoskeletal: Normal range of motion. He exhibits edema and tenderness.   Left knee mild swelling and warmth with limited flexion and extension ROM. C/o R+L knee pain.   Lymphadenopathy:    He has no cervical adenopathy.  Neurological: He is alert and oriented to person, place, and time. He has normal reflexes. No cranial nerve deficit. He exhibits normal muscle tone. Coordination abnormal.  Decreased coordination of the RUE-improving.   Skin: Skin is warm and dry. No rash noted. He is not diaphoretic. There is erythema. No pallor.  The left knee surgical incision is healed. Mild fever and erythema still presents.    Psychiatric: His speech is normal. Judgment and thought content normal. His mood appears anxious. He is slowed and withdrawn. Cognition and memory are impaired. He exhibits abnormal recent memory.  Flat affect-improved.     Filed Vitals:   11/03/14 1030  BP: 124/80  Pulse: 70  Temp: 97.1 F (36.2 C)  TempSrc: Tympanic  Resp: 18      Labs reviewed: Basic Metabolic Panel:  Recent Labs  12/01/13 06/04/14 07/30/14 09/21/14  NA 141 140  --  142  K 4.3 4.1  --  4.0  BUN 29* 27*  --  36*  CREATININE 0.7 0.8  --  1.0  TSH  --  5.53 1.50  --    Liver Function Tests:  Recent Labs  12/01/13 09/21/14  AST 13* 17  ALT 9* 12  ALKPHOS 149* 121   CBC:  Recent Labs  12/01/13 09/21/14  WBC 10.8 7.2  HGB 11.5* 13.0*  HCT 34* 48  PLT 327 186   Lipid Panel: No results for input(s): CHOL, HDL, LDLCALC, TRIG, CHOLHDL, LDLDIRECT in the last 8760 hours.  Past Procedures:  10/28/13 CXR minimal cardiomegaly wihtout pulmonary vascular congestion, no pleural effusion, patchy bibasilar pneumonitis.   12/02/13 CT with and without contrast: atrophy and chronic left external capsule infarct. No acute abnormality.   Assessment/Plan Infection of urinary tract 09/22/14 urine culture: P. Mirabilis >100,000c/ml, 10 day course of Cipro 500mg  bid  started 09/21/14 11/03/14 odorous and blood tinged urine, suprapubic area discomfort-difficulty Catheterizing due to enlarged prostate and he is incontinent presently, will obtain Urine specimen is  possible. Empirical ABX-Cipro 500mg  bid x 10day. CBC and BMP in am.     Edema Trace edema, continue Furosemide 20mg  daily, mild DOE occasionally,  06/04/14 Bun/creat 27/0.8 11/03/14 edema and DOE are not apparent, will decrease Furosemide to 10mg  daily.     Adjustment disorder with mixed anxiety and depressed mood No behaviors noted and mood seems good-Pharm GDR decreasing Sertraline to 50mg  po qd and continue Depakote 250mg   11/03/14 staff reported the the patient has exhibited symptoms of relapsed depression-may consider increase Zoloft after UTI is fully treated.     BPH (benign prostatic hyperplasia) Takes Tamsulosin. No urinary retention.      Hypothyroidism TSH 1.761 12/01/13 06/04/14 TSH 5.533-up Levothyroxine to 17mcg 07/31/14 TSH 1.502 continue Levothyroxine 13mcg daily.      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 Namenda started and tolerated.    Wheezes Resolved since the patient is well diuresed.    HTN (hypertension) Controlled.      Family/ Staff Communication: observe the patient.   Goals of Care: SNF  Labs/tests ordered: CBC, BMP, UA C/S if possible.

## 2014-11-03 NOTE — Assessment & Plan Note (Signed)
Takes Tamsulosin. No urinary retention.

## 2014-11-03 NOTE — Assessment & Plan Note (Signed)
No behaviors noted and mood seems good-Pharm GDR decreasing Sertraline to 50mg  po qd and continue Depakote 250mg   11/03/14 staff reported the the patient has exhibited symptoms of relapsed depression-may consider increase Zoloft after UTI is fully treated.

## 2014-11-03 NOTE — Assessment & Plan Note (Signed)
Resolved since the patient is well diuresed.

## 2014-11-03 NOTE — Assessment & Plan Note (Signed)
Stable with Senna II qd, MiraLax dailly, Colace 100mg  bid,  and prn Bisacodyl suppository prn

## 2014-11-03 NOTE — Assessment & Plan Note (Signed)
Trace edema, continue Furosemide 20mg  daily, mild DOE occasionally,  06/04/14 Bun/creat 27/0.8 11/03/14 edema and DOE are not apparent, will decrease Furosemide to 10mg  daily.

## 2014-11-03 NOTE — Assessment & Plan Note (Signed)
Controlled.  

## 2014-11-03 NOTE — Assessment & Plan Note (Signed)
TSH 1.761 12/01/13 06/04/14 TSH 5.533-up Levothyroxine to 40mcg 07/31/14 TSH 1.502 continue Levothyroxine 34mcg daily.

## 2014-11-03 NOTE — Assessment & Plan Note (Signed)
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 Namenda started and tolerated.

## 2014-11-03 NOTE — Assessment & Plan Note (Signed)
09/22/14 urine culture: P. Mirabilis >100,000c/ml, 10 day course of Cipro 500mg  bid started 09/21/14 11/03/14 odorous and blood tinged urine, suprapubic area discomfort-difficulty Catheterizing due to enlarged prostate and he is incontinent presently, will obtain Urine specimen is possible. Empirical ABX-Cipro 500mg  bid x 10day. CBC and BMP in am.

## 2014-11-04 LAB — CBC AND DIFFERENTIAL
HEMATOCRIT: 48 % (ref 41–53)
HEMOGLOBIN: 15.9 g/dL (ref 13.5–17.5)
Platelets: 155 10*3/uL (ref 150–399)
WBC: 8.1 10^3/mL

## 2014-11-04 LAB — BASIC METABOLIC PANEL
BUN: 27 mg/dL — AB (ref 4–21)
Creatinine: 1.3 mg/dL (ref 0.6–1.3)
GLUCOSE: 113 mg/dL
Potassium: 4.1 mmol/L (ref 3.4–5.3)
SODIUM: 140 mmol/L (ref 137–147)

## 2014-11-06 ENCOUNTER — Non-Acute Institutional Stay (SKILLED_NURSING_FACILITY): Payer: Medicare Other | Admitting: Nurse Practitioner

## 2014-11-06 DIAGNOSIS — E039 Hypothyroidism, unspecified: Secondary | ICD-10-CM

## 2014-11-06 DIAGNOSIS — N390 Urinary tract infection, site not specified: Secondary | ICD-10-CM | POA: Insufficient documentation

## 2014-11-06 DIAGNOSIS — K5900 Constipation, unspecified: Secondary | ICD-10-CM

## 2014-11-06 DIAGNOSIS — F4323 Adjustment disorder with mixed anxiety and depressed mood: Secondary | ICD-10-CM

## 2014-11-06 DIAGNOSIS — I10 Essential (primary) hypertension: Secondary | ICD-10-CM

## 2014-11-06 DIAGNOSIS — R319 Hematuria, unspecified: Secondary | ICD-10-CM

## 2014-11-06 DIAGNOSIS — R413 Other amnesia: Secondary | ICD-10-CM

## 2014-11-06 DIAGNOSIS — R634 Abnormal weight loss: Secondary | ICD-10-CM

## 2014-11-06 DIAGNOSIS — N400 Enlarged prostate without lower urinary tract symptoms: Secondary | ICD-10-CM

## 2014-11-06 DIAGNOSIS — R609 Edema, unspecified: Secondary | ICD-10-CM

## 2014-11-06 NOTE — Assessment & Plan Note (Signed)
Controlled.  

## 2014-11-06 NOTE — Progress Notes (Signed)
Patient ID: William Roach, male   DOB: December 09, 1920, 81 y.o.   MRN: [id number]   Code Status: DNR  No Known Allergies  Chief Complaint  Patient presents with  . Medical Management of Chronic Issues  . Acute Visit    loss of weight    HPI: Patient is a 81 y.o. male seen in the SNF at [facility] today for UTI, loss of weight,   and chronic medical conditions.  Problem List Items Addressed This Visit    UTI (urinary tract infection) - Primary    11/05/14 UA gram negative rods, >100,000c/ml, large leukocyte esterase, large blood, empirical Cipro 11/06/14 urine culture P. Mirabilis>100,000c/ml susceptible to Cipro.       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 Namenda started and tolerated.        Loss of weight    11/06/14 # 176 12/15, # 174 1/16, # 154 2/16. Zoloft was decreased to 50mg  10/16/14. Now increased to 75mg  11/04/14. CBC and BMP 11/04/14 unremarkable. 07/30/14 TSH 1.5. UTI is treated with Cipro. Dietary consult and observe weights. Update TSH and BMP      Hypothyroidism    TSH 1.761 12/01/13 06/04/14 TSH 5.533-up Levothyroxine to 55mcg 07/31/14 TSH 1.502 continue Levothyroxine 85mcg daily.  11/06/14 update TSH        HTN (hypertension)    Controlled.        Edema    Trace edema, continue Furosemide 20mg  daily, mild DOE occasionally,  06/04/14 Bun/creat 27/0.8 11/03/14 edema and DOE are not apparent, will decrease Furosemide to 10mg  daily.  11/06/14 no change in edema. Continue to observe. Update BMP since creatinine 1.30 11/04/14.         Constipation    Stable with Senna II qd, MiraLax dailly, Colace 100mg  bid,  and prn Bisacodyl suppository prn        BPH (benign prostatic hyperplasia)    Takes Tamsulosin. No urinary retention.         Adjustment disorder with mixed anxiety and depressed mood    No behaviors noted and mood seems good-10/16/14 Pharm GDR decreasing Sertraline to 50mg  po qd and continue Depakote 250mg   11/03/14  staff reported the the patient has exhibited symptoms of relapsed depression-may consider increase Zoloft after UTI is fully treated with Cipro.  11/04/14 Zoloft 75mg            Review of Systems:  Review of Systems  Constitutional: Positive for weight loss and malaise/fatigue. Negative for fever, chills and diaphoresis.  HENT: Positive for hearing loss. Negative for congestion, ear discharge, ear pain, nosebleeds, sore throat and tinnitus.   Eyes: Negative for blurred vision, double vision, photophobia, pain, discharge and redness.  Respiratory: Negative for cough, hemoptysis, sputum production, shortness of breath, wheezing and stridor.   Cardiovascular: Positive for leg swelling. Negative for chest pain, palpitations, orthopnea, claudication and PND.       Trace  Gastrointestinal: Negative for heartburn, nausea, vomiting, abdominal pain, diarrhea, constipation, blood in stool and melena.  Genitourinary: Positive for frequency. Negative for dysuria, urgency, hematuria and flank pain.  Musculoskeletal: Positive for joint pain. Negative for myalgias, back pain, falls and neck pain.  Skin: Negative for itching and rash.  Neurological: Negative for dizziness, tingling, tremors, sensory change, speech change, focal weakness, seizures, loss of consciousness, weakness and headaches.  Endo/Heme/Allergies: Negative for environmental allergies and polydipsia. Does not bruise/bleed easily.  Psychiatric/Behavioral: Positive for depression and memory loss. Negative for suicidal ideas,  hallucinations and substance abuse. The patient is nervous/anxious. The patient does not have insomnia.      Past Medical History  Diagnosis Date  . Hypercholesterolemia   . Fatigue   . Basal cell carcinoma, ear     post -- partial pinnectomy left side with primary closure    . History of kidney stones     x1  . Acute blood loss anemia 06/30/2013  . Adjustment disorder with mixed anxiety and depressed mood  07/04/2013  . CAD (coronary artery disease) 07/04/2011    S/p CABG   . CVA, old, cognitive deficits 12/05/2013  . Dizziness and giddiness 10/07/2013  . Gait difficulty 02/17/2014  . HTN (hypertension) 07/04/2011  . Hyperlipidemia 07/04/2011  . Lack of coordination 11/08/2013  . Memory deficit 07/29/2013  . Numbness in both legs 10/31/2013  . Osteoarthritis of left knee 06/27/2013  . PAD (peripheral artery disease) 07/29/2013  . S/P knee replacement 07/01/2013  . Situational depression 10/17/2013  . Unspecified constipation 07/01/2013  . Unspecified hypothyroidism 07/01/2013     TSH 1.761 12/01/13     . Weakness 12/01/2013   Past Surgical History  Procedure Laterality Date  . Coronary artery bypass graft  2003    x4 --   . Cholecystectomy  2002  . Inguinal hernia repair  09/19/2007  . Ear cyst excision  12/14/2009    Excision of left auricular cyst with primary closure  . Basal cell carcinoma excision  01/14/2010    Wedge resection, partial pinnectomy left side with primary closure      . Tonsillectomy    . Carpal tunnel release    . Cataract surgery Bilateral 06-24-13  . Tonsillectomy    . Cystoscopy    . Total knee arthroplasty Left 06/27/2013    Procedure: LEFT TOTAL KNEE ARTHROPLASTY;  Surgeon: Tobi Bastos, MD;  Location: [facility];  Service: Orthopedics;  Laterality: Left;   Social History:   reports that he quit smoking about 33 years ago. He does not have any smokeless tobacco history on file. He reports that he does not drink alcohol or use illicit drugs.  Family History  Problem Relation Age of Onset  . Parkinsonism Father   . Angina Mother   . Coronary artery disease Brother     with CABG    Medications: Patient's Medications  New Prescriptions   No medications on file  Previous Medications   ASPIRIN 325 MG TABLET    Take 325 mg by mouth 2 (two) times daily.   BISACODYL (DULCOLAX) 10 MG SUPPOSITORY    Place 1 suppository (10 mg total) rectally daily as needed.   DOCUSATE  SODIUM (COLACE) 100 MG CAPSULE    Take 100 mg by mouth 2 (two) times daily.   LEVOTHYROXINE (SYNTHROID, LEVOTHROID) 75 MCG TABLET    Take 75 mcg by mouth daily before breakfast.   LORAZEPAM (ATIVAN) 0.5 MG TABLET    0.5 mg every 4 (four) hours as needed.    MEMANTINE HCL ER (NAMENDA XR) 28 MG CP24    Take by mouth.   OXYCODONE (OXY IR/ROXICODONE) 5 MG IMMEDIATE RELEASE TABLET    Take 1-2 tablets (5-10 mg total) by mouth every 3 (three) hours as needed.   POLYETHYLENE GLYCOL (MIRALAX / GLYCOLAX) PACKET    Take 17 g by mouth daily as needed.   SENNA (SENOKOT) 8.6 MG TABLET    Take 2 tablets by mouth daily.    SERTRALINE (ZOLOFT) 25 MG TABLET    Take  75 mg by mouth daily.    TAMSULOSIN (FLOMAX) 0.4 MG CAPS CAPSULE    Take 1 capsule (0.4 mg total) by mouth daily.  Modified Medications   No medications on file  Discontinued Medications   No medications on file     Physical Exam: Physical Exam  Constitutional: He is oriented to person, place, and time. He appears well-developed and well-nourished. No distress.  generalized weakness. No longer transferring self and ambulates with walker.   HENT:  Head: Normocephalic and atraumatic.  Right Ear: External ear normal.  Left Ear: External ear normal.  Nose: Nose normal.  Mouth/Throat: Oropharynx is clear and moist. No oropharyngeal exudate.  Eyes: Conjunctivae and EOM are normal. Pupils are equal, round, and reactive to light. Right eye exhibits no discharge. Left eye exhibits no discharge. No scleral icterus.  Neck: Normal range of motion. Neck supple. No JVD present. No tracheal deviation present. No thyromegaly present.  Cardiovascular: Normal rate, regular rhythm, normal heart sounds and intact distal pulses.   No murmur heard. Pulmonary/Chest: Effort normal. No stridor. No respiratory distress. He has no wheezes. He has rales. He exhibits no tenderness.  dry rales posterior lower lungs   Abdominal: Soft. Bowel sounds are normal. He exhibits  no distension. There is no tenderness. There is no rebound and no guarding.  Genitourinary: Penis normal.  Musculoskeletal: Normal range of motion. He exhibits edema and tenderness.   Left knee mild swelling and warmth with limited flexion and extension ROM. C/o R+L knee pain.  Only trace edema in ankles.   Lymphadenopathy:    He has no cervical adenopathy.  Neurological: He is alert and oriented to person, place, and time. He has normal reflexes. No cranial nerve deficit. He exhibits normal muscle tone. Coordination abnormal.  Decreased coordination of the RUE-improving.   Skin: Skin is warm and dry. No rash noted. He is not diaphoretic. There is erythema. No pallor.  The left knee surgical incision is healed. Mild fever and erythema still presents.    Psychiatric: His speech is normal. Judgment and thought content normal. His mood appears anxious. He is slowed and withdrawn. Cognition and memory are impaired. He exhibits abnormal recent memory.  Flat affect-improved.     Filed Vitals:   11/06/14 1435  BP: 122/78  Pulse: 72  Temp: 96.9 F (36.1 C)  TempSrc: Tympanic  Resp: 20      Labs reviewed: Basic Metabolic Panel:  Recent Labs  06/04/14 07/30/14 09/21/14 11/04/14  NA 140  --  142 140  K 4.1  --  4.0 4.1  BUN 27*  --  36* 27*  CREATININE 0.8  --  1.0 1.3  TSH 5.53 1.50  --   --    Liver Function Tests:  Recent Labs  12/01/13 09/21/14  AST 13* 17  ALT 9* 12  ALKPHOS 149* 121   CBC:  Recent Labs  12/01/13 09/21/14 11/04/14  WBC 10.8 7.2 8.1  HGB 11.5* 13.0* 15.9  HCT 34* 48 48  PLT 327 186 155   Lipid Panel: No results for input(s): CHOL, HDL, LDLCALC, TRIG, CHOLHDL, LDLDIRECT in the last 8760 hours.  Past Procedures:  10/28/13 CXR minimal cardiomegaly wihtout pulmonary vascular congestion, no pleural effusion, patchy bibasilar pneumonitis.   12/02/13 CT with and without contrast: atrophy and chronic left external capsule infarct. No acute abnormality.    Assessment/Plan Adjustment disorder with mixed anxiety and depressed mood No behaviors noted and mood seems good-10/16/14 Pharm GDR decreasing Sertraline to 50mg   po qd and continue Depakote 250mg   11/03/14 staff reported the the patient has exhibited symptoms of relapsed depression-may consider increase Zoloft after UTI is fully treated with Cipro.  11/04/14 Zoloft 75mg      Loss of weight 11/06/14 # 176 12/15, # 174 1/16, # 154 2/16. Zoloft was decreased to 50mg  10/16/14. Now increased to 75mg  11/04/14. CBC and BMP 11/04/14 unremarkable. 07/30/14 TSH 1.5. UTI is treated with Cipro. Dietary consult and observe weights. Update TSH and BMP   Hypothyroidism TSH 1.761 12/01/13 06/04/14 TSH 5.533-up Levothyroxine to 70mcg 07/31/14 TSH 1.502 continue Levothyroxine 41mcg daily.  11/06/14 update TSH     Edema Trace edema, continue Furosemide 20mg  daily, mild DOE occasionally,  06/04/14 Bun/creat 27/0.8 11/03/14 edema and DOE are not apparent, will decrease Furosemide to 10mg  daily.  11/06/14 no change in edema. Continue to observe. Update BMP since creatinine 1.30 11/04/14.      BPH (benign prostatic hyperplasia) Takes Tamsulosin. No urinary retention.      HTN (hypertension) Controlled.     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 Namenda started and tolerated.     Constipation Stable with Senna II qd, MiraLax dailly, Colace 100mg  bid,  and prn Bisacodyl suppository prn     UTI (urinary tract infection) 11/05/14 UA gram negative rods, >100,000c/ml, large leukocyte esterase, large blood, empirical Cipro 11/06/14 urine culture P. Mirabilis>100,000c/ml susceptible to Cipro.      Family/ Staff Communication: observe the patient.   Goals of Care: SNF  Labs/tests ordered: TSH, BMP

## 2014-11-06 NOTE — Assessment & Plan Note (Signed)
TSH 1.761 12/01/13 06/04/14 TSH 5.533-up Levothyroxine to 67mcg 07/31/14 TSH 1.502 continue Levothyroxine 18mcg daily.  11/06/14 update TSH

## 2014-11-06 NOTE — Assessment & Plan Note (Signed)
Takes Tamsulosin. No urinary retention.

## 2014-11-06 NOTE — Assessment & Plan Note (Signed)
No behaviors noted and mood seems good-10/16/14 Pharm GDR decreasing Sertraline to 50mg  po qd and continue Depakote 250mg   11/03/14 staff reported the the patient has exhibited symptoms of relapsed depression-may consider increase Zoloft after UTI is fully treated with Cipro.  11/04/14 Zoloft 75mg 

## 2014-11-06 NOTE — Assessment & Plan Note (Addendum)
Trace edema, continue Furosemide 20mg  daily, mild DOE occasionally,  06/04/14 Bun/creat 27/0.8 11/03/14 edema and DOE are not apparent, will decrease Furosemide to 10mg  daily.  11/06/14 no change in edema. Continue to observe. Update BMP since creatinine 1.30 11/04/14.

## 2014-11-06 NOTE — Assessment & Plan Note (Addendum)
11/06/14 # 176 12/15, # 174 1/16, # 154 2/16. Zoloft was decreased to 50mg  10/16/14. Now increased to 75mg  11/04/14. CBC and BMP 11/04/14 unremarkable. 07/30/14 TSH 1.5. UTI is treated with Cipro. Dietary consult and observe weights. Update TSH and BMP

## 2014-11-06 NOTE — Assessment & Plan Note (Signed)
Stable with Senna II qd, MiraLax dailly, Colace 100mg  bid,  and prn Bisacodyl suppository prn

## 2014-11-06 NOTE — Assessment & Plan Note (Addendum)
11/05/14 UA gram negative rods, >100,000c/ml, large leukocyte esterase, large blood, empirical Cipro 11/06/14 urine culture P. Mirabilis>100,000c/ml susceptible to Cipro.

## 2014-11-06 NOTE — Assessment & Plan Note (Signed)
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 Namenda started and tolerated.

## 2014-11-16 LAB — TSH: TSH: 1.82 u[IU]/mL (ref <=5.90)

## 2014-11-16 LAB — BASIC METABOLIC PANEL
BUN: 19 mg/dL (ref 4–21)
CREATININE: 0.9 mg/dL (ref <=1.3)
Glucose: 78 mg/dL
Potassium: 4.2 mmol/L (ref 3.4–5.3)
Sodium: 140 mmol/L (ref 137–147)

## 2014-11-17 ENCOUNTER — Other Ambulatory Visit: Payer: Self-pay | Admitting: Nurse Practitioner

## 2014-12-11 ENCOUNTER — Non-Acute Institutional Stay (SKILLED_NURSING_FACILITY): Payer: Medicare Other | Admitting: Nurse Practitioner

## 2014-12-11 ENCOUNTER — Encounter: Payer: Self-pay | Admitting: Nurse Practitioner

## 2014-12-11 DIAGNOSIS — F4323 Adjustment disorder with mixed anxiety and depressed mood: Secondary | ICD-10-CM | POA: Diagnosis not present

## 2014-12-11 DIAGNOSIS — K5900 Constipation, unspecified: Secondary | ICD-10-CM

## 2014-12-11 DIAGNOSIS — R634 Abnormal weight loss: Secondary | ICD-10-CM | POA: Diagnosis not present

## 2014-12-11 DIAGNOSIS — N400 Enlarged prostate without lower urinary tract symptoms: Secondary | ICD-10-CM

## 2014-12-11 DIAGNOSIS — E039 Hypothyroidism, unspecified: Secondary | ICD-10-CM | POA: Diagnosis not present

## 2014-12-11 DIAGNOSIS — I10 Essential (primary) hypertension: Secondary | ICD-10-CM | POA: Diagnosis not present

## 2014-12-11 DIAGNOSIS — R609 Edema, unspecified: Secondary | ICD-10-CM | POA: Diagnosis not present

## 2014-12-11 DIAGNOSIS — R413 Other amnesia: Secondary | ICD-10-CM

## 2014-12-11 DIAGNOSIS — R062 Wheezing: Secondary | ICD-10-CM

## 2014-12-11 NOTE — Assessment & Plan Note (Signed)
Controlled.  

## 2014-12-11 NOTE — Assessment & Plan Note (Signed)
Trace edema, continue Furosemide 20mg  daily, mild DOE occasionally,  06/04/14 Bun/creat 27/0.8 11/03/14 edema and DOE are not apparent, will decrease Furosemide to 10mg  daily.  11/06/14 no change in edema. Continue to observe. Update BMP since creatinine 1.30 11/04/14.  12/11/14 not apparent-improved.

## 2014-12-11 NOTE — Assessment & Plan Note (Signed)
TSH 1.761 12/01/13 06/04/14 TSH 5.533-up Levothyroxine to 32mcg 07/31/14 TSH 1.502 continue Levothyroxine 39mcg daily.  11/06/14 update TSH

## 2014-12-11 NOTE — Progress Notes (Signed)
Patient ID: William Roach, male   DOB: 22-Jun-1921, 81 y.o.   MRN: [id number]   Code Status: DNR  No Known Allergies  Chief Complaint  Patient presents with  . Medical Management of Chronic Issues    HPI: Patient is a 81 y.o. male seen in the SNF at [facility] today for evaluation of chronic medical conditions.  Problem List Items Addressed This Visit    Wheezes    Resolved since the patient is well diuresed.        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 Namenda started and tolerated.         Loss of weight    11/06/14 # 176 12/15, # 174 1/16, # 154 2/16. Zoloft was decreased to 50mg  10/16/14. Now increased to 75mg  11/04/14. CBC and BMP 11/04/14 unremarkable. 07/30/14 TSH 1.5. UTI is treated with Cipro. Dietary consult and observe weights. Update TSH and BMP 12/11/14 continue to monitor weight and dietary supplement.       Hypothyroidism    TSH 1.761 12/01/13 06/04/14 TSH 5.533-up Levothyroxine to 52mcg 07/31/14 TSH 1.502 continue Levothyroxine 74mcg daily.  11/06/14 update TSH      HTN (hypertension)    Controlled.        Edema - Primary    Trace edema, continue Furosemide 20mg  daily, mild DOE occasionally,  06/04/14 Bun/creat 27/0.8 11/03/14 edema and DOE are not apparent, will decrease Furosemide to 10mg  daily.  11/06/14 no change in edema. Continue to observe. Update BMP since creatinine 1.30 11/04/14.  12/11/14 not apparent-improved.         Constipation    Stable with Senna II qd, MiraLax dailly, Colace 100mg  bid,  and prn Bisacodyl suppository prn        BPH (benign prostatic hyperplasia)    Takes Tamsulosin. No urinary retention.         Adjustment disorder with mixed anxiety and depressed mood    No behaviors noted and mood seems good-10/16/14 Pharm GDR decreasing Sertraline to 50mg  po qd and continue Depakote 250mg   11/03/14 staff reported the the patient has exhibited symptoms of relapsed depression-may consider increase  Zoloft after UTI is fully treated with Cipro.  11/04/14 Zoloft 75mg  12/11/14 mood is stable.            Review of Systems:  Review of Systems  Constitutional: Negative for fever, chills and diaphoresis.  HENT: Positive for hearing loss. Negative for congestion, ear discharge, ear pain, nosebleeds, sore throat and tinnitus.   Eyes: Negative for photophobia, pain, discharge and redness.  Respiratory: Negative for cough, shortness of breath, wheezing and stridor.   Cardiovascular: Positive for leg swelling. Negative for chest pain and palpitations.       Trace  Gastrointestinal: Negative for nausea, vomiting, abdominal pain, diarrhea, constipation and blood in stool.  Endocrine: Negative for polydipsia.  Genitourinary: Positive for frequency. Negative for dysuria, urgency, hematuria and flank pain.  Musculoskeletal: Negative for myalgias, back pain and neck pain.  Skin: Negative for rash.  Allergic/Immunologic: Negative for environmental allergies.  Neurological: Negative for dizziness, tremors, seizures, weakness and headaches.  Hematological: Does not bruise/bleed easily.  Psychiatric/Behavioral: Negative for suicidal ideas and hallucinations. The patient is nervous/anxious.      Past Medical History  Diagnosis Date  . Hypercholesterolemia   . Fatigue   . Basal cell carcinoma, ear     post -- partial pinnectomy left side with primary closure    . History of  kidney stones     x1  . Acute blood loss anemia 06/30/2013  . Adjustment disorder with mixed anxiety and depressed mood 07/04/2013  . CAD (coronary artery disease) 07/04/2011    S/p CABG   . CVA, old, cognitive deficits 12/05/2013  . Dizziness and giddiness 10/07/2013  . Gait difficulty 02/17/2014  . HTN (hypertension) 07/04/2011  . Hyperlipidemia 07/04/2011  . Lack of coordination 11/08/2013  . Memory deficit 07/29/2013  . Numbness in both legs 10/31/2013  . Osteoarthritis of left knee 06/27/2013  . PAD (peripheral artery  disease) 07/29/2013  . S/P knee replacement 07/01/2013  . Situational depression 10/17/2013  . Unspecified constipation 07/01/2013  . Unspecified hypothyroidism 07/01/2013     TSH 1.761 12/01/13     . Weakness 12/01/2013   Past Surgical History  Procedure Laterality Date  . Coronary artery bypass graft  2003    x4 --   . Cholecystectomy  2002  . Inguinal hernia repair  09/19/2007  . Ear cyst excision  12/14/2009    Excision of left auricular cyst with primary closure  . Basal cell carcinoma excision  01/14/2010    Wedge resection, partial pinnectomy left side with primary closure      . Tonsillectomy    . Carpal tunnel release    . Cataract surgery Bilateral 06-24-13  . Tonsillectomy    . Cystoscopy    . Total knee arthroplasty Left 06/27/2013    Procedure: LEFT TOTAL KNEE ARTHROPLASTY;  Surgeon: Tobi Bastos, MD;  Location: [facility];  Service: Orthopedics;  Laterality: Left;   Social History:   reports that he quit smoking about 33 years ago. He does not have any smokeless tobacco history on file. He reports that he does not drink alcohol or use illicit drugs.  Family History  Problem Relation Age of Onset  . Parkinsonism Father   . Angina Mother   . Coronary artery disease Brother     with CABG    Medications: Patient's Medications  New Prescriptions   No medications on file  Previous Medications   ASPIRIN 325 MG TABLET    Take 325 mg by mouth 2 (two) times daily.   BISACODYL (DULCOLAX) 10 MG SUPPOSITORY    Place 1 suppository (10 mg total) rectally daily as needed.   DOCUSATE SODIUM (COLACE) 100 MG CAPSULE    Take 100 mg by mouth 2 (two) times daily.   LEVOTHYROXINE (SYNTHROID, LEVOTHROID) 75 MCG TABLET    Take 75 mcg by mouth daily before breakfast.   LORAZEPAM (ATIVAN) 0.5 MG TABLET    0.5 mg every 4 (four) hours as needed.    MEMANTINE HCL ER (NAMENDA XR) 28 MG CP24    Take by mouth.   OXYCODONE (OXY IR/ROXICODONE) 5 MG IMMEDIATE RELEASE TABLET    Take 1-2 tablets (5-10  mg total) by mouth every 3 (three) hours as needed.   POLYETHYLENE GLYCOL (MIRALAX / GLYCOLAX) PACKET    Take 17 g by mouth daily as needed.   SENNA (SENOKOT) 8.6 MG TABLET    Take 2 tablets by mouth daily.    SERTRALINE (ZOLOFT) 25 MG TABLET    Take 75 mg by mouth daily.    TAMSULOSIN (FLOMAX) 0.4 MG CAPS CAPSULE    Take 1 capsule (0.4 mg total) by mouth daily.  Modified Medications   No medications on file  Discontinued Medications   No medications on file     Physical Exam: Physical Exam  Constitutional: He is oriented to  person, place, and time. He appears well-developed and well-nourished. No distress.  generalized weakness. No longer transferring self and ambulates with walker.   HENT:  Head: Normocephalic and atraumatic.  Right Ear: External ear normal.  Left Ear: External ear normal.  Nose: Nose normal.  Mouth/Throat: Oropharynx is clear and moist. No oropharyngeal exudate.  Eyes: Conjunctivae and EOM are normal. Pupils are equal, round, and reactive to light. Right eye exhibits no discharge. Left eye exhibits no discharge. No scleral icterus.  Neck: Normal range of motion. Neck supple. No JVD present. No tracheal deviation present. No thyromegaly present.  Cardiovascular: Normal rate, regular rhythm, normal heart sounds and intact distal pulses.   No murmur heard. Pulmonary/Chest: Effort normal. No stridor. No respiratory distress. He has no wheezes. He has rales. He exhibits no tenderness.  dry rales posterior lower lungs   Abdominal: Soft. Bowel sounds are normal. He exhibits no distension. There is no tenderness. There is no rebound and no guarding.  Genitourinary: Penis normal.  Musculoskeletal: Normal range of motion. He exhibits edema and tenderness.   Left knee mild swelling and warmth with limited flexion and extension ROM. C/o R+L knee pain.  Only trace edema in ankles.   Lymphadenopathy:    He has no cervical adenopathy.  Neurological: He is alert and oriented to  person, place, and time. He has normal reflexes. No cranial nerve deficit. He exhibits normal muscle tone. Coordination abnormal.  Decreased coordination of the RUE-improving.   Skin: Skin is warm and dry. No rash noted. He is not diaphoretic. There is erythema. No pallor.  The left knee surgical incision is healed. Mild fever and erythema still presents.    Psychiatric: His speech is normal. Judgment and thought content normal. His mood appears anxious. He is slowed and withdrawn. Cognition and memory are impaired. He exhibits abnormal recent memory.  Flat affect-improved.    Filed Vitals:   12/11/14 1311  BP: 136/80  Pulse: 73  Temp: 98.1 F (36.7 C)  TempSrc: Tympanic  Resp: 18      Labs reviewed: Basic Metabolic Panel:  Recent Labs  06/04/14 07/30/14 09/21/14 11/04/14 11/16/14  NA 140  --  142 140 140  K 4.1  --  4.0 4.1 4.2  BUN 27*  --  36* 27* 19  CREATININE 0.8  --  1.0 1.3 0.9  TSH 5.53 1.50  --   --  1.82   Liver Function Tests:  Recent Labs  09/21/14  AST 17  ALT 12  ALKPHOS 121   No results for input(s): LIPASE, AMYLASE in the last 8760 hours. No results for input(s): AMMONIA in the last 8760 hours. CBC:  Recent Labs  09/21/14 11/04/14  WBC 7.2 8.1  HGB 13.0* 15.9  HCT 48 48  PLT 186 155   Lipid Panel: No results for input(s): CHOL, HDL, LDLCALC, TRIG, CHOLHDL, LDLDIRECT in the last 8760 hours.  Past Procedures:  10/28/13 CXR minimal cardiomegaly wihtout pulmonary vascular congestion, no pleural effusion, patchy bibasilar pneumonitis.   12/02/13 CT with and without contrast: atrophy and chronic left external capsule infarct. No acute abnormality.    Assessment/Plan Wheezes Resolved since the patient is well diuresed.     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 Namenda started and tolerated.      Loss of weight 11/06/14 # 176 12/15, # 174 1/16, # 154 2/16. Zoloft was decreased to 50mg  10/16/14. Now  increased to 75mg  11/04/14. CBC and BMP 11/04/14 unremarkable. 07/30/14  TSH 1.5. UTI is treated with Cipro. Dietary consult and observe weights. Update TSH and BMP 12/11/14 continue to monitor weight and dietary supplement.    Hypothyroidism TSH 1.761 12/01/13 06/04/14 TSH 5.533-up Levothyroxine to 80mcg 07/31/14 TSH 1.502 continue Levothyroxine 40mcg daily.  11/06/14 update TSH   HTN (hypertension) Controlled.     Edema Trace edema, continue Furosemide 20mg  daily, mild DOE occasionally,  06/04/14 Bun/creat 27/0.8 11/03/14 edema and DOE are not apparent, will decrease Furosemide to 10mg  daily.  11/06/14 no change in edema. Continue to observe. Update BMP since creatinine 1.30 11/04/14.  12/11/14 not apparent-improved.      Constipation Stable with Senna II qd, MiraLax dailly, Colace 100mg  bid,  and prn Bisacodyl suppository prn     BPH (benign prostatic hyperplasia) Takes Tamsulosin. No urinary retention.      Adjustment disorder with mixed anxiety and depressed mood No behaviors noted and mood seems good-10/16/14 Pharm GDR decreasing Sertraline to 50mg  po qd and continue Depakote 250mg   11/03/14 staff reported the the patient has exhibited symptoms of relapsed depression-may consider increase Zoloft after UTI is fully treated with Cipro.  11/04/14 Zoloft 75mg  12/11/14 mood is stable.        Family/ Staff Communication: observe the patient.   Goals of Care: SNF  Labs/tests ordered: none

## 2014-12-11 NOTE — Assessment & Plan Note (Signed)
Takes Tamsulosin. No urinary retention.

## 2014-12-11 NOTE — Assessment & Plan Note (Signed)
11/06/14 # 176 12/15, # 174 1/16, # 154 2/16. Zoloft was decreased to 50mg  10/16/14. Now increased to 75mg  11/04/14. CBC and BMP 11/04/14 unremarkable. 07/30/14 TSH 1.5. UTI is treated with Cipro. Dietary consult and observe weights. Update TSH and BMP 12/11/14 continue to monitor weight and dietary supplement.

## 2014-12-11 NOTE — Assessment & Plan Note (Signed)
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 Namenda started and tolerated.

## 2014-12-11 NOTE — Assessment & Plan Note (Signed)
No behaviors noted and mood seems good-10/16/14 Pharm GDR decreasing Sertraline to 50mg  po qd and continue Depakote 250mg   11/03/14 staff reported the the patient has exhibited symptoms of relapsed depression-may consider increase Zoloft after UTI is fully treated with Cipro.  11/04/14 Zoloft 75mg  12/11/14 mood is stable.

## 2014-12-11 NOTE — Assessment & Plan Note (Signed)
Resolved since the patient is well diuresed.

## 2014-12-11 NOTE — Assessment & Plan Note (Signed)
Stable with Senna II qd, MiraLax dailly, Colace 100mg  bid,  and prn Bisacodyl suppository prn

## 2014-12-18 ENCOUNTER — Non-Acute Institutional Stay (SKILLED_NURSING_FACILITY): Payer: Medicare Other | Admitting: Internal Medicine

## 2014-12-18 DIAGNOSIS — N390 Urinary tract infection, site not specified: Secondary | ICD-10-CM | POA: Diagnosis not present

## 2014-12-18 DIAGNOSIS — E785 Hyperlipidemia, unspecified: Secondary | ICD-10-CM

## 2014-12-18 DIAGNOSIS — E039 Hypothyroidism, unspecified: Secondary | ICD-10-CM

## 2014-12-18 DIAGNOSIS — I10 Essential (primary) hypertension: Secondary | ICD-10-CM | POA: Diagnosis not present

## 2014-12-18 DIAGNOSIS — Z96652 Presence of left artificial knee joint: Secondary | ICD-10-CM

## 2014-12-18 DIAGNOSIS — M24562 Contracture, left knee: Secondary | ICD-10-CM | POA: Diagnosis not present

## 2014-12-18 DIAGNOSIS — R413 Other amnesia: Secondary | ICD-10-CM | POA: Diagnosis not present

## 2014-12-18 DIAGNOSIS — R319 Hematuria, unspecified: Secondary | ICD-10-CM | POA: Diagnosis not present

## 2014-12-18 NOTE — Progress Notes (Signed)
Patient ID: William Roach, male   DOB: [birth year], 81 y.o.   MRN: [id number]    [place] Room Number: N 15  Place of Service: SNF (31)    No Known Allergies  Chief Complaint  Patient presents with  . Medical Management of Chronic Issues    HPI:  Essential hypertension: Controlled  Hyperlipidemia: Not checked recently and not currently medicated.  Hypothyroidism, unspecified hypothyroidism type: Compensated. Normal TSH February 2016.  Memory deficit: Seems to be advancing.  Urinary tract infection with hematuria, site unspecified recurrent urinary infection in February 2016. Treated with antibiotics. Asymptomatic at present.  Status post total left knee replacement. Knee replacement 2014. Area is well-healed. Unfortunately there is a flexion contracture.  Flexion contracture of left knee: Following the knee replacement in 2014. Painless.    Medications: Patient's Medications  New Prescriptions   No medications on file  Previous Medications   ASPIRIN 325 MG TABLET    Take 325 mg by mouth 2 (two) times daily.   BISACODYL (DULCOLAX) 10 MG SUPPOSITORY    Place 1 suppository (10 mg total) rectally daily as needed.   DOCUSATE SODIUM (COLACE) 100 MG CAPSULE    Take 100 mg by mouth 2 (two) times daily.   LEVOTHYROXINE (SYNTHROID, LEVOTHROID) 75 MCG TABLET    Take 75 mcg by mouth daily before breakfast.   LORAZEPAM (ATIVAN) 0.5 MG TABLET    0.5 mg every 4 (four) hours as needed.    MEMANTINE HCL ER (NAMENDA XR) 28 MG CP24    Take by mouth.   OXYCODONE (OXY IR/ROXICODONE) 5 MG IMMEDIATE RELEASE TABLET    Take 1-2 tablets (5-10 mg total) by mouth every 3 (three) hours as needed.   POLYETHYLENE GLYCOL (MIRALAX / GLYCOLAX) PACKET    Take 17 g by mouth daily as needed.   SENNA (SENOKOT) 8.6 MG TABLET    Take 2 tablets by mouth daily.    SERTRALINE (ZOLOFT) 25 MG TABLET    Take 75 mg by mouth daily.    TAMSULOSIN (FLOMAX) 0.4 MG CAPS CAPSULE    Take 1  capsule (0.4 mg total) by mouth daily.  Modified Medications   No medications on file  Discontinued Medications   No medications on file     Review of Systems  Constitutional: Positive for activity change and fatigue. Negative for fever, chills, diaphoresis and unexpected weight change.  HENT: Positive for hearing loss (using aids). Negative for congestion, trouble swallowing and voice change.   Eyes: Negative.   Respiratory: Negative.  Negative for cough, choking and shortness of breath.   Cardiovascular: Negative for chest pain, palpitations and leg swelling.  Gastrointestinal: Positive for constipation. Negative for nausea, abdominal pain, diarrhea, blood in stool and abdominal distention.  Endocrine: Negative.   Genitourinary:       History BPH and urolithiasis.  Musculoskeletal:       Unstable on standing. Unable to walk any distance. Mild flexion contracture the left knee. History of left TKR 2014.  Allergic/Immunologic: Negative.   Neurological:       Chronic memory loss to a moderate degree. Last MMSE 22/30. Failed clock drawing.  Psychiatric/Behavioral: Positive for decreased concentration. Negative for behavioral problems, sleep disturbance and agitation. The patient is nervous/anxious.     Filed Vitals:   12/18/14 1209  BP: 136/80  Pulse: 73  Temp: 98.1 F (36.7 C)  Resp: 20  Height: 5\' 8"  (1.727 m)  Weight: 157 lb 9.6 oz (71.487 kg)  SpO2: 94%   Body mass index is 23.97 kg/(m^2).  Physical Exam  Constitutional: He appears well-developed and well-nourished. No distress.  HENT:  Mouth/Throat: No oropharyngeal exudate.  Bilateral hearing loss and use of hearing aids.  Neck: No JVD present. No tracheal deviation present. No thyromegaly present.  Cardiovascular: Normal rate, regular rhythm, normal heart sounds and intact distal pulses.  Exam reveals no gallop and no friction rub.   No murmur heard. Pulmonary/Chest: No respiratory distress. He has no wheezes. He  has no rales. He exhibits no tenderness.  Abdominal: He exhibits no distension and no mass. There is no tenderness.  Musculoskeletal: He exhibits tenderness (Left knee). He exhibits no edema.  History of left TKR in September 2014. Incision is well-healed. There is a mild flexion contracture. He is unable to stand without assistance and is quite unstable. He has not been able to walk. There is mild discomfort with extension and flexion of the left knee.  Lymphadenopathy:    He has no cervical adenopathy.  Neurological: He is alert. No cranial nerve deficit. Coordination normal.  Significant memory loss. Impaired ability to understand and respond appropriately.  Skin: No rash noted. No erythema. No pallor.  Psychiatric: He has a normal mood and affect. His behavior is normal. Judgment and thought content normal.     Labs reviewed: Lab on 11/17/2014  Component Date Value Ref Range Status  . Glucose 11/16/2014 78   Final  . BUN 11/16/2014 19  4 - 21 mg/dL Final  . Creatinine 11/16/2014 0.9  .6 - 1.3 mg/dL Final  . Potassium 11/16/2014 4.2  3.4 - 5.3 mmol/L Final  . Sodium 11/16/2014 140  137 - 147 mmol/L Final  . TSH 11/16/2014 1.82  .41 - 5.90 uIU/mL Final  Nursing Home on 11/06/2014  Component Date Value Ref Range Status  . Hemoglobin 11/04/2014 15.9  13.5 - 17.5 g/dL Final  . HCT 11/04/2014 48  41 - 53 % Final  . Platelets 11/04/2014 155  150 - 399 K/L Final  . WBC 11/04/2014 8.1   Final  . Glucose 11/04/2014 113   Final  . BUN 11/04/2014 27* 4 - 21 mg/dL Final  . Creatinine 11/04/2014 1.3  0.6 - 1.3 mg/dL Final  . Potassium 11/04/2014 4.1  3.4 - 5.3 mmol/L Final  . Sodium 11/04/2014 140  137 - 147 mmol/L Final  Lab on 09/22/2014  Component Date Value Ref Range Status  . Hemoglobin 09/21/2014 13.0* 13.5 - 17.5 g/dL Final  . HCT 09/21/2014 48  41 - 53 % Final  . Platelets 09/21/2014 186  150 - 399 K/L Final  . WBC 09/21/2014 7.2   Final  . Glucose 09/21/2014 89   Final  . BUN  09/21/2014 36* 4 - 21 mg/dL Final  . Creatinine 09/21/2014 1.0  0.6 - 1.3 mg/dL Final  . Potassium 09/21/2014 4.0  3.4 - 5.3 mmol/L Final  . Sodium 09/21/2014 142  137 - 147 mmol/L Final  . Alkaline Phosphatase 09/21/2014 121  25 - 125 U/L Final  . ALT 09/21/2014 12  10 - 40 U/L Final  . AST 09/21/2014 17  14 - 40 U/L Final  . Bilirubin, Total 09/21/2014 0.6   Final     Assessment/Plan 1. Essential hypertension Controlled  2. Hyperlipidemia Follow-up lab, lipid panel ordered  3. Hypothyroidism, unspecified hypothyroidism type Controlled/compensated  4. Memory deficit Progressing  5. Urinary tract infection with hematuria, site unspecified Continue to be observant to recurrent problems  6.  Status post total left knee replacement Unchanged  7. Flexion contracture of left knee Unchanged

## 2015-01-12 ENCOUNTER — Encounter: Payer: Self-pay | Admitting: Nurse Practitioner

## 2015-01-12 ENCOUNTER — Non-Acute Institutional Stay (SKILLED_NURSING_FACILITY): Payer: Medicare Other | Admitting: Nurse Practitioner

## 2015-01-12 DIAGNOSIS — N400 Enlarged prostate without lower urinary tract symptoms: Secondary | ICD-10-CM

## 2015-01-12 DIAGNOSIS — R413 Other amnesia: Secondary | ICD-10-CM | POA: Diagnosis not present

## 2015-01-12 DIAGNOSIS — K5900 Constipation, unspecified: Secondary | ICD-10-CM

## 2015-01-12 DIAGNOSIS — I10 Essential (primary) hypertension: Secondary | ICD-10-CM

## 2015-01-12 DIAGNOSIS — F4323 Adjustment disorder with mixed anxiety and depressed mood: Secondary | ICD-10-CM

## 2015-01-12 DIAGNOSIS — E039 Hypothyroidism, unspecified: Secondary | ICD-10-CM | POA: Diagnosis not present

## 2015-01-12 DIAGNOSIS — R609 Edema, unspecified: Secondary | ICD-10-CM

## 2015-01-12 NOTE — Assessment & Plan Note (Signed)
TSH 1.761 12/01/13 06/04/14 TSH 5.533-up Levothyroxine to 55mcg 07/31/14 TSH 1.502 continue Levothyroxine 26mcg daily.  11/06/14 update TSH

## 2015-01-12 NOTE — Assessment & Plan Note (Signed)
Controlled.  

## 2015-01-12 NOTE — Assessment & Plan Note (Signed)
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 Namenda started and tolerated.

## 2015-01-12 NOTE — Assessment & Plan Note (Signed)
Stable with Senna II qd, MiraLax dailly, Colace 100mg  bid,  and prn Bisacodyl suppository prn

## 2015-01-12 NOTE — Assessment & Plan Note (Signed)
No behaviors noted and mood seems good-10/16/14 Pharm GDR decreasing Sertraline to 50mg  po qd and continue Depakote 250mg   11/03/14 staff reported the the patient has exhibited symptoms of relapsed depression-may consider increase Zoloft after UTI is fully treated with Cipro.  11/04/14 Zoloft 75mg  -- mood is stable.

## 2015-01-12 NOTE — Progress Notes (Signed)
Patient ID: William Roach, male   DOB: 02-25-1921, 81 y.o.   MRN: [id number]   Code Status: DNR  No Known Allergies  Chief Complaint  Patient presents with  . Medical Management of Chronic Issues    HPI: Patient is a 81 y.o. male seen in the SNF at [facility] today for evaluation of chronic medical conditions.  Problem List Items Addressed This Visit    Adjustment disorder with mixed anxiety and depressed mood - Primary    No behaviors noted and mood seems good-10/16/14 Pharm GDR decreasing Sertraline to 50mg  po qd and continue Depakote 250mg   11/03/14 staff reported the the patient has exhibited symptoms of relapsed depression-may consider increase Zoloft after UTI is fully treated with Cipro.  11/04/14 Zoloft 75mg  -- mood is stable.         BPH (benign prostatic hyperplasia)    Takes Tamsulosin. No urinary retention.         Constipation    Stable with Senna II qd, MiraLax dailly, Colace 100mg  bid,  and prn Bisacodyl suppository prn         Edema    Trace edema, continue Furosemide 20mg  daily, mild DOE occasionally,  06/04/14 Bun/creat 27/0.8 11/03/14 edema and DOE are not apparent, will decrease Furosemide to 10mg  daily.  11/06/14 no change in edema. Continue to observe. Update BMP since creatinine 1.30 11/04/14.  -- not apparent-improved.         HTN (hypertension)    Controlled.       Hypothyroidism    TSH 1.761 12/01/13 06/04/14 TSH 5.533-up Levothyroxine to 14mcg 07/31/14 TSH 1.502 continue Levothyroxine 16mcg daily.  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 Namenda started and tolerated.            Review of Systems:  Review of Systems  Constitutional: Negative for fever, chills and diaphoresis.  HENT: Positive for hearing loss. Negative for congestion, ear discharge, ear pain, nosebleeds, sore throat and tinnitus.   Eyes: Negative for photophobia, pain, discharge and redness.    Respiratory: Negative for cough, shortness of breath, wheezing and stridor.   Cardiovascular: Positive for leg swelling. Negative for chest pain and palpitations.       Trace  Gastrointestinal: Negative for nausea, vomiting, abdominal pain, diarrhea, constipation and blood in stool.  Endocrine: Negative for polydipsia.  Genitourinary: Positive for frequency. Negative for dysuria, urgency, hematuria and flank pain.  Musculoskeletal: Negative for myalgias, back pain and neck pain.  Skin: Negative for rash.  Allergic/Immunologic: Negative for environmental allergies.  Neurological: Negative for dizziness, tremors, seizures, weakness and headaches.  Hematological: Does not bruise/bleed easily.  Psychiatric/Behavioral: Negative for suicidal ideas and hallucinations. The patient is nervous/anxious.      Past Medical History  Diagnosis Date  . Hypercholesterolemia   . Fatigue   . Basal cell carcinoma, ear     post -- partial pinnectomy left side with primary closure    . History of kidney stones     x1  . Acute blood loss anemia 06/30/2013  . Adjustment disorder with mixed anxiety and depressed mood 07/04/2013  . CAD (coronary artery disease) 07/04/2011    S/p CABG   . CVA, old, cognitive deficits 12/05/2013  . Dizziness and giddiness 10/07/2013  . Gait difficulty 02/17/2014  . HTN (hypertension) 07/04/2011  . Hyperlipidemia 07/04/2011  . Lack of coordination 11/08/2013  . Memory deficit 07/29/2013  . Numbness  in both legs 10/31/2013  . Osteoarthritis of left knee 06/27/2013  . PAD (peripheral artery disease) 07/29/2013  . S/P knee replacement 07/01/2013  . Situational depression 10/17/2013  . Unspecified constipation 07/01/2013  . Unspecified hypothyroidism 07/01/2013     TSH 1.761 12/01/13     . Weakness 12/01/2013   Past Surgical History  Procedure Laterality Date  . Coronary artery bypass graft  2003    x4 --   . Cholecystectomy  2002  . Inguinal hernia repair  09/19/2007  . Ear cyst  excision  12/14/2009    Excision of left auricular cyst with primary closure  . Basal cell carcinoma excision  01/14/2010    Wedge resection, partial pinnectomy left side with primary closure      . Tonsillectomy    . Carpal tunnel release    . Cataract surgery Bilateral 06-24-13  . Tonsillectomy    . Cystoscopy    . Total knee arthroplasty Left 06/27/2013    Procedure: LEFT TOTAL KNEE ARTHROPLASTY;  Surgeon: Tobi Bastos, MD;  Location: [facility];  Service: Orthopedics;  Laterality: Left;   Social History:   reports that he quit smoking about 33 years ago. He does not have any smokeless tobacco history on file. He reports that he does not drink alcohol or use illicit drugs.  Family History  Problem Relation Age of Onset  . Parkinsonism Father   . Angina Mother   . Coronary artery disease Brother     with CABG    Medications: Patient's Medications  New Prescriptions   No medications on file  Previous Medications   ASPIRIN 325 MG TABLET    Take 325 mg by mouth 2 (two) times daily.   BISACODYL (DULCOLAX) 10 MG SUPPOSITORY    Place 1 suppository (10 mg total) rectally daily as needed.   DOCUSATE SODIUM (COLACE) 100 MG CAPSULE    Take 100 mg by mouth 2 (two) times daily.   LEVOTHYROXINE (SYNTHROID, LEVOTHROID) 75 MCG TABLET    Take 75 mcg by mouth daily before breakfast.   LORAZEPAM (ATIVAN) 0.5 MG TABLET    0.5 mg every 4 (four) hours as needed.    MEMANTINE HCL ER (NAMENDA XR) 28 MG CP24    Take by mouth.   OXYCODONE (OXY IR/ROXICODONE) 5 MG IMMEDIATE RELEASE TABLET    Take 1-2 tablets (5-10 mg total) by mouth every 3 (three) hours as needed.   POLYETHYLENE GLYCOL (MIRALAX / GLYCOLAX) PACKET    Take 17 g by mouth daily as needed.   SENNA (SENOKOT) 8.6 MG TABLET    Take 2 tablets by mouth daily.    SERTRALINE (ZOLOFT) 25 MG TABLET    Take 75 mg by mouth daily.    TAMSULOSIN (FLOMAX) 0.4 MG CAPS CAPSULE    Take 1 capsule (0.4 mg total) by mouth daily.  Modified Medications   No  medications on file  Discontinued Medications   No medications on file     Physical Exam: Physical Exam  Constitutional: He is oriented to person, place, and time. He appears well-developed and well-nourished. No distress.  generalized weakness. No longer transferring self and ambulates with walker.   HENT:  Head: Normocephalic and atraumatic.  Right Ear: External ear normal.  Left Ear: External ear normal.  Nose: Nose normal.  Mouth/Throat: Oropharynx is clear and moist. No oropharyngeal exudate.  Eyes: Conjunctivae and EOM are normal. Pupils are equal, round, and reactive to light. Right eye exhibits no discharge. Left eye exhibits  no discharge. No scleral icterus.  Neck: Normal range of motion. Neck supple. No JVD present. No tracheal deviation present. No thyromegaly present.  Cardiovascular: Normal rate, regular rhythm, normal heart sounds and intact distal pulses.   No murmur heard. Pulmonary/Chest: Effort normal. No stridor. No respiratory distress. He has no wheezes. He has rales. He exhibits no tenderness.  dry rales posterior lower lungs   Abdominal: Soft. Bowel sounds are normal. He exhibits no distension. There is no tenderness. There is no rebound and no guarding.  Genitourinary: Penis normal.  Musculoskeletal: Normal range of motion. He exhibits edema and tenderness.   Left knee mild swelling and warmth with limited flexion and extension ROM. C/o R+L knee pain.  Only trace edema in ankles.   Lymphadenopathy:    He has no cervical adenopathy.  Neurological: He is alert and oriented to person, place, and time. He has normal reflexes. No cranial nerve deficit. He exhibits normal muscle tone. Coordination abnormal.  Decreased coordination of the RUE-improving.   Skin: Skin is warm and dry. No rash noted. He is not diaphoretic. There is erythema. No pallor.  The left knee surgical incision is healed. Mild fever and erythema still presents.    Psychiatric: His speech is  normal. Judgment and thought content normal. His mood appears anxious. He is slowed and withdrawn. Cognition and memory are impaired. He exhibits abnormal recent memory.  Flat affect-improved.    Filed Vitals:   01/12/15 1547  BP: 132/72  Pulse: 71  Temp: 96.7 F (35.9 C)  TempSrc: Tympanic  Resp: 20      Labs reviewed: Basic Metabolic Panel:  Recent Labs  06/04/14 07/30/14 09/21/14 11/04/14 11/16/14  NA 140  --  142 140 140  K 4.1  --  4.0 4.1 4.2  BUN 27*  --  36* 27* 19  CREATININE 0.8  --  1.0 1.3 0.9  TSH 5.53 1.50  --   --  1.82   Liver Function Tests:  Recent Labs  09/21/14  AST 17  ALT 12  ALKPHOS 121   No results for input(s): LIPASE, AMYLASE in the last 8760 hours. No results for input(s): AMMONIA in the last 8760 hours. CBC:  Recent Labs  09/21/14 11/04/14  WBC 7.2 8.1  HGB 13.0* 15.9  HCT 48 48  PLT 186 155   Lipid Panel: No results for input(s): CHOL, HDL, LDLCALC, TRIG, CHOLHDL, LDLDIRECT in the last 8760 hours.  Past Procedures:  10/28/13 CXR minimal cardiomegaly wihtout pulmonary vascular congestion, no pleural effusion, patchy bibasilar pneumonitis.   12/02/13 CT with and without contrast: atrophy and chronic left external capsule infarct. No acute abnormality.    Assessment/Plan Adjustment disorder with mixed anxiety and depressed mood No behaviors noted and mood seems good-10/16/14 Pharm GDR decreasing Sertraline to 50mg  po qd and continue Depakote 250mg   11/03/14 staff reported the the patient has exhibited symptoms of relapsed depression-may consider increase Zoloft after UTI is fully treated with Cipro.  11/04/14 Zoloft 75mg  -- mood is stable.      BPH (benign prostatic hyperplasia) Takes Tamsulosin. No urinary retention.      Constipation Stable with Senna II qd, MiraLax dailly, Colace 100mg  bid,  and prn Bisacodyl suppository prn      Edema Trace edema, continue Furosemide 20mg  daily, mild DOE occasionally,  06/04/14  Bun/creat 27/0.8 11/03/14 edema and DOE are not apparent, will decrease Furosemide to 10mg  daily.  11/06/14 no change in edema. Continue to observe. Update BMP since creatinine 1.30  11/04/14.  -- not apparent-improved.      HTN (hypertension) Controlled.    Hypothyroidism TSH 1.761 12/01/13 06/04/14 TSH 5.533-up Levothyroxine to 49mcg 07/31/14 TSH 1.502 continue Levothyroxine 59mcg daily.  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 Namenda started and tolerated.        Family/ Staff Communication: observe the patient.   Goals of Care: SNF  Labs/tests ordered: none

## 2015-01-12 NOTE — Assessment & Plan Note (Signed)
Takes Tamsulosin. No urinary retention.

## 2015-01-12 NOTE — Assessment & Plan Note (Signed)
Trace edema, continue Furosemide 20mg  daily, mild DOE occasionally,  06/04/14 Bun/creat 27/0.8 11/03/14 edema and DOE are not apparent, will decrease Furosemide to 10mg  daily.  11/06/14 no change in edema. Continue to observe. Update BMP since creatinine 1.30 11/04/14.  -- not apparent-improved.

## 2015-02-09 ENCOUNTER — Encounter: Payer: Self-pay | Admitting: Nurse Practitioner

## 2015-02-09 ENCOUNTER — Non-Acute Institutional Stay (SKILLED_NURSING_FACILITY): Payer: Medicare Other | Admitting: Nurse Practitioner

## 2015-02-09 DIAGNOSIS — I10 Essential (primary) hypertension: Secondary | ICD-10-CM | POA: Diagnosis not present

## 2015-02-09 DIAGNOSIS — R413 Other amnesia: Secondary | ICD-10-CM

## 2015-02-09 DIAGNOSIS — K5900 Constipation, unspecified: Secondary | ICD-10-CM | POA: Diagnosis not present

## 2015-02-09 DIAGNOSIS — N400 Enlarged prostate without lower urinary tract symptoms: Secondary | ICD-10-CM | POA: Diagnosis not present

## 2015-02-09 DIAGNOSIS — R609 Edema, unspecified: Secondary | ICD-10-CM

## 2015-02-09 DIAGNOSIS — E039 Hypothyroidism, unspecified: Secondary | ICD-10-CM

## 2015-02-09 DIAGNOSIS — B372 Candidiasis of skin and nail: Secondary | ICD-10-CM | POA: Diagnosis not present

## 2015-02-09 DIAGNOSIS — F4323 Adjustment disorder with mixed anxiety and depressed mood: Secondary | ICD-10-CM | POA: Diagnosis not present

## 2015-02-09 NOTE — Assessment & Plan Note (Signed)
In scrotal area with excoriation on the left. Cleansed the affected area with soap and water, dry, then apply Nystatin powder daily until healed.

## 2015-02-09 NOTE — Assessment & Plan Note (Signed)
TSH 1.761 12/01/13 06/04/14 TSH 5.533-up Levothyroxine to 70mcg 07/31/14 TSH 1.502 continue Levothyroxine 34mcg daily.  11/16/14 TSH 1.815

## 2015-02-09 NOTE — Assessment & Plan Note (Signed)
Not apparent, continue Furosemide 10mg  daily.

## 2015-02-09 NOTE — Assessment & Plan Note (Signed)
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 Namenda started and tolerated.

## 2015-02-09 NOTE — Progress Notes (Signed)
Patient ID: William Roach, male   DOB: 1921/07/06, 81 y.o.   MRN: [id number]   Code Status: DNR  No Known Allergies  Chief Complaint  Patient presents with  . Medical Management of Chronic Issues  . Acute Visit    excoriated scrotum    HPI: Patient is a 81 y.o. male seen in the SNF at [facility] today for evaluation of excoriated scrotum and chronic medical conditions.  Problem List Items Addressed This Visit    HTN (hypertension) - Primary    Permissive bp control with Sbp in 140s.       BPH (benign prostatic hyperplasia)    Takes Tamsulosin. No urinary retention.        Constipation    table with Senna II qd, MiraLax dailly, Colace 100mg  bid,  and prn Bisacodyl suppository prn         Hypothyroidism    TSH 1.761 12/01/13 06/04/14 TSH 5.533-up Levothyroxine to 60mcg 07/31/14 TSH 1.502 continue Levothyroxine 65mcg daily.  11/16/14 TSH 1.815      Adjustment disorder with mixed anxiety and depressed mood    No behaviors noted and mood seems good-10/16/14 Pharm GDR decreasing Sertraline to 50mg  po qd and continue Depakote 250mg   11/03/14 staff reported the the patient has exhibited symptoms of relapsed depression-may consider increase Zoloft after UTI is fully treated with Cipro.  11/04/14 Zoloft 75mg  -- mood is stable.        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 Namenda started and tolerated.          Edema    Not apparent, continue Furosemide 10mg  daily.       Skin candidiasis    In scrotal area with excoriation on the left. Cleansed the affected area with soap and water, dry, then apply Nystatin powder daily until healed.          Review of Systems:  Review of Systems  Constitutional: Negative for fever, chills and diaphoresis.  HENT: Positive for hearing loss. Negative for congestion, ear discharge, ear pain, nosebleeds, sore throat and tinnitus.   Eyes: Negative for photophobia, pain, discharge and redness.    Respiratory: Negative for cough, shortness of breath, wheezing and stridor.   Cardiovascular: Positive for leg swelling. Negative for chest pain and palpitations.       Trace LLE  Gastrointestinal: Negative for nausea, vomiting, abdominal pain, diarrhea, constipation and blood in stool.  Endocrine: Negative for polydipsia.  Genitourinary: Positive for frequency. Negative for dysuria, urgency, hematuria and flank pain.  Musculoskeletal: Negative for myalgias, back pain and neck pain.       Spasticity in knees and hips  Skin: Negative for rash.       Excoriated scrotum with small open area on the left side-no s/s of infection.   Allergic/Immunologic: Negative for environmental allergies.  Neurological: Negative for dizziness, tremors, seizures, weakness and headaches.  Hematological: Does not bruise/bleed easily.  Psychiatric/Behavioral: Negative for suicidal ideas and hallucinations. The patient is nervous/anxious.      Past Medical History  Diagnosis Date  . Hypercholesterolemia   . Fatigue   . Basal cell carcinoma, ear     post -- partial pinnectomy left side with primary closure    . History of kidney stones     x1  . Acute blood loss anemia 06/30/2013  . Adjustment disorder with mixed anxiety and depressed mood 07/04/2013  . CAD (coronary artery disease) 07/04/2011    S/p CABG   .  CVA, old, cognitive deficits 12/05/2013  . Dizziness and giddiness 10/07/2013  . Gait difficulty 02/17/2014  . HTN (hypertension) 07/04/2011  . Hyperlipidemia 07/04/2011  . Lack of coordination 11/08/2013  . Memory deficit 07/29/2013  . Numbness in both legs 10/31/2013  . Osteoarthritis of left knee 06/27/2013  . PAD (peripheral artery disease) 07/29/2013  . S/P knee replacement 07/01/2013  . Situational depression 10/17/2013  . Unspecified constipation 07/01/2013  . Unspecified hypothyroidism 07/01/2013     TSH 1.761 12/01/13     . Weakness 12/01/2013   Past Surgical History  Procedure Laterality Date  .  Coronary artery bypass graft  2003    x4 --   . Cholecystectomy  2002  . Inguinal hernia repair  09/19/2007  . Ear cyst excision  12/14/2009    Excision of left auricular cyst with primary closure  . Basal cell carcinoma excision  01/14/2010    Wedge resection, partial pinnectomy left side with primary closure      . Tonsillectomy    . Carpal tunnel release    . Cataract surgery Bilateral 06-24-13  . Tonsillectomy    . Cystoscopy    . Total knee arthroplasty Left 06/27/2013    Procedure: LEFT TOTAL KNEE ARTHROPLASTY;  Surgeon: Tobi Bastos, MD;  Location: [facility];  Service: Orthopedics;  Laterality: Left;   Social History:   reports that he quit smoking about 33 years ago. He does not have any smokeless tobacco history on file. He reports that he does not drink alcohol or use illicit drugs.  Family History  Problem Relation Age of Onset  . Parkinsonism Father   . Angina Mother   . Coronary artery disease Brother     with CABG    Medications: Patient's Medications  New Prescriptions   No medications on file  Previous Medications   ASPIRIN 325 MG TABLET    Take 325 mg by mouth 2 (two) times daily.   BISACODYL (DULCOLAX) 10 MG SUPPOSITORY    Place 1 suppository (10 mg total) rectally daily as needed.   DOCUSATE SODIUM (COLACE) 100 MG CAPSULE    Take 100 mg by mouth 2 (two) times daily.   LEVOTHYROXINE (SYNTHROID, LEVOTHROID) 75 MCG TABLET    Take 75 mcg by mouth daily before breakfast.   LORAZEPAM (ATIVAN) 0.5 MG TABLET    0.5 mg every 4 (four) hours as needed.    MEMANTINE HCL ER (NAMENDA XR) 28 MG CP24    Take by mouth.   OXYCODONE (OXY IR/ROXICODONE) 5 MG IMMEDIATE RELEASE TABLET    Take 1-2 tablets (5-10 mg total) by mouth every 3 (three) hours as needed.   POLYETHYLENE GLYCOL (MIRALAX / GLYCOLAX) PACKET    Take 17 g by mouth daily as needed.   SENNA (SENOKOT) 8.6 MG TABLET    Take 2 tablets by mouth daily.    SERTRALINE (ZOLOFT) 25 MG TABLET    Take 75 mg by mouth daily.      TAMSULOSIN (FLOMAX) 0.4 MG CAPS CAPSULE    Take 1 capsule (0.4 mg total) by mouth daily.  Modified Medications   No medications on file  Discontinued Medications   No medications on file     Physical Exam: Physical Exam  Constitutional: He is oriented to person, place, and time. He appears well-developed and well-nourished. No distress.  generalized weakness. No longer transferring self and ambulates with walker.   HENT:  Head: Normocephalic and atraumatic.  Right Ear: External ear normal.  Left Ear:  External ear normal.  Nose: Nose normal.  Mouth/Throat: Oropharynx is clear and moist. No oropharyngeal exudate.  Eyes: Conjunctivae and EOM are normal. Pupils are equal, round, and reactive to light. Right eye exhibits no discharge. Left eye exhibits no discharge. No scleral icterus.  Neck: Normal range of motion. Neck supple. No JVD present. No tracheal deviation present. No thyromegaly present.  Cardiovascular: Normal rate, regular rhythm, normal heart sounds and intact distal pulses.   No murmur heard. Pulmonary/Chest: Effort normal. No stridor. No respiratory distress. He has no wheezes. He has rales. He exhibits no tenderness.  dry rales posterior lower lungs   Abdominal: Soft. Bowel sounds are normal. He exhibits no distension. There is no tenderness. There is no rebound and no guarding.  Genitourinary: Penis normal.  Musculoskeletal: Normal range of motion. He exhibits edema and tenderness.   Left knee is limited of flexion and extension ROM. C/o R+L knee pain.  Only trace edema in the left ankle usually at end of day.  Stiffness in knees and hips  Lymphadenopathy:    He has no cervical adenopathy.  Neurological: He is alert and oriented to person, place, and time. He has normal reflexes. No cranial nerve deficit. He exhibits normal muscle tone. Coordination abnormal.  Decreased coordination of the RUE-improving.   Skin: Skin is warm and dry. No rash noted. He is not  diaphoretic. There is erythema. No pallor.  The left knee surgical incision is healed.  Excoriated scrotum with small open area on the left wo s/s of infection   Psychiatric: His speech is normal. Judgment and thought content normal. His mood appears anxious. He is slowed and withdrawn. Cognition and memory are impaired. He exhibits abnormal recent memory.  Flat affect-improved.    Filed Vitals:   02/09/15 1058  BP: 144/68  Pulse: 68  Temp: 98.3 F (36.8 C)  TempSrc: Tympanic  Resp: 16      Labs reviewed: Basic Metabolic Panel:  Recent Labs  06/04/14 07/30/14 09/21/14 11/04/14 11/16/14  NA 140  --  142 140 140  K 4.1  --  4.0 4.1 4.2  BUN 27*  --  36* 27* 19  CREATININE 0.8  --  1.0 1.3 0.9  TSH 5.53 1.50  --   --  1.82   Liver Function Tests:  Recent Labs  09/21/14  AST 17  ALT 12  ALKPHOS 121   No results for input(s): LIPASE, AMYLASE in the last 8760 hours. No results for input(s): AMMONIA in the last 8760 hours. CBC:  Recent Labs  09/21/14 11/04/14  WBC 7.2 8.1  HGB 13.0* 15.9  HCT 48 48  PLT 186 155   Lipid Panel: No results for input(s): CHOL, HDL, LDLCALC, TRIG, CHOLHDL, LDLDIRECT in the last 8760 hours.  Past Procedures:  10/28/13 CXR minimal cardiomegaly wihtout pulmonary vascular congestion, no pleural effusion, patchy bibasilar pneumonitis.   12/02/13 CT with and without contrast: atrophy and chronic left external capsule infarct. No acute abnormality.    Assessment/Plan HTN (hypertension) Permissive bp control with Sbp in 140s.    BPH (benign prostatic hyperplasia) Takes Tamsulosin. No urinary retention.     Constipation table with Senna II qd, MiraLax dailly, Colace 100mg  bid,  and prn Bisacodyl suppository prn      Hypothyroidism TSH 1.761 12/01/13 06/04/14 TSH 5.533-up Levothyroxine to 24mcg 07/31/14 TSH 1.502 continue Levothyroxine 77mcg daily.  11/16/14 TSH 1.815   Adjustment disorder with mixed anxiety and depressed mood No  behaviors noted and mood seems  good-10/16/14 Pharm GDR decreasing Sertraline to 50mg  po qd and continue Depakote 250mg   11/03/14 staff reported the the patient has exhibited symptoms of relapsed depression-may consider increase Zoloft after UTI is fully treated with Cipro.  11/04/14 Zoloft 75mg  -- mood is stable.     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 Namenda started and tolerated.       Edema Not apparent, continue Furosemide 10mg  daily.    Skin candidiasis In scrotal area with excoriation on the left. Cleansed the affected area with soap and water, dry, then apply Nystatin powder daily until healed.      Family/ Staff Communication: observe the patient.   Goals of Care: SNF  Labs/tests ordered: none

## 2015-02-09 NOTE — Assessment & Plan Note (Signed)
Permissive bp control with Sbp in 140s.

## 2015-02-09 NOTE — Assessment & Plan Note (Signed)
No behaviors noted and mood seems good-10/16/14 Pharm GDR decreasing Sertraline to 50mg  po qd and continue Depakote 250mg   11/03/14 staff reported the the patient has exhibited symptoms of relapsed depression-may consider increase Zoloft after UTI is fully treated with Cipro.  11/04/14 Zoloft 75mg  -- mood is stable.

## 2015-02-09 NOTE — Assessment & Plan Note (Signed)
Takes Tamsulosin. No urinary retention.

## 2015-02-09 NOTE — Assessment & Plan Note (Signed)
table with Senna II qd, MiraLax dailly, Colace 100mg  bid,  and prn Bisacodyl suppository prn

## 2015-02-23 ENCOUNTER — Encounter: Payer: Self-pay | Admitting: Nurse Practitioner

## 2015-02-23 ENCOUNTER — Non-Acute Institutional Stay (SKILLED_NURSING_FACILITY): Payer: Medicare Other | Admitting: Nurse Practitioner

## 2015-02-23 DIAGNOSIS — E039 Hypothyroidism, unspecified: Secondary | ICD-10-CM

## 2015-02-23 DIAGNOSIS — I10 Essential (primary) hypertension: Secondary | ICD-10-CM

## 2015-02-23 DIAGNOSIS — M24562 Contracture, left knee: Secondary | ICD-10-CM | POA: Diagnosis not present

## 2015-02-23 DIAGNOSIS — K5900 Constipation, unspecified: Secondary | ICD-10-CM

## 2015-02-23 DIAGNOSIS — N400 Enlarged prostate without lower urinary tract symptoms: Secondary | ICD-10-CM

## 2015-02-23 DIAGNOSIS — R609 Edema, unspecified: Secondary | ICD-10-CM

## 2015-02-23 DIAGNOSIS — F4323 Adjustment disorder with mixed anxiety and depressed mood: Secondary | ICD-10-CM

## 2015-02-23 DIAGNOSIS — B372 Candidiasis of skin and nail: Secondary | ICD-10-CM | POA: Diagnosis not present

## 2015-02-23 DIAGNOSIS — R413 Other amnesia: Secondary | ICD-10-CM | POA: Diagnosis not present

## 2015-02-23 DIAGNOSIS — N390 Urinary tract infection, site not specified: Secondary | ICD-10-CM | POA: Diagnosis not present

## 2015-02-23 NOTE — Assessment & Plan Note (Signed)
stable with Senna II qd, MiraLax dailly, Colace 100mg  bid,  and prn Bisacodyl suppository prn

## 2015-02-23 NOTE — Assessment & Plan Note (Signed)
Left knee flexion contraction

## 2015-02-23 NOTE — Assessment & Plan Note (Signed)
Takes Tamsulosin. No urinary retention.

## 2015-02-23 NOTE — Assessment & Plan Note (Signed)
09/22/14 urine culture: P. Mirabilis >100,000c/ml, 10 day course of Cipro 500mg  bid started 09/21/14 11/05/14 UA gram negative rods, >100,000c/ml, large leukocyte esterase, large blood, empirical Cipro 02/22/15 urine cultrue S. Coag neg Septra DS bid x 7 days started

## 2015-02-23 NOTE — Assessment & Plan Note (Signed)
resolved 

## 2015-02-23 NOTE — Progress Notes (Signed)
Patient ID: William Roach, male   DOB: 1921-01-29, 81 y.o.   MRN: [id number]   Code Status: DNR  No Known Allergies  Chief Complaint  Patient presents with  . Medical Management of Chronic Issues  . Acute Visit    UTI    HPI: Patient is a 81 y.o. male seen in the SNF at [facility] today for evaluation of UTI and chronic medical conditions.  Problem List Items Addressed This Visit    HTN (hypertension)    Permissive bp control with Sbp in 140s.        BPH (benign prostatic hyperplasia)    Takes Tamsulosin. No urinary retention.         Constipation    stable with Senna II qd, MiraLax dailly, Colace 100mg  bid,  and prn Bisacodyl suppository prn       Hypothyroidism    TSH 1.761 12/01/13 06/04/14 TSH 5.533-up Levothyroxine to 59mcg 07/31/14 TSH 1.502 continue Levothyroxine 30mcg daily.  11/16/14 TSH 1.815       Adjustment disorder with mixed anxiety and depressed mood    No behaviors noted and mood seems good-10/16/14 Pharm GDR decreasing Sertraline to 50mg  po qd and continue Depakote 250mg   11/03/14 staff reported the the patient has exhibited symptoms of relapsed depression-may consider increase Zoloft after UTI is fully treated with Cipro.  11/04/14 Zoloft 75mg  -- mood is stable.        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 Namenda started and tolerated.         Edema    Not apparent, continue Furosemide 10mg  daily.        UTI (urinary tract infection) - Primary    09/22/14 urine culture: P. Mirabilis >100,000c/ml, 10 day course of Cipro 500mg  bid started 09/21/14 11/05/14 UA gram negative rods, >100,000c/ml, large leukocyte esterase, large blood, empirical Cipro 02/22/15 urine cultrue S. Coag neg Septra DS bid x 7 days started         Flexion contracture of left knee    Left knee flexion contraction      Skin candidiasis    resolved         Review of Systems:  Review of Systems  Constitutional:  Negative for fever, chills and diaphoresis.  HENT: Positive for hearing loss. Negative for congestion, ear discharge, ear pain, nosebleeds, sore throat and tinnitus.   Eyes: Negative for photophobia, pain, discharge and redness.  Respiratory: Negative for cough, shortness of breath, wheezing and stridor.   Cardiovascular: Positive for leg swelling. Negative for chest pain and palpitations.       Trace LLE  Gastrointestinal: Negative for nausea, vomiting, abdominal pain, diarrhea, constipation and blood in stool.  Endocrine: Negative for polydipsia.  Genitourinary: Positive for frequency. Negative for dysuria, urgency, hematuria and flank pain.  Musculoskeletal: Negative for myalgias, back pain and neck pain.       Spasticity in knees and hips  Skin: Negative for rash.       Excoriated scrotum with small open area on the left side-no s/s of infection.   Allergic/Immunologic: Negative for environmental allergies.  Neurological: Negative for dizziness, tremors, seizures, weakness and headaches.  Hematological: Does not bruise/bleed easily.  Psychiatric/Behavioral: Negative for suicidal ideas and hallucinations. The patient is nervous/anxious.      Past Medical History  Diagnosis Date  . Hypercholesterolemia   . Fatigue   . Basal cell carcinoma, ear     post -- partial pinnectomy left  side with primary closure    . History of kidney stones     x1  . Acute blood loss anemia 06/30/2013  . Adjustment disorder with mixed anxiety and depressed mood 07/04/2013  . CAD (coronary artery disease) 07/04/2011    S/p CABG   . CVA, old, cognitive deficits 12/05/2013  . Dizziness and giddiness 10/07/2013  . Gait difficulty 02/17/2014  . HTN (hypertension) 07/04/2011  . Hyperlipidemia 07/04/2011  . Lack of coordination 11/08/2013  . Memory deficit 07/29/2013  . Numbness in both legs 10/31/2013  . Osteoarthritis of left knee 06/27/2013  . PAD (peripheral artery disease) 07/29/2013  . S/P knee replacement  07/01/2013  . Situational depression 10/17/2013  . Unspecified constipation 07/01/2013  . Unspecified hypothyroidism 07/01/2013     TSH 1.761 12/01/13     . Weakness 12/01/2013   Past Surgical History  Procedure Laterality Date  . Coronary artery bypass graft  2003    x4 --   . Cholecystectomy  2002  . Inguinal hernia repair  09/19/2007  . Ear cyst excision  12/14/2009    Excision of left auricular cyst with primary closure  . Basal cell carcinoma excision  01/14/2010    Wedge resection, partial pinnectomy left side with primary closure      . Tonsillectomy    . Carpal tunnel release    . Cataract surgery Bilateral 06-24-13  . Tonsillectomy    . Cystoscopy    . Total knee arthroplasty Left 06/27/2013    Procedure: LEFT TOTAL KNEE ARTHROPLASTY;  Surgeon: Tobi Bastos, MD;  Location: [facility];  Service: Orthopedics;  Laterality: Left;   Social History:   reports that he quit smoking about 33 years ago. He does not have any smokeless tobacco history on file. He reports that he does not drink alcohol or use illicit drugs.  Family History  Problem Relation Age of Onset  . Parkinsonism Father   . Angina Mother   . Coronary artery disease Brother     with CABG    Medications: Patient's Medications  New Prescriptions   No medications on file  Previous Medications   ASPIRIN 325 MG TABLET    Take 325 mg by mouth 2 (two) times daily.   BISACODYL (DULCOLAX) 10 MG SUPPOSITORY    Place 1 suppository (10 mg total) rectally daily as needed.   DOCUSATE SODIUM (COLACE) 100 MG CAPSULE    Take 100 mg by mouth 2 (two) times daily.   LEVOTHYROXINE (SYNTHROID, LEVOTHROID) 75 MCG TABLET    Take 75 mcg by mouth daily before breakfast.   LORAZEPAM (ATIVAN) 0.5 MG TABLET    0.5 mg every 4 (four) hours as needed.    MEMANTINE HCL ER (NAMENDA XR) 28 MG CP24    Take by mouth.   OXYCODONE (OXY IR/ROXICODONE) 5 MG IMMEDIATE RELEASE TABLET    Take 1-2 tablets (5-10 mg total) by mouth every 3 (three) hours as  needed.   POLYETHYLENE GLYCOL (MIRALAX / GLYCOLAX) PACKET    Take 17 g by mouth daily as needed.   SENNA (SENOKOT) 8.6 MG TABLET    Take 2 tablets by mouth daily.    SERTRALINE (ZOLOFT) 25 MG TABLET    Take 75 mg by mouth daily.    TAMSULOSIN (FLOMAX) 0.4 MG CAPS CAPSULE    Take 1 capsule (0.4 mg total) by mouth daily.  Modified Medications   No medications on file  Discontinued Medications   No medications on file  Physical Exam: Physical Exam  Constitutional: He is oriented to person, place, and time. He appears well-developed and well-nourished. No distress.  generalized weakness. No longer transferring self and ambulates with walker.   HENT:  Head: Normocephalic and atraumatic.  Right Ear: External ear normal.  Left Ear: External ear normal.  Nose: Nose normal.  Mouth/Throat: Oropharynx is clear and moist. No oropharyngeal exudate.  Eyes: Conjunctivae and EOM are normal. Pupils are equal, round, and reactive to light. Right eye exhibits no discharge. Left eye exhibits no discharge. No scleral icterus.  Neck: Normal range of motion. Neck supple. No JVD present. No tracheal deviation present. No thyromegaly present.  Cardiovascular: Normal rate, regular rhythm, normal heart sounds and intact distal pulses.   No murmur heard. Pulmonary/Chest: Effort normal. No stridor. No respiratory distress. He has no wheezes. He has rales. He exhibits no tenderness.  dry rales posterior lower lungs   Abdominal: Soft. Bowel sounds are normal. He exhibits no distension. There is no tenderness. There is no rebound and no guarding.  Genitourinary: Penis normal.  Musculoskeletal: Normal range of motion. He exhibits edema and tenderness.   Left knee is limited of flexion and extension ROM. C/o R+L knee pain.  Only trace edema in the left ankle usually at end of day.  Stiffness in knees and hips  Lymphadenopathy:    He has no cervical adenopathy.  Neurological: He is alert and oriented to person,  place, and time. He has normal reflexes. No cranial nerve deficit. He exhibits normal muscle tone. Coordination abnormal.  Decreased coordination of the RUE-improving.   Skin: Skin is warm and dry. No rash noted. He is not diaphoretic. There is erythema. No pallor.  The left knee surgical incision is healed.  Excoriated scrotum with small open area on the left wo s/s of infection   Psychiatric: His speech is normal. Judgment and thought content normal. His mood appears anxious. He is slowed and withdrawn. Cognition and memory are impaired. He exhibits abnormal recent memory.  Flat affect-improved.    Filed Vitals:   02/23/15 1231  BP: 110/70  Pulse: 60  Temp: 97.2 F (36.2 C)  TempSrc: Tympanic  Resp: 20      Labs reviewed: Basic Metabolic Panel:  Recent Labs  06/04/14 07/30/14 09/21/14 11/04/14 11/16/14  NA 140  --  142 140 140  K 4.1  --  4.0 4.1 4.2  BUN 27*  --  36* 27* 19  CREATININE 0.8  --  1.0 1.3 0.9  TSH 5.53 1.50  --   --  1.82   Liver Function Tests:  Recent Labs  09/21/14  AST 17  ALT 12  ALKPHOS 121   No results for input(s): LIPASE, AMYLASE in the last 8760 hours. No results for input(s): AMMONIA in the last 8760 hours. CBC:  Recent Labs  09/21/14 11/04/14  WBC 7.2 8.1  HGB 13.0* 15.9  HCT 48 48  PLT 186 155   Lipid Panel: No results for input(s): CHOL, HDL, LDLCALC, TRIG, CHOLHDL, LDLDIRECT in the last 8760 hours.  Past Procedures:  10/28/13 CXR minimal cardiomegaly wihtout pulmonary vascular congestion, no pleural effusion, patchy bibasilar pneumonitis.   12/02/13 CT with and without contrast: atrophy and chronic left external capsule infarct. No acute abnormality.    Assessment/Plan UTI (urinary tract infection) 09/22/14 urine culture: P. Mirabilis >100,000c/ml, 10 day course of Cipro 500mg  bid started 09/21/14 11/05/14 UA gram negative rods, >100,000c/ml, large leukocyte esterase, large blood, empirical Cipro 02/22/15 urine cultrue S.  Coag  neg Septra DS bid x 7 days started      HTN (hypertension) Permissive bp control with Sbp in 140s.     BPH (benign prostatic hyperplasia) Takes Tamsulosin. No urinary retention.      Constipation stable with Senna II qd, MiraLax dailly, Colace 100mg  bid,  and prn Bisacodyl suppository prn    Hypothyroidism TSH 1.761 12/01/13 06/04/14 TSH 5.533-up Levothyroxine to 78mcg 07/31/14 TSH 1.502 continue Levothyroxine 42mcg daily.  11/16/14 TSH 1.815    Adjustment disorder with mixed anxiety and depressed mood No behaviors noted and mood seems good-10/16/14 Pharm GDR decreasing Sertraline to 50mg  po qd and continue Depakote 250mg   11/03/14 staff reported the the patient has exhibited symptoms of relapsed depression-may consider increase Zoloft after UTI is fully treated with Cipro.  11/04/14 Zoloft 75mg  -- mood is stable.     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 Namenda started and tolerated.      Edema Not apparent, continue Furosemide 10mg  daily.     Skin candidiasis resolved   Flexion contracture of left knee Left knee flexion contraction     Family/ Staff Communication: observe the patient.   Goals of Care: SNF  Labs/tests ordered: none

## 2015-02-23 NOTE — Assessment & Plan Note (Signed)
No behaviors noted and mood seems good-10/16/14 Pharm GDR decreasing Sertraline to 50mg  po qd and continue Depakote 250mg   11/03/14 staff reported the the patient has exhibited symptoms of relapsed depression-may consider increase Zoloft after UTI is fully treated with Cipro.  11/04/14 Zoloft 75mg  -- mood is stable.

## 2015-02-23 NOTE — Assessment & Plan Note (Signed)
Permissive bp control with Sbp in 140s.

## 2015-02-23 NOTE — Assessment & Plan Note (Signed)
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 Namenda started and tolerated.

## 2015-02-23 NOTE — Assessment & Plan Note (Signed)
Not apparent, continue Furosemide 10mg  daily.

## 2015-02-23 NOTE — Assessment & Plan Note (Signed)
TSH 1.761 12/01/13 06/04/14 TSH 5.533-up Levothyroxine to 52mcg 07/31/14 TSH 1.502 continue Levothyroxine 67mcg daily.  11/16/14 TSH 1.815

## 2015-03-26 ENCOUNTER — Non-Acute Institutional Stay (SKILLED_NURSING_FACILITY): Payer: Medicare Other | Admitting: Nurse Practitioner

## 2015-03-26 ENCOUNTER — Encounter: Payer: Self-pay | Admitting: Nurse Practitioner

## 2015-03-26 DIAGNOSIS — K5900 Constipation, unspecified: Secondary | ICD-10-CM

## 2015-03-26 DIAGNOSIS — N390 Urinary tract infection, site not specified: Secondary | ICD-10-CM | POA: Diagnosis not present

## 2015-03-26 DIAGNOSIS — I10 Essential (primary) hypertension: Secondary | ICD-10-CM | POA: Diagnosis not present

## 2015-03-26 DIAGNOSIS — N400 Enlarged prostate without lower urinary tract symptoms: Secondary | ICD-10-CM | POA: Diagnosis not present

## 2015-03-26 DIAGNOSIS — E039 Hypothyroidism, unspecified: Secondary | ICD-10-CM

## 2015-03-26 DIAGNOSIS — R609 Edema, unspecified: Secondary | ICD-10-CM | POA: Diagnosis not present

## 2015-03-26 DIAGNOSIS — R413 Other amnesia: Secondary | ICD-10-CM | POA: Diagnosis not present

## 2015-03-26 DIAGNOSIS — F4323 Adjustment disorder with mixed anxiety and depressed mood: Secondary | ICD-10-CM

## 2015-03-26 DIAGNOSIS — M1712 Unilateral primary osteoarthritis, left knee: Secondary | ICD-10-CM

## 2015-03-26 NOTE — Assessment & Plan Note (Signed)
TSH 1.761 12/01/13 06/04/14 TSH 5.533-up Levothyroxine to 49mcg 07/31/14 TSH 1.502 continue Levothyroxine 102mcg daily.  11/16/14 TSH 1.815

## 2015-03-26 NOTE — Assessment & Plan Note (Signed)
Permissive bp control with Sbp in 140-150s

## 2015-03-26 NOTE — Progress Notes (Signed)
Patient ID: William Roach, male   DOB: Dec 12, 1920, 80 y.o.   MRN: [id number]   Code Status: DNR  No Known Allergies  Chief Complaint  Patient presents with  . Medical Management of Chronic Issues    HPI: Patient is a 81 y.o. male seen in the SNF at [facility] today for evaluation of chronic medical conditions.  Problem List Items Addressed This Visit    HTN (hypertension) - Primary    Permissive bp control with Sbp in 140-150s       Osteoarthritis of left knee    Left knee flexion contraction       BPH (benign prostatic hyperplasia)    Takes Tamsulosin. No urinary retention.         Constipation    stable with Senna II qd, MiraLax dailly, Colace 100mg  bid,  and prn Bisacodyl suppository prn        Hypothyroidism    TSH 1.761 12/01/13 06/04/14 TSH 5.533-up Levothyroxine to 29mcg 07/31/14 TSH 1.502 continue Levothyroxine 65mcg daily.  11/16/14 TSH 1.815       Adjustment disorder with mixed anxiety and depressed mood    No behaviors noted and mood seems good-10/16/14 Pharm GDR decreasing Sertraline to 50mg  po qd and continue Depakote 250mg   11/03/14 staff reported the the patient has exhibited symptoms of relapsed depression-may consider increase Zoloft after UTI is fully treated with Cipro.  11/04/14 Zoloft 75mg  -- mood is stable.         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 Namenda started and tolerated.        Edema    Not apparent, continue Furosemide 10mg  daily.         UTI (urinary tract infection)    09/22/14 urine culture: P. Mirabilis >100,000c/ml, 10 day course of Cipro 500mg  bid started 09/21/14 11/05/14 UA gram negative rods, >100,000c/ml, large leukocyte esterase, large blood, empirical Cipro 02/22/15 urine cultrue S. Coag neg Septra DS bid x 7 days started  -fully treated         Review of Systems:  Review of Systems  Constitutional: Negative for fever, chills and diaphoresis.  HENT: Positive  for hearing loss. Negative for congestion, ear discharge, ear pain, nosebleeds, sore throat and tinnitus.   Eyes: Negative for photophobia, pain, discharge and redness.  Respiratory: Negative for cough, shortness of breath, wheezing and stridor.   Cardiovascular: Positive for leg swelling. Negative for chest pain and palpitations.       Trace LLE  Gastrointestinal: Negative for nausea, vomiting, abdominal pain, diarrhea, constipation and blood in stool.  Endocrine: Negative for polydipsia.  Genitourinary: Positive for frequency. Negative for dysuria, urgency, hematuria and flank pain.  Musculoskeletal: Negative for myalgias, back pain and neck pain.       Spasticity in knees and hips  Skin: Negative for rash.       Excoriated scrotum with small open area on the left side-no s/s of infection.   Allergic/Immunologic: Negative for environmental allergies.  Neurological: Negative for dizziness, tremors, seizures, weakness and headaches.  Hematological: Does not bruise/bleed easily.  Psychiatric/Behavioral: Negative for suicidal ideas and hallucinations. The patient is nervous/anxious.      Past Medical History  Diagnosis Date  . Hypercholesterolemia   . Fatigue   . Basal cell carcinoma, ear     post -- partial pinnectomy left side with primary closure    . History of kidney stones     x1  . Acute  blood loss anemia 06/30/2013  . Adjustment disorder with mixed anxiety and depressed mood 07/04/2013  . CAD (coronary artery disease) 07/04/2011    S/p CABG   . CVA, old, cognitive deficits 12/05/2013  . Dizziness and giddiness 10/07/2013  . Gait difficulty 02/17/2014  . HTN (hypertension) 07/04/2011  . Hyperlipidemia 07/04/2011  . Lack of coordination 11/08/2013  . Memory deficit 07/29/2013  . Numbness in both legs 10/31/2013  . Osteoarthritis of left knee 06/27/2013  . PAD (peripheral artery disease) 07/29/2013  . S/P knee replacement 07/01/2013  . Situational depression 10/17/2013  . Unspecified  constipation 07/01/2013  . Unspecified hypothyroidism 07/01/2013     TSH 1.761 12/01/13     . Weakness 12/01/2013   Past Surgical History  Procedure Laterality Date  . Coronary artery bypass graft  2003    x4 --   . Cholecystectomy  2002  . Inguinal hernia repair  09/19/2007  . Ear cyst excision  12/14/2009    Excision of left auricular cyst with primary closure  . Basal cell carcinoma excision  01/14/2010    Wedge resection, partial pinnectomy left side with primary closure      . Tonsillectomy    . Carpal tunnel release    . Cataract surgery Bilateral 06-24-13  . Tonsillectomy    . Cystoscopy    . Total knee arthroplasty Left 06/27/2013    Procedure: LEFT TOTAL KNEE ARTHROPLASTY;  Surgeon: Tobi Bastos, MD;  Location: [facility];  Service: Orthopedics;  Laterality: Left;   Social History:   reports that he quit smoking about 33 years ago. He does not have any smokeless tobacco history on file. He reports that he does not drink alcohol or use illicit drugs.  Family History  Problem Relation Age of Onset  . Parkinsonism Father   . Angina Mother   . Coronary artery disease Brother     with CABG    Medications: Patient's Medications  New Prescriptions   No medications on file  Previous Medications   ASPIRIN 325 MG TABLET    Take 325 mg by mouth 2 (two) times daily.   BISACODYL (DULCOLAX) 10 MG SUPPOSITORY    Place 1 suppository (10 mg total) rectally daily as needed.   DOCUSATE SODIUM (COLACE) 100 MG CAPSULE    Take 100 mg by mouth 2 (two) times daily.   LEVOTHYROXINE (SYNTHROID, LEVOTHROID) 75 MCG TABLET    Take 75 mcg by mouth daily before breakfast.   LORAZEPAM (ATIVAN) 0.5 MG TABLET    0.5 mg every 4 (four) hours as needed.    MEMANTINE HCL ER (NAMENDA XR) 28 MG CP24    Take by mouth.   OXYCODONE (OXY IR/ROXICODONE) 5 MG IMMEDIATE RELEASE TABLET    Take 1-2 tablets (5-10 mg total) by mouth every 3 (three) hours as needed.   POLYETHYLENE GLYCOL (MIRALAX / GLYCOLAX) PACKET     Take 17 g by mouth daily as needed.   SENNA (SENOKOT) 8.6 MG TABLET    Take 2 tablets by mouth daily.    SERTRALINE (ZOLOFT) 25 MG TABLET    Take 75 mg by mouth daily.    TAMSULOSIN (FLOMAX) 0.4 MG CAPS CAPSULE    Take 1 capsule (0.4 mg total) by mouth daily.  Modified Medications   No medications on file  Discontinued Medications   No medications on file     Physical Exam: Physical Exam  Constitutional: He is oriented to person, place, and time. He appears well-developed and well-nourished. No  distress.  generalized weakness. No longer transferring self and ambulates with walker.   HENT:  Head: Normocephalic and atraumatic.  Right Ear: External ear normal.  Left Ear: External ear normal.  Nose: Nose normal.  Mouth/Throat: Oropharynx is clear and moist. No oropharyngeal exudate.  Eyes: Conjunctivae and EOM are normal. Pupils are equal, round, and reactive to light. Right eye exhibits no discharge. Left eye exhibits no discharge. No scleral icterus.  Neck: Normal range of motion. Neck supple. No JVD present. No tracheal deviation present. No thyromegaly present.  Cardiovascular: Normal rate, regular rhythm, normal heart sounds and intact distal pulses.   No murmur heard. Pulmonary/Chest: Effort normal. No stridor. No respiratory distress. He has no wheezes. He has rales. He exhibits no tenderness.  dry rales posterior lower lungs   Abdominal: Soft. Bowel sounds are normal. He exhibits no distension. There is no tenderness. There is no rebound and no guarding.  Genitourinary: Penis normal.  Musculoskeletal: Normal range of motion. He exhibits edema and tenderness.   Left knee is limited of flexion and extension ROM. C/o R+L knee pain.  Only trace edema in the left ankle usually at end of day.  Stiffness in knees and hips  Lymphadenopathy:    He has no cervical adenopathy.  Neurological: He is alert and oriented to person, place, and time. He has normal reflexes. No cranial nerve  deficit. He exhibits normal muscle tone. Coordination abnormal.  Decreased coordination of the RUE-improving.   Skin: Skin is warm and dry. No rash noted. He is not diaphoretic. There is erythema. No pallor.  The left knee surgical incision is healed.  Excoriated scrotum with small open area on the left wo s/s of infection   Psychiatric: His speech is normal. Judgment and thought content normal. His mood appears anxious. He is slowed and withdrawn. Cognition and memory are impaired. He exhibits abnormal recent memory.  Flat affect-improved.    Filed Vitals:   03/26/15 1229  BP: 159/62  Pulse: 72  Temp: 97.8 F (36.6 C)  TempSrc: Tympanic  Resp: 21      Labs reviewed: Basic Metabolic Panel:  Recent Labs  06/04/14 07/30/14 09/21/14 11/04/14 11/16/14  NA 140  --  142 140 140  K 4.1  --  4.0 4.1 4.2  BUN 27*  --  36* 27* 19  CREATININE 0.8  --  1.0 1.3 0.9  TSH 5.53 1.50  --   --  1.82   Liver Function Tests:  Recent Labs  09/21/14  AST 17  ALT 12  ALKPHOS 121   No results for input(s): LIPASE, AMYLASE in the last 8760 hours. No results for input(s): AMMONIA in the last 8760 hours. CBC:  Recent Labs  09/21/14 11/04/14  WBC 7.2 8.1  HGB 13.0* 15.9  HCT 48 48  PLT 186 155   Lipid Panel: No results for input(s): CHOL, HDL, LDLCALC, TRIG, CHOLHDL, LDLDIRECT in the last 8760 hours.  Past Procedures:  10/28/13 CXR minimal cardiomegaly wihtout pulmonary vascular congestion, no pleural effusion, patchy bibasilar pneumonitis.   12/02/13 CT with and without contrast: atrophy and chronic left external capsule infarct. No acute abnormality.    Assessment/Plan HTN (hypertension) Permissive bp control with Sbp in 140-150s   Osteoarthritis of left knee Left knee flexion contraction   BPH (benign prostatic hyperplasia) Takes Tamsulosin. No urinary retention.     Constipation stable with Senna II qd, MiraLax dailly, Colace 100mg  bid,  and prn Bisacodyl suppository  prn    Hypothyroidism  TSH 1.761 12/01/13 06/04/14 TSH 5.533-up Levothyroxine to 23mcg 07/31/14 TSH 1.502 continue Levothyroxine 80mcg daily.  11/16/14 TSH 1.815   Adjustment disorder with mixed anxiety and depressed mood No behaviors noted and mood seems good-10/16/14 Pharm GDR decreasing Sertraline to 50mg  po qd and continue Depakote 250mg   11/03/14 staff reported the the patient has exhibited symptoms of relapsed depression-may consider increase Zoloft after UTI is fully treated with Cipro.  11/04/14 Zoloft 75mg  -- mood is stable.     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 Namenda started and tolerated.    Edema Not apparent, continue Furosemide 10mg  daily.     UTI (urinary tract infection) 09/22/14 urine culture: P. Mirabilis >100,000c/ml, 10 day course of Cipro 500mg  bid started 09/21/14 11/05/14 UA gram negative rods, >100,000c/ml, large leukocyte esterase, large blood, empirical Cipro 02/22/15 urine cultrue S. Coag neg Septra DS bid x 7 days started  -fully treated    Family/ Staff Communication: observe the patient.   Goals of Care: SNF  Labs/tests ordered: none

## 2015-03-26 NOTE — Assessment & Plan Note (Signed)
Takes Tamsulosin. No urinary retention.

## 2015-03-26 NOTE — Assessment & Plan Note (Signed)
09/22/14 urine culture: P. Mirabilis >100,000c/ml, 10 day course of Cipro 500mg  bid started 09/21/14 11/05/14 UA gram negative rods, >100,000c/ml, large leukocyte esterase, large blood, empirical Cipro 02/22/15 urine cultrue S. Coag neg Septra DS bid x 7 days started  -fully treated

## 2015-03-26 NOTE — Assessment & Plan Note (Signed)
No behaviors noted and mood seems good-10/16/14 Pharm GDR decreasing Sertraline to 50mg  po qd and continue Depakote 250mg   11/03/14 staff reported the the patient has exhibited symptoms of relapsed depression-may consider increase Zoloft after UTI is fully treated with Cipro.  11/04/14 Zoloft 75mg  -- mood is stable.

## 2015-03-26 NOTE — Assessment & Plan Note (Signed)
Left knee flexion contraction

## 2015-03-26 NOTE — Assessment & Plan Note (Signed)
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 Namenda started and tolerated.

## 2015-03-26 NOTE — Assessment & Plan Note (Signed)
stable with Senna II qd, MiraLax dailly, Colace 100mg  bid,  and prn Bisacodyl suppository prn

## 2015-03-26 NOTE — Assessment & Plan Note (Signed)
Not apparent, continue Furosemide 10mg  daily.

## 2015-03-31 LAB — HEPATIC FUNCTION PANEL
ALT: 18 U/L (ref 10–40)
AST: 22 U/L (ref 14–40)
Alkaline Phosphatase: 163 U/L — AB (ref 25–125)
Bilirubin, Total: 0.4 mg/dL

## 2015-03-31 LAB — BASIC METABOLIC PANEL
BUN: 21 mg/dL (ref 4–21)
Creatinine: 1 mg/dL (ref 0.6–1.3)
Glucose: 82 mg/dL
Potassium: 4.2 mmol/L (ref 3.4–5.3)
SODIUM: 142 mmol/L (ref 137–147)

## 2015-03-31 LAB — CBC AND DIFFERENTIAL
HEMATOCRIT: 40 % — AB (ref 41–53)
Hemoglobin: 13 g/dL — AB (ref 13.5–17.5)
Platelets: 262 10*3/uL (ref 150–399)
WBC: 8.2 10^3/mL

## 2015-04-02 ENCOUNTER — Other Ambulatory Visit: Payer: Self-pay | Admitting: Nurse Practitioner

## 2015-04-02 DIAGNOSIS — R609 Edema, unspecified: Secondary | ICD-10-CM

## 2015-04-15 LAB — TSH: TSH: 1.44 u[IU]/mL (ref 0.41–5.90)

## 2015-04-16 ENCOUNTER — Other Ambulatory Visit: Payer: Self-pay | Admitting: Nurse Practitioner

## 2015-05-05 ENCOUNTER — Non-Acute Institutional Stay (SKILLED_NURSING_FACILITY): Payer: Medicare Other | Admitting: Adult Health

## 2015-05-05 DIAGNOSIS — I6931 Cognitive deficits following cerebral infarction: Secondary | ICD-10-CM | POA: Diagnosis not present

## 2015-05-05 DIAGNOSIS — I69319 Unspecified symptoms and signs involving cognitive functions following cerebral infarction: Secondary | ICD-10-CM

## 2015-05-19 NOTE — Progress Notes (Signed)
Patient ID: William Roach, male   DOB: 1921/06/24, 81 y.o.   MRN: [id number]   Facility:  [facility]       No Known Allergies  Chief Complaint  Patient presents with  . Medical Management of Chronic Issues  . Acute Visit    end of life issues     HPI:   He is a long term resident of this facility being seen for the management of his chronic illnesses.  He is at end of life. His family desires for comfort care; and does not want any aggressive interventions. He is not responding to verbal stimuli.   Past Medical History  Diagnosis Date  . Hypercholesterolemia   . Fatigue   . Basal cell carcinoma, ear     post -- partial pinnectomy left side with primary closure    . History of kidney stones     x1  . Acute blood loss anemia 06/30/2013  . Adjustment disorder with mixed anxiety and depressed mood 07/04/2013  . CAD (coronary artery disease) 07/04/2011    S/p CABG   . CVA, old, cognitive deficits 12/05/2013  . Dizziness and giddiness 10/07/2013  . Gait difficulty 02/17/2014  . HTN (hypertension) 07/04/2011  . Hyperlipidemia 07/04/2011  . Lack of coordination 11/08/2013  . Memory deficit 07/29/2013  . Numbness in both legs 10/31/2013  . Osteoarthritis of left knee 06/27/2013  . PAD (peripheral artery disease) 07/29/2013  . S/P knee replacement 07/01/2013  . Situational depression 10/17/2013  . Unspecified constipation 07/01/2013  . Unspecified hypothyroidism 07/01/2013     TSH 1.761 12/01/13     . Weakness 12/01/2013    Past Surgical History  Procedure Laterality Date  . Coronary artery bypass graft  2003    x4 --   . Cholecystectomy  2002  . Inguinal hernia repair  09/19/2007  . Ear cyst excision  12/14/2009    Excision of left auricular cyst with primary closure  . Basal cell carcinoma excision  01/14/2010    Wedge resection, partial pinnectomy left side with primary closure      . Tonsillectomy    . Carpal tunnel release    . Cataract surgery Bilateral 06-24-13  .  Tonsillectomy    . Cystoscopy    . Total knee arthroplasty Left 06/27/2013    Procedure: LEFT TOTAL KNEE ARTHROPLASTY;  Surgeon: Tobi Bastos, MD;  Location: [facility];  Service: Orthopedics;  Laterality: Left;    VITAL SIGNS BP 128/80 mmHg  Pulse 80  Patient's Medications  New Prescriptions   No medications on file  Previous Medications   ASPIRIN 325 MG TABLET    Take 325 mg by mouth 2 (two) times daily.   BISACODYL (DULCOLAX) 10 MG SUPPOSITORY    Place 1 suppository (10 mg total) rectally daily as needed.   DOCUSATE SODIUM (COLACE) 100 MG CAPSULE    Take 100 mg by mouth 2 (two) times daily.   LEVOTHYROXINE (SYNTHROID, LEVOTHROID) 75 MCG TABLET    Take 75 mcg by mouth daily before breakfast.   LORAZEPAM (ATIVAN) 0.5 MG TABLET    0.5 mg every 4 (four) hours as needed.    MEMANTINE HCL ER (NAMENDA XR) 28 MG CP24    Take by mouth.   OXYCODONE (OXY IR/ROXICODONE) 5 MG IMMEDIATE RELEASE TABLET    Take 1-2 tablets (5-10 mg total) by mouth every 3 (three) hours as needed.   POLYETHYLENE GLYCOL (MIRALAX / GLYCOLAX) PACKET    Take 17 g by  mouth daily as needed.   SENNA (SENOKOT) 8.6 MG TABLET    Take 2 tablets by mouth daily.    SERTRALINE (ZOLOFT) 25 MG TABLET    Take 75 mg by mouth daily.    TAMSULOSIN (FLOMAX) 0.4 MG CAPS CAPSULE    Take 1 capsule (0.4 mg total) by mouth daily.  Modified Medications   No medications on file  Discontinued Medications   No medications on file     SIGNIFICANT DIAGNOSTIC EXAMS    Review of Systems  Unable to perform ROS: Patient nonverbal      Physical Exam  Constitutional: No distress.  Eyes: Conjunctivae are normal.  Neck: Neck supple.  Cardiovascular: Normal rate, regular rhythm and intact distal pulses.   Respiratory: Effort normal and breath sounds normal. No respiratory distress. He has no wheezes.  GI: Soft. Bowel sounds are normal. He exhibits no distension.  Musculoskeletal: He exhibits no edema.  Skin: Skin is warm and dry. He is  not diaphoretic.       ASSESSMENT/ PLAN:  Old cva Dementia  Will stop lasix; k+; depakote flomax miralax Will continue to focus upon his comfort and will monitor   Ok Edwards NP [facility]  Contact [phone] Monday through Friday 8am- 5pm  After hours call [phone]

## 2015-06-03 DEATH — deceased

## 2015-07-18 IMAGING — CR DG KNEE COMPLETE 4+V*L*
3 series · 3 of 3 positions shown · non-contrast
Comparison: None.

CLINICAL DATA: Left knee pain

LEFT KNEE - COMPLETE 4+ VIEW

[AP]
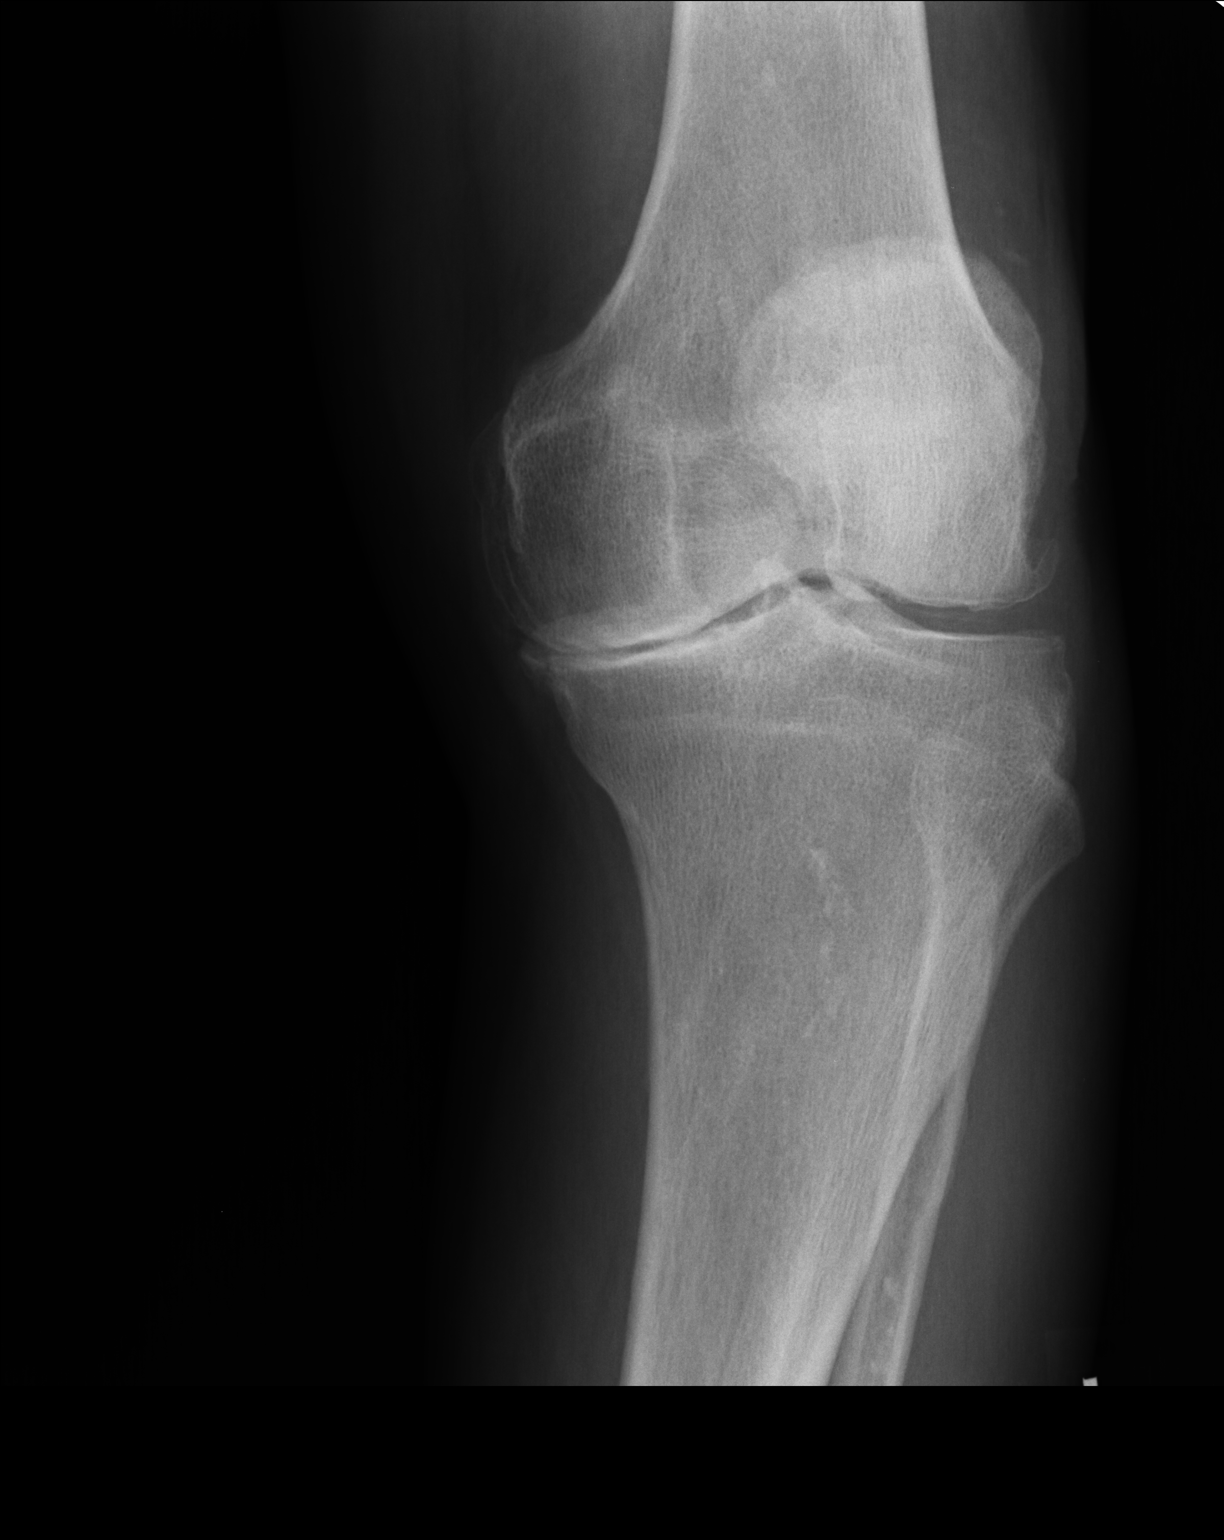

[ap int rot]
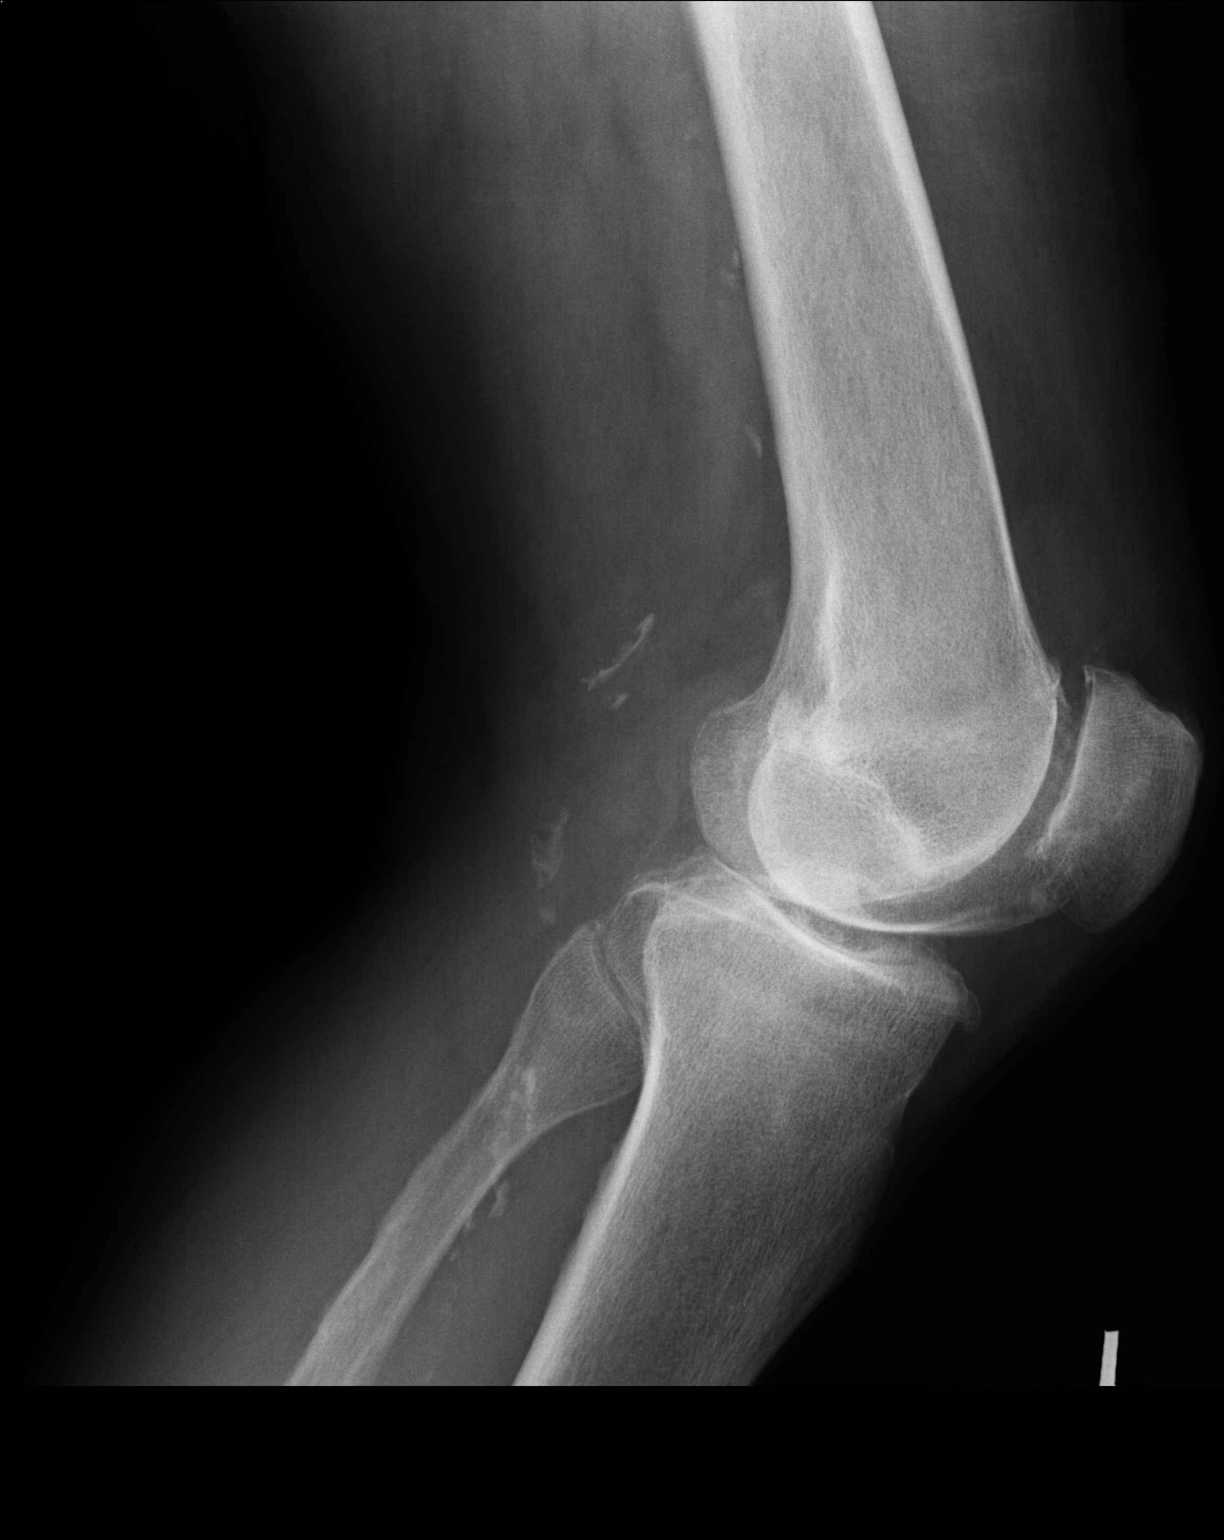

[lateral]
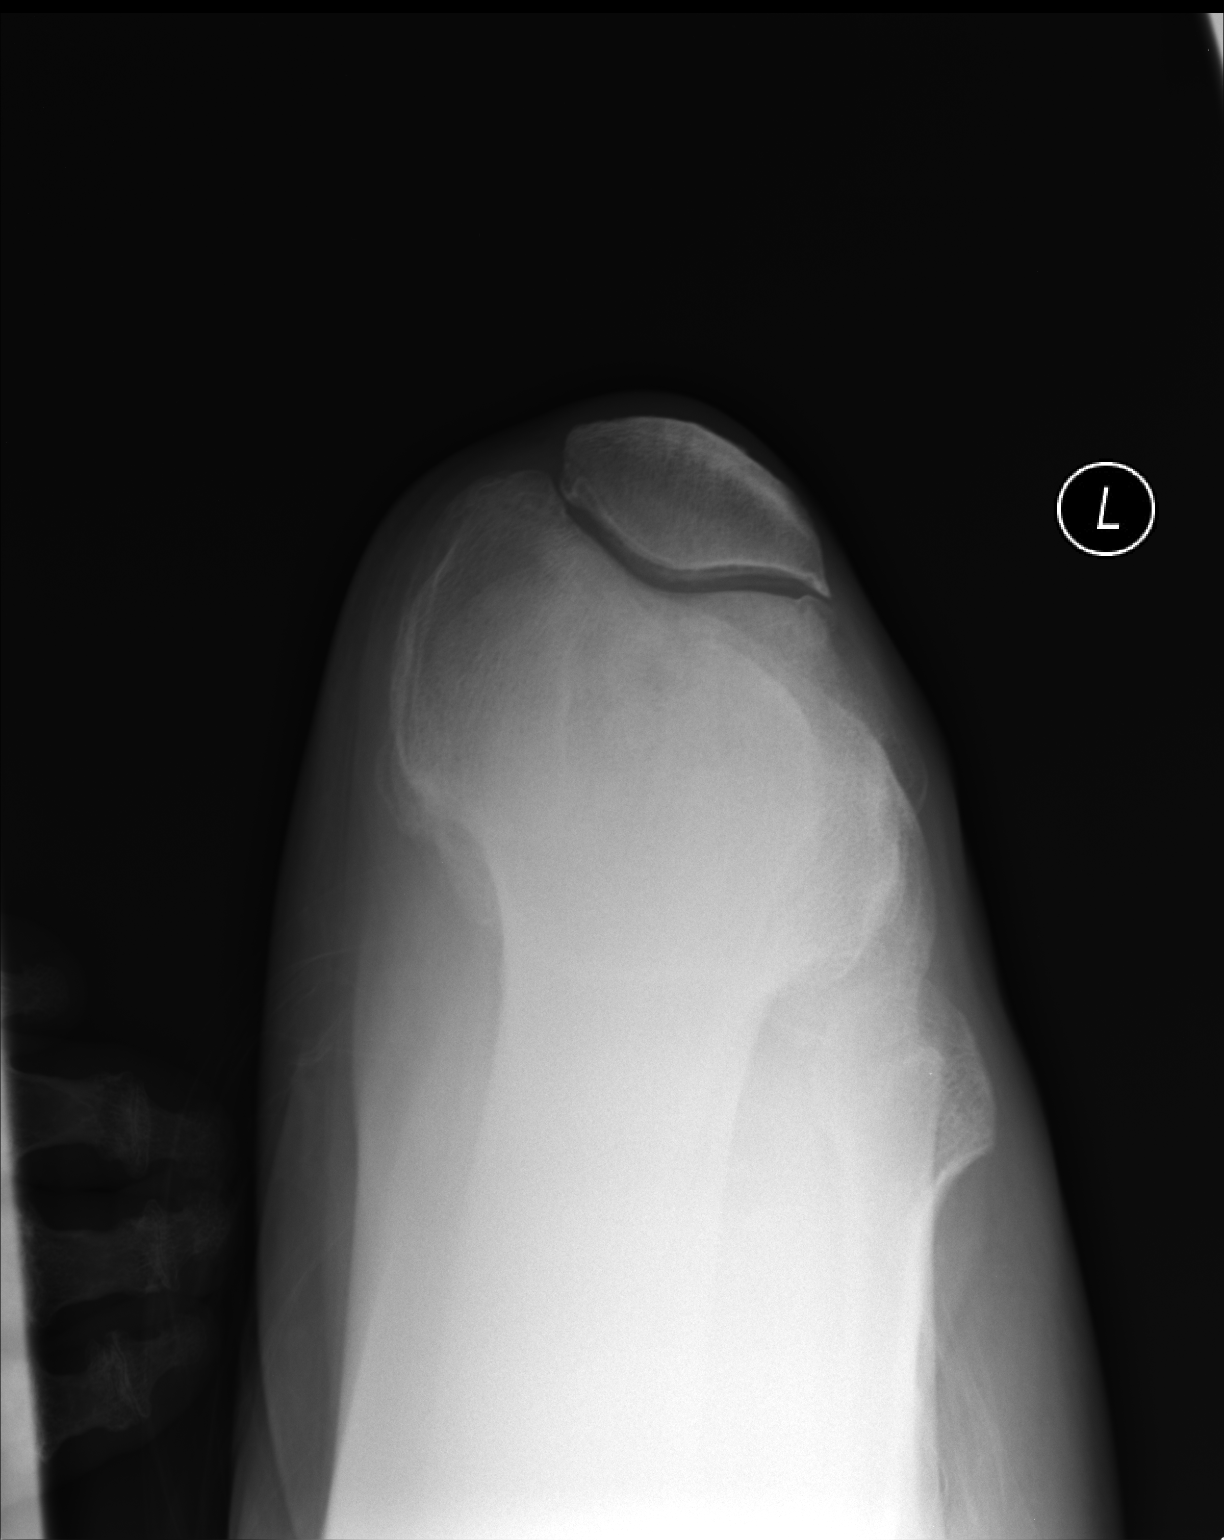

[3 of 3 positions shown; findings below may reference images not displayed]

FINDINGS: No fracture or dislocation.  Moderate to severe
tricompartmental degenerative change, worse within the medial
compartment with subchondral sclerosis, osteophytosis and joint
space loss with associated mild varus angulation.  There is a
minimal amount of chondrocalcinosis within the lateral compartment
joint space.  No suprapatellar joint effusion.  Vascular
calcifications.
IMPRESSION: 1.  No acute findings.
2.  Moderate to severe tricompartmental degenerative change.
3.  Chondrocalcinosis suggestive of CPPD.

Clinically significant discrepancy from primary report, if
provided: None

## 2015-08-13 IMAGING — CR DG CHEST 2V
2 series · 2 of 2 positions shown · non-contrast
Comparison: 01/14/2010

CLINICAL DATA: Preop. History of CABG in 8001. Hypertension.

EXAM:
CHEST  2 VIEW

[w chest pa]
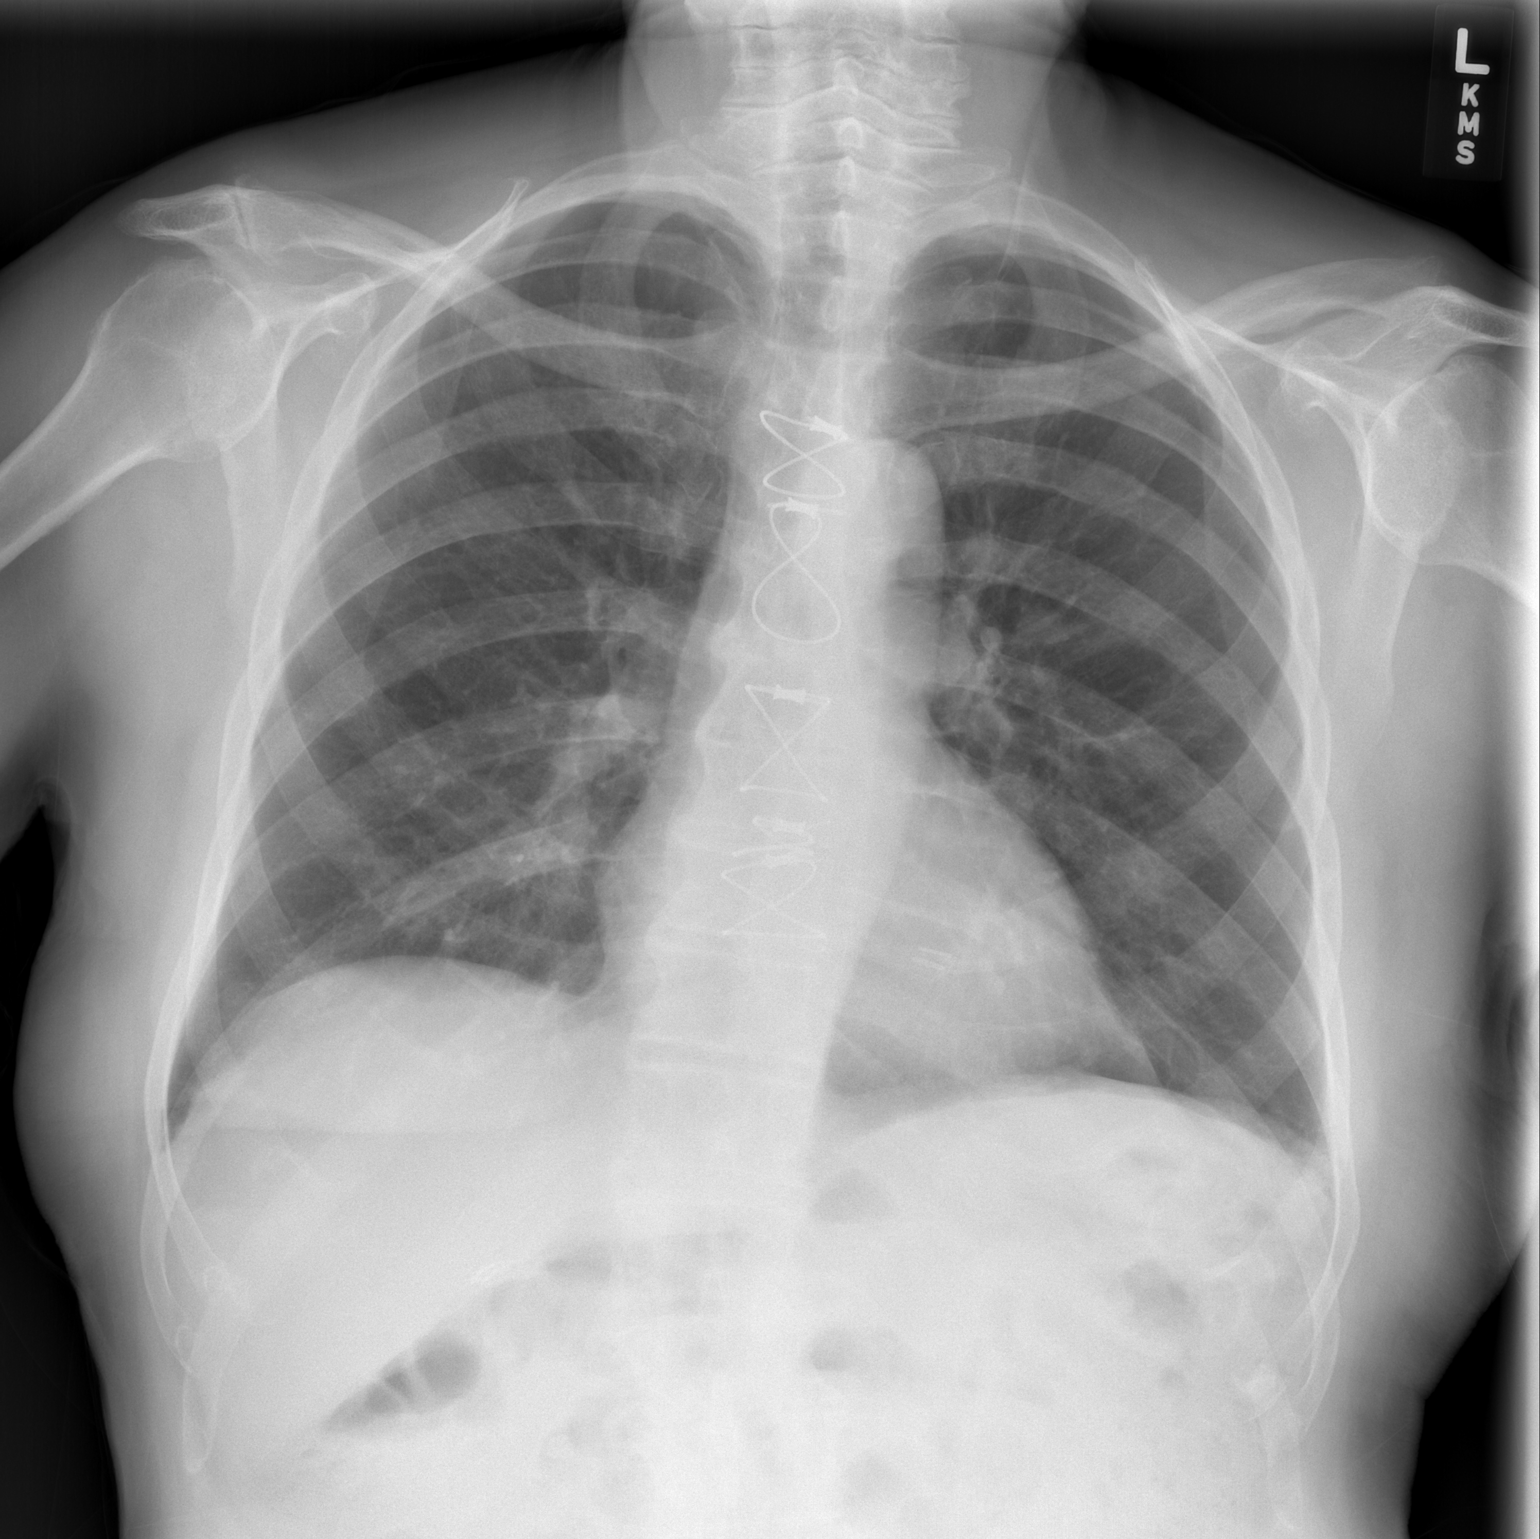

[w chest lat]
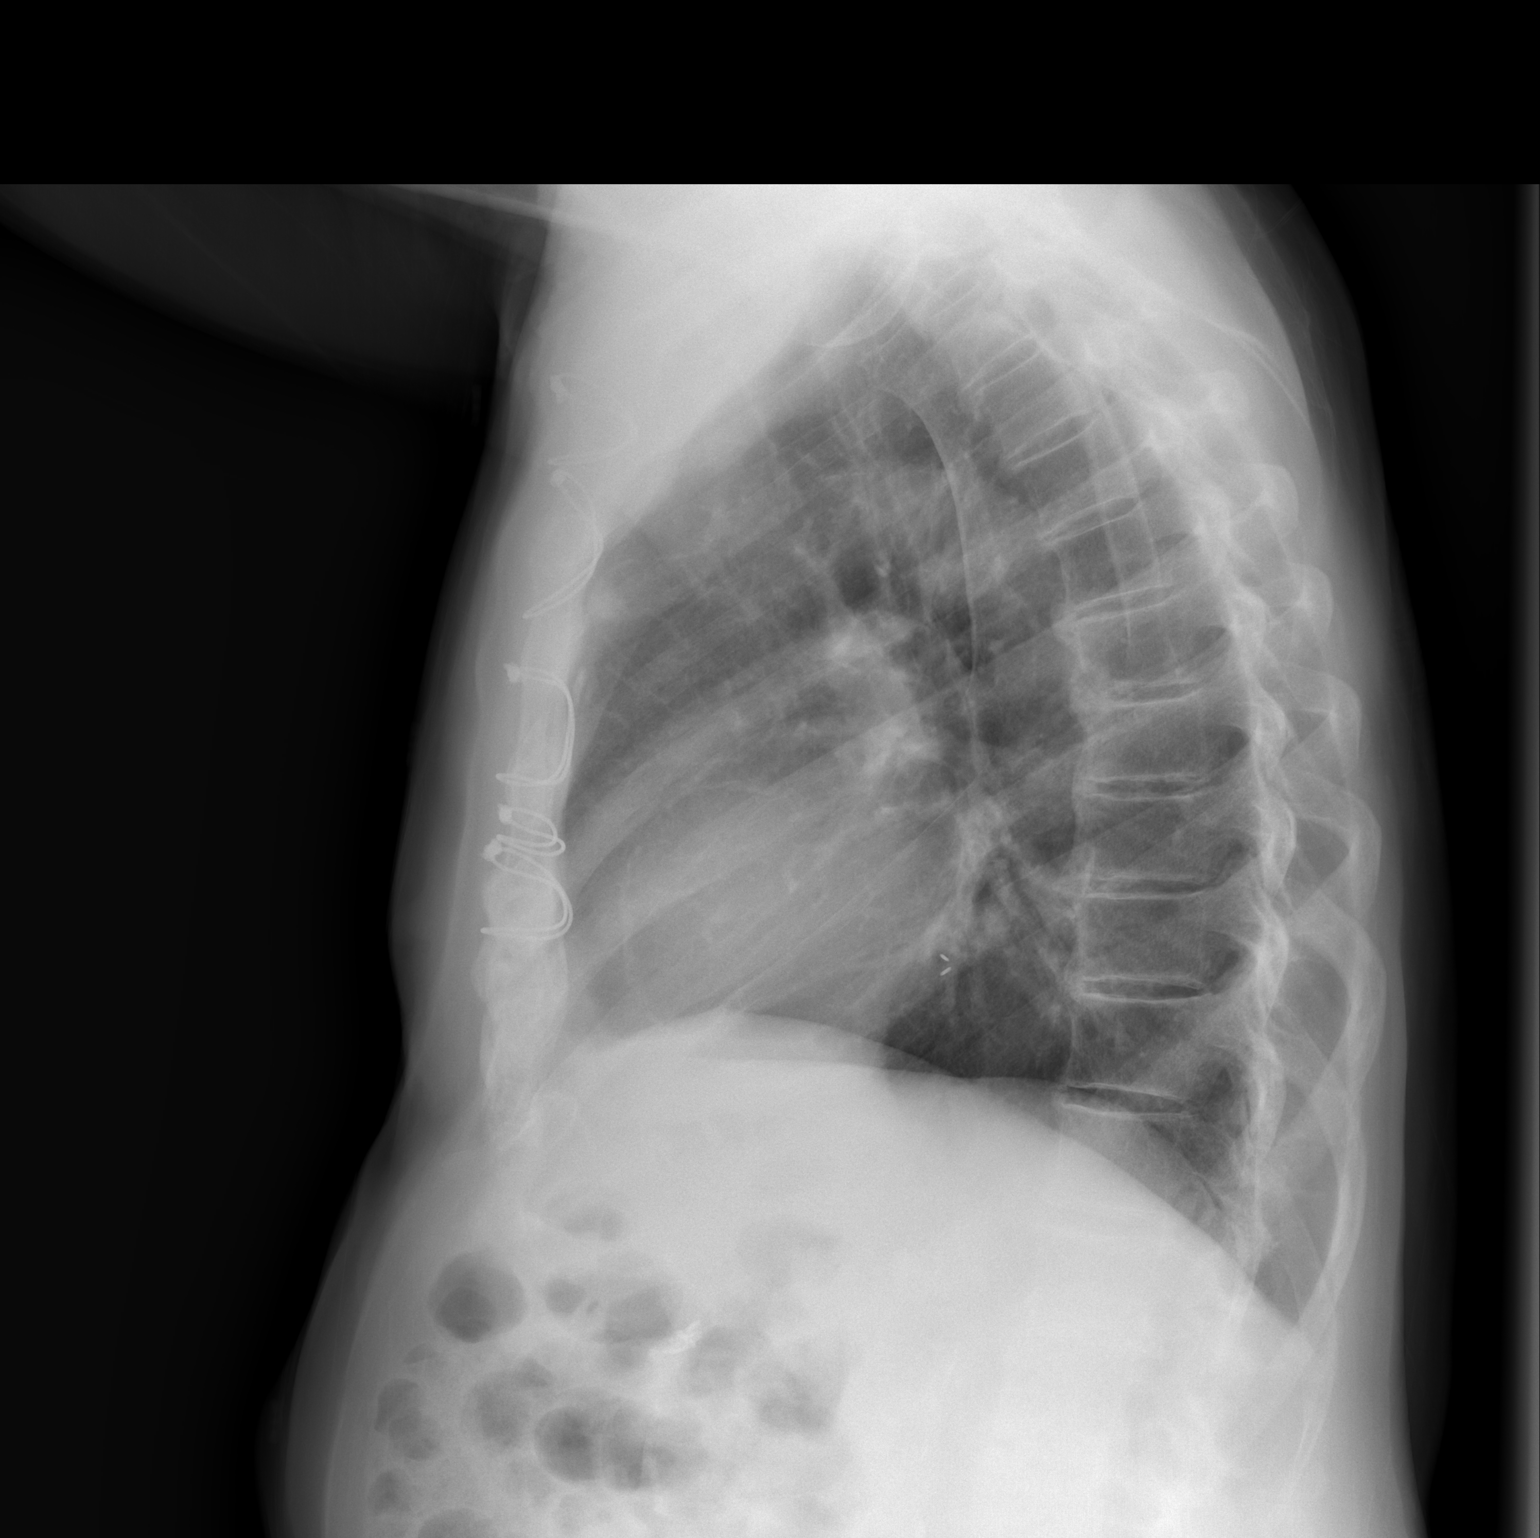

[2 of 2 positions shown; findings below may reference images not displayed]

FINDINGS: The patient has had median sternotomy. Heart size is normal. Lungs
are clear. No focal consolidations, pleural effusions, or pulmonary
edema. Degenerative changes are seen in the spine. Surgical clips
are present in the right upper quadrant of the abdomen.
IMPRESSION: No active cardiopulmonary disease.
